# Patient Record
Sex: Male | Born: 1965 | Race: White | Hispanic: No | Marital: Married | Smoking: Former smoker
Health system: Southern US, Community
[De-identification: ages and names within clinical notes are randomized; demographics above are authoritative.]

## PROBLEM LIST (undated history)

## (undated) DIAGNOSIS — G5710 Meralgia paresthetica, unspecified lower limb: Secondary | ICD-10-CM

## (undated) DIAGNOSIS — K279 Peptic ulcer, site unspecified, unspecified as acute or chronic, without hemorrhage or perforation: Secondary | ICD-10-CM

## (undated) DIAGNOSIS — J019 Acute sinusitis, unspecified: Secondary | ICD-10-CM

## (undated) DIAGNOSIS — L989 Disorder of the skin and subcutaneous tissue, unspecified: Secondary | ICD-10-CM

## (undated) DIAGNOSIS — K219 Gastro-esophageal reflux disease without esophagitis: Secondary | ICD-10-CM

## (undated) DIAGNOSIS — F411 Generalized anxiety disorder: Secondary | ICD-10-CM

## (undated) DIAGNOSIS — C801 Malignant (primary) neoplasm, unspecified: Secondary | ICD-10-CM

## (undated) DIAGNOSIS — M545 Low back pain: Secondary | ICD-10-CM

## (undated) DIAGNOSIS — I10 Essential (primary) hypertension: Secondary | ICD-10-CM

## (undated) DIAGNOSIS — E119 Type 2 diabetes mellitus without complications: Secondary | ICD-10-CM

## (undated) DIAGNOSIS — G5600 Carpal tunnel syndrome, unspecified upper limb: Secondary | ICD-10-CM

## (undated) DIAGNOSIS — M1990 Unspecified osteoarthritis, unspecified site: Secondary | ICD-10-CM

## (undated) DIAGNOSIS — F528 Other sexual dysfunction not due to a substance or known physiological condition: Secondary | ICD-10-CM

## (undated) DIAGNOSIS — E785 Hyperlipidemia, unspecified: Secondary | ICD-10-CM

## (undated) DIAGNOSIS — T7840XA Allergy, unspecified, initial encounter: Secondary | ICD-10-CM

## (undated) DIAGNOSIS — F329 Major depressive disorder, single episode, unspecified: Secondary | ICD-10-CM

## (undated) DIAGNOSIS — M542 Cervicalgia: Secondary | ICD-10-CM

## (undated) HISTORY — DX: Type 2 diabetes mellitus without complications: E11.9

## (undated) HISTORY — DX: Major depressive disorder, single episode, unspecified: F32.9

## (undated) HISTORY — DX: Carpal tunnel syndrome, unspecified upper limb: G56.00

## (undated) HISTORY — DX: Gastro-esophageal reflux disease without esophagitis: K21.9

## (undated) HISTORY — PX: FRACTURE SURGERY: SHX138

## (undated) HISTORY — DX: Hyperlipidemia, unspecified: E78.5

## (undated) HISTORY — DX: Peptic ulcer, site unspecified, unspecified as acute or chronic, without hemorrhage or perforation: K27.9

## (undated) HISTORY — DX: Essential (primary) hypertension: I10

## (undated) HISTORY — DX: Low back pain: M54.5

## (undated) HISTORY — DX: Generalized anxiety disorder: F41.1

## (undated) HISTORY — DX: Disorder of the skin and subcutaneous tissue, unspecified: L98.9

## (undated) HISTORY — DX: Acute sinusitis, unspecified: J01.90

## (undated) HISTORY — DX: Allergy, unspecified, initial encounter: T78.40XA

## (undated) HISTORY — DX: Other sexual dysfunction not due to a substance or known physiological condition: F52.8

## (undated) HISTORY — DX: Meralgia paresthetica, unspecified lower limb: G57.10

## (undated) HISTORY — DX: Malignant (primary) neoplasm, unspecified: C80.1

## (undated) HISTORY — DX: Unspecified osteoarthritis, unspecified site: M19.90

## (undated) HISTORY — PX: WISDOM TOOTH EXTRACTION: SHX21

## (undated) HISTORY — DX: Cervicalgia: M54.2

---

## 1983-08-16 HISTORY — PX: OTHER SURGICAL HISTORY: SHX169

## 2002-05-06 ENCOUNTER — Encounter: Admission: RE | Admit: 2002-05-06 | Discharge: 2002-05-29 | Payer: Self-pay | Admitting: Internal Medicine

## 2002-06-18 ENCOUNTER — Encounter: Admission: RE | Admit: 2002-06-18 | Discharge: 2002-09-16 | Payer: Self-pay | Admitting: Internal Medicine

## 2002-12-17 ENCOUNTER — Encounter: Admission: RE | Admit: 2002-12-17 | Discharge: 2003-03-17 | Payer: Self-pay | Admitting: Internal Medicine

## 2003-04-29 ENCOUNTER — Encounter: Admission: RE | Admit: 2003-04-29 | Discharge: 2003-07-28 | Payer: Self-pay | Admitting: Internal Medicine

## 2005-04-13 ENCOUNTER — Ambulatory Visit: Payer: Self-pay | Admitting: Internal Medicine

## 2005-05-11 ENCOUNTER — Ambulatory Visit: Payer: Self-pay | Admitting: Internal Medicine

## 2005-05-17 ENCOUNTER — Ambulatory Visit: Payer: Self-pay | Admitting: Internal Medicine

## 2006-07-17 ENCOUNTER — Ambulatory Visit: Payer: Self-pay | Admitting: Internal Medicine

## 2006-07-17 LAB — CONVERTED CEMR LAB
ALT: 22 units/L (ref 0–40)
AST: 9 units/L (ref 0–37)
Albumin: 4.1 g/dL (ref 3.5–5.2)
Basophils Absolute: 0 10*3/uL (ref 0.0–0.1)
Creatinine, Ser: 0.9 mg/dL (ref 0.4–1.5)
Eosinophil percent: 2.7 % (ref 0.0–5.0)
GFR calc non Af Amer: 98 mL/min
Glomerular Filtration Rate, Af Am: 119 mL/min/{1.73_m2}
HCT: 50.9 % (ref 39.0–52.0)
LDL DIRECT: 41.6 mg/dL
Leukocytes, UA: NEGATIVE
MCHC: 33.8 g/dL (ref 30.0–36.0)
Neutro Abs: 2.8 10*3/uL (ref 1.4–7.7)
Nitrite: NEGATIVE
Platelets: 245 10*3/uL (ref 150–400)
RBC: 5.75 M/uL (ref 4.22–5.81)
RDW: 12 % (ref 11.5–14.6)
Sodium: 141 meq/L (ref 135–145)
Specific Gravity, Urine: <=1.005 (ref 1.000–1.03)
Total Bilirubin: 1.1 mg/dL (ref 0.3–1.2)
Total Protein, Urine: NEGATIVE mg/dL
Urine Glucose: >=1000 mg/dL — CR

## 2006-07-20 ENCOUNTER — Ambulatory Visit: Payer: Self-pay | Admitting: Internal Medicine

## 2006-08-28 ENCOUNTER — Ambulatory Visit: Payer: Self-pay | Admitting: Internal Medicine

## 2006-08-28 LAB — CONVERTED CEMR LAB
ALT: 22 units/L (ref 0–40)
AST: 16 units/L (ref 0–37)
Alkaline Phosphatase: 46 units/L (ref 39–117)
BUN: 10 mg/dL (ref 6–23)
Calcium: 9.4 mg/dL (ref 8.4–10.5)
GFR calc non Af Amer: 87 mL/min
Hgb A1c MFr Bld: 10.9 % — ABNORMAL HIGH (ref 4.6–6.0)
Potassium: 4.3 meq/L (ref 3.5–5.1)
Total Protein: 6.3 g/dL (ref 6.0–8.3)

## 2006-08-30 ENCOUNTER — Ambulatory Visit: Payer: Self-pay | Admitting: Internal Medicine

## 2006-09-27 ENCOUNTER — Ambulatory Visit: Payer: Self-pay | Admitting: Internal Medicine

## 2006-09-27 LAB — CONVERTED CEMR LAB
Calcium: 8.9 mg/dL (ref 8.4–10.5)
Chloride: 101 meq/L (ref 96–112)
GFR calc Af Amer: 119 mL/min
GFR calc non Af Amer: 98 mL/min
Hgb A1c MFr Bld: 8.1 % — ABNORMAL HIGH (ref 4.6–6.0)
LDL Cholesterol: 77 mg/dL (ref 0–99)
Sodium: 140 meq/L (ref 135–145)
Total CHOL/HDL Ratio: 4.3
VLDL: 37 mg/dL (ref 0–40)

## 2006-10-02 ENCOUNTER — Ambulatory Visit: Payer: Self-pay | Admitting: Internal Medicine

## 2006-12-29 ENCOUNTER — Ambulatory Visit: Payer: Self-pay | Admitting: Internal Medicine

## 2006-12-29 LAB — CONVERTED CEMR LAB
BUN: 16 mg/dL (ref 6–23)
CO2: 28 meq/L (ref 19–32)
Chloride: 106 meq/L (ref 96–112)
Creatinine, Ser: 0.9 mg/dL (ref 0.4–1.5)
HDL: 33.5 mg/dL — ABNORMAL LOW (ref >39.0)
Hgb A1c MFr Bld: 5.8 % (ref 4.6–6.0)
Potassium: 3.9 meq/L (ref 3.5–5.1)
Triglycerides: 106 mg/dL (ref 0–149)

## 2007-01-04 ENCOUNTER — Ambulatory Visit: Payer: Self-pay | Admitting: Internal Medicine

## 2007-07-05 DIAGNOSIS — F329 Major depressive disorder, single episode, unspecified: Secondary | ICD-10-CM

## 2007-07-05 DIAGNOSIS — E119 Type 2 diabetes mellitus without complications: Secondary | ICD-10-CM

## 2007-07-05 DIAGNOSIS — G5600 Carpal tunnel syndrome, unspecified upper limb: Secondary | ICD-10-CM

## 2007-07-05 DIAGNOSIS — K279 Peptic ulcer, site unspecified, unspecified as acute or chronic, without hemorrhage or perforation: Secondary | ICD-10-CM

## 2007-07-05 DIAGNOSIS — I152 Hypertension secondary to endocrine disorders: Secondary | ICD-10-CM | POA: Insufficient documentation

## 2007-07-05 DIAGNOSIS — I10 Essential (primary) hypertension: Secondary | ICD-10-CM | POA: Insufficient documentation

## 2007-07-05 DIAGNOSIS — F411 Generalized anxiety disorder: Secondary | ICD-10-CM

## 2007-07-05 DIAGNOSIS — E785 Hyperlipidemia, unspecified: Secondary | ICD-10-CM | POA: Insufficient documentation

## 2007-07-05 DIAGNOSIS — F3289 Other specified depressive episodes: Secondary | ICD-10-CM

## 2007-07-05 HISTORY — DX: Major depressive disorder, single episode, unspecified: F32.9

## 2007-07-05 HISTORY — DX: Generalized anxiety disorder: F41.1

## 2007-07-05 HISTORY — DX: Other specified depressive episodes: F32.89

## 2007-07-05 HISTORY — DX: Type 2 diabetes mellitus without complications: E11.9

## 2007-07-05 HISTORY — DX: Peptic ulcer, site unspecified, unspecified as acute or chronic, without hemorrhage or perforation: K27.9

## 2007-07-05 HISTORY — DX: Hyperlipidemia, unspecified: E78.5

## 2007-07-05 HISTORY — DX: Carpal tunnel syndrome, unspecified upper limb: G56.00

## 2007-07-05 HISTORY — DX: Essential (primary) hypertension: I10

## 2007-07-11 ENCOUNTER — Telehealth (INDEPENDENT_AMBULATORY_CARE_PROVIDER_SITE_OTHER): Payer: Self-pay | Admitting: *Deleted

## 2007-07-11 ENCOUNTER — Ambulatory Visit: Payer: Self-pay | Admitting: Internal Medicine

## 2007-07-11 DIAGNOSIS — F528 Other sexual dysfunction not due to a substance or known physiological condition: Secondary | ICD-10-CM

## 2007-07-11 DIAGNOSIS — K219 Gastro-esophageal reflux disease without esophagitis: Secondary | ICD-10-CM

## 2007-07-11 DIAGNOSIS — M545 Low back pain, unspecified: Secondary | ICD-10-CM

## 2007-07-11 HISTORY — DX: Low back pain, unspecified: M54.50

## 2007-07-11 HISTORY — DX: Gastro-esophageal reflux disease without esophagitis: K21.9

## 2007-07-11 HISTORY — DX: Other sexual dysfunction not due to a substance or known physiological condition: F52.8

## 2007-07-11 LAB — CONVERTED CEMR LAB
ALT: 26 units/L (ref 0–53)
AST: 17 units/L (ref 0–37)
Albumin: 4.1 g/dL (ref 3.5–5.2)
Basophils Absolute: 0 10*3/uL (ref 0.0–0.1)
Bilirubin Urine: NEGATIVE
Bilirubin, Direct: 0.1 mg/dL (ref 0.0–0.3)
Calcium: 9.1 mg/dL (ref 8.4–10.5)
Chloride: 107 meq/L (ref 96–112)
Cholesterol: 164 mg/dL (ref 0–200)
Creatinine,U: 255.3 mg/dL
Eosinophils Absolute: 0.1 10*3/uL (ref 0.0–0.6)
Eosinophils Relative: 2.3 % (ref 0.0–5.0)
GFR calc Af Amer: 118 mL/min
GFR calc non Af Amer: 98 mL/min
Glucose, Bld: 108 mg/dL — ABNORMAL HIGH (ref 70–99)
Hemoglobin, Urine: NEGATIVE
Hgb A1c MFr Bld: 6.4 % — ABNORMAL HIGH (ref 4.6–6.0)
Ketones, ur: NEGATIVE mg/dL
LDL Cholesterol: 113 mg/dL — ABNORMAL HIGH (ref 0–99)
Lymphocytes Relative: 22.6 % (ref 12.0–46.0)
MCHC: 35 g/dL (ref 30.0–36.0)
MCV: 90.8 fL (ref 78.0–100.0)
Microalb Creat Ratio: 3.1 mg/g (ref 0.0–30.0)
Monocytes Relative: 13 % — ABNORMAL HIGH (ref 3.0–11.0)
Neutro Abs: 3.2 10*3/uL (ref 1.4–7.7)
Nitrite: NEGATIVE
PSA: 0.28 ng/mL (ref 0.10–4.00)
Platelets: 261 10*3/uL (ref 150–400)
RBC: 5.49 M/uL (ref 4.22–5.81)
TSH: 1.09 microintl units/mL (ref 0.35–5.50)
Total CHOL/HDL Ratio: 5.3
Triglycerides: 97 mg/dL (ref 0–149)
Urine Glucose: NEGATIVE mg/dL
WBC: 5 10*3/uL (ref 4.5–10.5)

## 2007-08-14 ENCOUNTER — Telehealth: Payer: Self-pay | Admitting: Internal Medicine

## 2008-02-26 ENCOUNTER — Encounter: Payer: Self-pay | Admitting: Internal Medicine

## 2008-02-27 ENCOUNTER — Telehealth: Payer: Self-pay | Admitting: Internal Medicine

## 2008-03-13 ENCOUNTER — Ambulatory Visit: Payer: Self-pay | Admitting: Internal Medicine

## 2008-03-13 LAB — CONVERTED CEMR LAB
Chloride: 105 meq/L (ref 96–112)
GFR calc Af Amer: 104 mL/min
GFR calc non Af Amer: 86 mL/min
LDL Cholesterol: 96 mg/dL (ref 0–99)
Potassium: 4.5 meq/L (ref 3.5–5.1)
Sodium: 139 meq/L (ref 135–145)
VLDL: 21 mg/dL (ref 0–40)

## 2008-03-18 ENCOUNTER — Ambulatory Visit: Payer: Self-pay | Admitting: Internal Medicine

## 2008-03-18 DIAGNOSIS — M542 Cervicalgia: Secondary | ICD-10-CM

## 2008-03-18 DIAGNOSIS — L989 Disorder of the skin and subcutaneous tissue, unspecified: Secondary | ICD-10-CM | POA: Insufficient documentation

## 2008-03-18 HISTORY — DX: Cervicalgia: M54.2

## 2008-03-18 HISTORY — DX: Disorder of the skin and subcutaneous tissue, unspecified: L98.9

## 2008-08-15 DIAGNOSIS — C4491 Basal cell carcinoma of skin, unspecified: Secondary | ICD-10-CM

## 2008-08-15 HISTORY — DX: Basal cell carcinoma of skin, unspecified: C44.91

## 2008-09-22 ENCOUNTER — Ambulatory Visit: Payer: Self-pay | Admitting: Internal Medicine

## 2008-09-22 LAB — CONVERTED CEMR LAB
ALT: 29 units/L (ref 0–53)
Basophils Absolute: 0.1 10*3/uL (ref 0.0–0.1)
Basophils Relative: 1.7 % (ref 0.0–3.0)
CO2: 27 meq/L (ref 19–32)
Calcium: 9 mg/dL (ref 8.4–10.5)
Cholesterol: 135 mg/dL (ref 0–200)
Creatinine, Ser: 1 mg/dL (ref 0.4–1.5)
Creatinine,U: 159.1 mg/dL
GFR calc Af Amer: 104 mL/min
HCT: 47.5 % (ref 39.0–52.0)
HDL: 30.4 mg/dL — ABNORMAL LOW (ref >39.0)
Hemoglobin: 16.9 g/dL (ref 13.0–17.0)
Ketones, ur: NEGATIVE mg/dL
Lymphocytes Relative: 28.2 % (ref 12.0–46.0)
MCHC: 35.6 g/dL (ref 30.0–36.0)
MCV: 90.2 fL (ref 78.0–100.0)
Microalb, Ur: 0.5 mg/dL (ref 0.0–1.9)
Monocytes Absolute: 0.5 10*3/uL (ref 0.1–1.0)
Neutro Abs: 3.6 10*3/uL (ref 1.4–7.7)
PSA: 0.51 ng/mL (ref 0.10–4.00)
RBC: 5.26 M/uL (ref 4.22–5.81)
Specific Gravity, Urine: >=1.03 (ref 1.000–1.03)
Total Bilirubin: 0.5 mg/dL (ref 0.3–1.2)
Total Protein, Urine: NEGATIVE mg/dL
Total Protein: 6.4 g/dL (ref 6.0–8.3)
Triglycerides: 99 mg/dL (ref 0–149)
Urine Glucose: NEGATIVE mg/dL
pH: 5 (ref 5.0–8.0)

## 2008-09-25 ENCOUNTER — Ambulatory Visit: Payer: Self-pay | Admitting: Internal Medicine

## 2008-09-25 DIAGNOSIS — G5710 Meralgia paresthetica, unspecified lower limb: Secondary | ICD-10-CM

## 2008-09-25 HISTORY — DX: Meralgia paresthetica, unspecified lower limb: G57.10

## 2009-03-24 ENCOUNTER — Ambulatory Visit: Payer: Self-pay | Admitting: Internal Medicine

## 2009-03-24 LAB — CONVERTED CEMR LAB
BUN: 19 mg/dL (ref 6–23)
CO2: 27 meq/L (ref 19–32)
Chloride: 106 meq/L (ref 96–112)
Creatinine, Ser: 1 mg/dL (ref 0.4–1.5)
LDL Cholesterol: 75 mg/dL (ref 0–99)
Total CHOL/HDL Ratio: 4

## 2009-03-26 ENCOUNTER — Ambulatory Visit: Payer: Self-pay | Admitting: Internal Medicine

## 2009-03-26 DIAGNOSIS — J019 Acute sinusitis, unspecified: Secondary | ICD-10-CM

## 2009-03-26 HISTORY — DX: Acute sinusitis, unspecified: J01.90

## 2009-05-01 ENCOUNTER — Encounter: Payer: Self-pay | Admitting: Internal Medicine

## 2009-05-11 ENCOUNTER — Telehealth: Payer: Self-pay | Admitting: Internal Medicine

## 2009-07-07 ENCOUNTER — Telehealth: Payer: Self-pay | Admitting: Internal Medicine

## 2009-08-15 HISTORY — PX: BASAL CELL CARCINOMA EXCISION: SHX1214

## 2009-09-18 ENCOUNTER — Ambulatory Visit: Payer: Self-pay | Admitting: Internal Medicine

## 2009-09-18 LAB — CONVERTED CEMR LAB
ALT: 19 units/L (ref 0–53)
AST: 17 units/L (ref 0–37)
Albumin: 4.1 g/dL (ref 3.5–5.2)
BUN: 15 mg/dL (ref 6–23)
Basophils Relative: 0 % (ref 0.0–3.0)
Chloride: 107 meq/L (ref 96–112)
Creatinine,U: 232 mg/dL
Eosinophils Relative: 2.6 % (ref 0.0–5.0)
Glucose, Bld: 119 mg/dL — ABNORMAL HIGH (ref 70–99)
Hemoglobin, Urine: NEGATIVE
Hgb A1c MFr Bld: 6.4 % (ref 4.6–6.5)
Leukocytes, UA: NEGATIVE
Lymphocytes Relative: 19 % (ref 12.0–46.0)
Neutrophils Relative %: 67.9 % (ref 43.0–77.0)
Nitrite: NEGATIVE
Potassium: 4.4 meq/L (ref 3.5–5.1)
RBC: 5.5 M/uL (ref 4.22–5.81)
TSH: 0.81 microintl units/mL (ref 0.35–5.50)
Total Bilirubin: 0.6 mg/dL (ref 0.3–1.2)
Total CHOL/HDL Ratio: 4
Total Protein, Urine: NEGATIVE mg/dL
Urobilinogen, UA: 0.2 (ref 0.0–1.0)
VLDL: 18.8 mg/dL (ref 0.0–40.0)
WBC: 5.6 10*3/uL (ref 4.5–10.5)

## 2009-09-24 ENCOUNTER — Ambulatory Visit: Payer: Self-pay | Admitting: Internal Medicine

## 2010-06-25 ENCOUNTER — Telehealth: Payer: Self-pay | Admitting: Internal Medicine

## 2010-06-28 ENCOUNTER — Telehealth: Payer: Self-pay | Admitting: Internal Medicine

## 2010-09-10 ENCOUNTER — Telehealth: Payer: Self-pay | Admitting: Internal Medicine

## 2010-09-14 ENCOUNTER — Encounter: Payer: Self-pay | Admitting: Internal Medicine

## 2010-09-16 NOTE — Progress Notes (Signed)
Summary: medication refill  Phone Note Refill Request Message from:  Fax from Pharmacy on June 25, 2010 4:27 PM  Refills Requested: Medication #1:  ALPRAZOLAM 1 MG  TABS 1/2 to 1 by mouth once daily as needed   Dosage confirmed as above?Dosage Confirmed   Last Refilled: 09/24/2009   Notes: [facility] [address] [phone] Initial call taken by: Zella Ball Ewing CMA Duncan Dull),  June 25, 2010 4:27 PM  Follow-up for Phone Call        too soon - was given one yr refill in feb 2011 Follow-up by: Corwin Levins MD,  June 25, 2010 4:45 PM  Additional Follow-up for Phone Call Additional follow up Details #1::        left message on home machine for pt to return my call, work number is incorrect. Additional Follow-up by: Margaret Pyle, CMA,  June 25, 2010 4:53 PM    Additional Follow-up for Phone Call Additional follow up Details #2::    Pharmacy informed via Faxed Rx request Follow-up by: Margaret Pyle, CMA,  June 28, 2010 9:04 AM

## 2010-09-16 NOTE — Assessment & Plan Note (Signed)
Summary: cpx / $50 Natale Milch   Vital Signs:  Patient profile:   45 year old male Height:      71 inches Weight:      291.50 pounds BMI:     40.80 O2 Sat:      96 % on Room air Temp:     97.3 degrees F oral Pulse rate:   94 / minute BP sitting:   134 / 88  (left arm) Cuff size:   large  Vitals Entered ByZella Ball Ewing (September 24, 2009 10:05 AM)  O2 Flow:  Room air  CC: adult physical/re   CC:  adult physical/re.  History of Present Illness: lost almost 15 lbs i n the past yr;  only has low sugar every 2 to 3 wks, trying to lose more wt;  did better with wrist splints over the last year but owrn out now;  Pt denies CP, sob, doe, wheezing, orthopnea, pnd, worsening LE edema, palps, dizziness or syncope   Pt denies new neuro symptoms such as headache, facial or extremity weakness  Pt denies polydipsia, polyuria, or low sugar symptoms such as shakiness improved with eating.  Overall good compliance with meds, trying to follow low chol, DM diet, wt stable, little excercise however   Problems Prior to Update: 1)  Sinusitis- Acute-nos  (ICD-461.9) 2)  Meralgia Paresthetica  (ICD-355.1) 3)  Preventive Health Care  (ICD-V70.0) 4)  Skin Lesion  (ICD-709.9) 5)  Cervicalgia  (ICD-723.1) 6)  Erectile Dysfunction  (ICD-302.72) 7)  Preventive Health Care  (ICD-V70.0) 8)  Gerd  (ICD-530.81) 9)  Low Back Pain  (ICD-724.2) 10)  Peptic Ulcer Disease  (ICD-533.90) 11)  Family History Diabetes 1st Degree Relative  (ICD-V18.0) 12)  Depression  (ICD-311) 13)  Anxiety  (ICD-300.00) 14)  Carpal Tunnel Syndrome, Bilateral  (ICD-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 Take 1 Tablet By Mouth Once A Day 2)  Glimepiride 4 Mg Tabs (Glimepiride) .... Take 1 Tablet By Mouth Once A Day 3)  Metformin Hcl 500 Mg Tb24 (Metformin Hcl) .... Take 2 Tablet By  Mouth Once A Day 4)  Onetouch Test  Strp (Glucose Blood) .... Use 1 Strip Twice A Day 5)  Target Lancet Super Thin 30g  Misc (Lancets) .... Use 1 Stick As Directed Twice A Day 6)  Crestor 40 Mg  Tabs (Rosuvastatin Calcium) .Marland Kitchen.. 1 By Mouth Once Daily 7)  Alprazolam 1 Mg  Tabs (Alprazolam) .... 1/2 To 1 By Mouth Three Times A Day As Needed 8)  Ecotrin Low Strength 81 Mg  Tbec (Aspirin) .Marland Kitchen.. 1 By Mouth Qd 9)  Azor 10-40 Mg  Tabs (Amlodipine-Olmesartan) .Marland Kitchen.. 1 By Mouth Once Daily 10)  Cialis 20 Mg  Tabs (Tadalafil) .Marland Kitchen.. 1 By Mouth Once Daily As Needed 11)  Azithromycin 250 Mg Tabs (Azithromycin) .... 2po Qd For 1 Day, Then 1po Qd For 4days, Then Stop 12)  Cetirizine Hcl 10 Mg Tabs (Cetirizine Hcl) .Marland Kitchen.. 1po Once Daily As Needed Allergies  Current Medications (verified): 1)  Actos 45 Mg Tabs (Pioglitazone Hcl) .... Take 1 Tablet By Mouth Once A Day 2)  Glimepiride 4 Mg Tabs (Glimepiride) .... Take 1 Tablet By Mouth Once A Day 3)  Metformin Hcl 500 Mg Tb24 (Metformin Hcl) .... Take 2 Tablet By Mouth Once A Day 4)  Onetouch Test  Strp (Glucose Blood) .Marland KitchenMarland KitchenMarland Kitchen  Use 1 Strip Twice A Day 5)  Target Lancet Super Thin 30g  Misc (Lancets) .... Use 1 Stick As Directed Twice A Day 6)  Crestor 40 Mg  Tabs (Rosuvastatin Calcium) .Marland Kitchen.. 1 By Mouth Once Daily 7)  Alprazolam 1 Mg  Tabs (Alprazolam) .... 1/2 To 1 By Mouth Once Daily As Needed 8)  Ecotrin Low Strength 81 Mg  Tbec (Aspirin) .Marland Kitchen.. 1 By Mouth Qd 9)  Azor 10-40 Mg  Tabs (Amlodipine-Olmesartan) .Marland Kitchen.. 1 By Mouth Once Daily 10)  Cialis 20 Mg  Tabs (Tadalafil) .Marland Kitchen.. 1 By Mouth Once Daily As Needed 11)  Fluticasone Propionate 50 Mcg/act Susp (Fluticasone Propionate) .... 2 Spray/side Once Daily 12)  Carisoprodol 350 Mg Tabs (Carisoprodol) .Marland Kitchen.. 1 Tab As Needed Once Daily  Allergies (verified): No Known Drug Allergies  Past History:  Past Medical History: Last updated: 07/11/2007 Diabetes mellitus, type II Hyperlipidemia Hypertension bilat carpal tunnel  syndrome Anxiety Depression Peptic ulcer disease Low back pain recurrent neck pain GERD  Past Surgical History: Last updated: 07/11/2007 left wrist surgury/fracture n- 1985  Family History: Last updated: 07/11/2007 Family History Diabetes 1st degree relative Family History Hypertension grandfather with colon cancer grandmother with heart disease  Social History: Last updated: 09/25/2008 Alcohol use-yes - soc Current Smoker - occasional cigar 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         all otherwise negative per pt -except still mild bilat hand and wrist symptoms c/w his recurrent cts  Physical Exam  General:  alert and overweight-appearing.   Head:  normocephalic and atraumatic.   Eyes:  vision grossly intact, pupils equal, and pupils round.   Ears:  R ear normal and L ear normal.   Nose:  no external deformity and no nasal discharge.   Mouth:  no gingival abnormalities and pharynx pink and moist.   Neck:  supple and no masses.   Lungs:  normal respiratory effort and normal breath sounds.   Heart:  normal rate and regular rhythm.   Abdomen:  soft, non-tender, and normal bowel sounds.   Msk:  no joint tenderness and no joint swelling.   Extremities:  no edema, no erythema  Neurologic:  cranial nerves II-XII intact and strength normal in all extremities.     Impression & Recommendations:  Problem # 1:   Preventive Health Care (ICD-V70.0) Overall doing well, age appropriate education and counseling updated and referral for appropriate preventive services done unless declined, immunizations up to date or declined, diet counseling done if overweight, urged to quit smoking if smokes , most recent labs reviewed and current ordered if appropriate, ecg reviewed or declined (interpretation per ECG scanned in the EMR if done); information regarding Medicare Prevention requirements given if appropriate   Problem # 2:  CARPAL TUNNEL SYNDROME, BILATERAL (ICD-354.0)  still mild symptoms ; for repeat bialt wrist splints  Orders: Ankle / Wrist Splint (A4570)  Problem # 3:  DIABETES MELLITUS, TYPE II (ICD-250.00)  His updated medication list for this problem includes:    Actos 45 Mg Tabs (Pioglitazone hcl) .Marland Kitchen... Take 1 tablet by mouth once a  day    Glimepiride 4 Mg Tabs (Glimepiride) .Marland Kitchen... Take 1 tablet by mouth once a day    Metformin Hcl 500 Mg Tb24 (Metformin hcl) .Marland Kitchen... Take 2 tablet by mouth once a day    Ecotrin Low Strength 81 Mg Tbec (Aspirin) .Marland Kitchen... 1 by mouth qd    Azor 10-40 Mg Tabs (Amlodipine-olmesartan) .Marland Kitchen... 1 by mouth once daily  Labs Reviewed: Creat: 0.9 (09/18/2009)    Reviewed HgBA1c results: 6.4 (09/18/2009)  6.6 (03/24/2009) treat as above, f/u any worsening signs or symptoms   Complete Medication List: 1)  Actos 45 Mg Tabs (Pioglitazone hcl) .... Take 1 tablet by mouth once a day 2)  Glimepiride 4 Mg Tabs (Glimepiride) .... Take 1 tablet by mouth once a day 3)  Metformin Hcl 500 Mg Tb24 (Metformin hcl) .... Take 2 tablet by mouth once a day 4)  Onetouch Test Strp (Glucose blood) .... Use 1 strip twice a day 5)  Target Lancet Super Thin 30g Misc (Lancets) .... Use 1 stick as directed twice a day 6)  Crestor 40 Mg Tabs (Rosuvastatin calcium) .Marland Kitchen.. 1 by mouth once daily 7)  Alprazolam 1 Mg Tabs (Alprazolam) .... 1/2 to 1 by mouth once daily as needed 8)  Ecotrin Low Strength 81 Mg  Tbec (Aspirin) .Marland Kitchen.. 1 by mouth qd 9)  Azor 10-40 Mg Tabs (Amlodipine-olmesartan) .Marland Kitchen.. 1 by mouth once daily 10)  Cialis 20 Mg Tabs (Tadalafil) .Marland Kitchen.. 1 by mouth once daily as needed 11)  Fluticasone Propionate 50 Mcg/act Susp (Fluticasone propionate) .... 2 spray/side once daily 12)  Carisoprodol 350 Mg Tabs (Carisoprodol) .Marland Kitchen.. 1 tab as needed once daily  Other Orders: Admin 1st Vaccine (08657) Flu Vaccine 56yrs + (84696)  Patient Instructions: 1)  you had the flu shot today 2)  you are givent the wrist splints for both wrists today 3)  Continue all previous medications as before this visit  4)  Please schedule a follow-up appointment in 6 months with: 5)  BMP prior to visit, ICD-9: 250.02 6)  Lipid Panel prior to visit, ICD-9: 7)  HbgA1C prior to visit, ICD-9: Prescriptions: CIALIS 20 MG  TABS (TADALAFIL) 1 by mouth once daily as needed  #30 x 11   Entered and Authorized by:   Corwin Levins MD   Signed by:   Corwin Levins MD on 09/24/2009   Method used:   Print then Give to Patient   RxID:   [id number] AZOR 10-40 MG  TABS (AMLODIPINE-OLMESARTAN) 1 by mouth once daily  #90 x 3   Entered and Authorized by:   Corwin Levins MD   Signed by:   Corwin Levins MD on 09/24/2009   Method used:   Print then Give to Patient   RxID:   [id number] CRESTOR 40 MG  TABS (ROSUVASTATIN CALCIUM) 1 by mouth once daily  #90 x 3   Entered and Authorized by:   Corwin Levins MD   Signed by:   Corwin Levins MD on 09/24/2009   Method used:   Print then Give to Patient   RxID:   [id number] TARGET LANCET SUPER THIN 30G  MISC (LANCETS) Use 1 stick as directed twice a day  #200 x 11   Entered and Authorized by:   Corwin Levins MD   Signed by:   Corwin Levins MD on 09/24/2009   Method used:   Print then Give to Patient   RxID:   [id number] ONETOUCH TEST  STRP (GLUCOSE  BLOOD) Use 1 strip twice a day  #200 x 11   Entered and Authorized by:   Corwin Levins MD   Signed by:   Corwin Levins MD on  09/24/2009   Method used:   Print then Give to Patient   RxID:   [id number] METFORMIN HCL 500 MG TB24 (METFORMIN HCL) Take 2 tablet by mouth once a day  #180 x 3   Entered and Authorized by:   Corwin Levins MD   Signed by:   Corwin Levins MD on 09/24/2009   Method used:   Print then Give to Patient   RxID:   [id number] GLIMEPIRIDE 4 MG TABS (GLIMEPIRIDE) Take 1 tablet by mouth once a day  #90 x 3   Entered and Authorized by:   Corwin Levins MD   Signed by:   Corwin Levins MD on 09/24/2009   Method used:   Print then Give to Patient   RxID:   [id number] ACTOS 45 MG TABS (PIOGLITAZONE HCL) Take 1 tablet by mouth once a day  #90 x 3   Entered and Authorized by:   Corwin Levins MD   Signed by:   Corwin Levins MD on 09/24/2009   Method used:   Print then Give to Patient   RxID:   [id number] CARISOPRODOL 350 MG TABS (CARISOPRODOL) 1 tab as needed once daily  #90 x 3   Entered and Authorized by:   Corwin Levins MD   Signed by:   Corwin Levins MD on 09/24/2009   Method used:   Print then Give to Patient   RxID:   [id number] ALPRAZOLAM 1 MG  TABS (ALPRAZOLAM) 1/2 to 1 by mouth once daily as needed  #90 x 3   Entered and Authorized by:   Corwin Levins MD   Signed by:   Corwin Levins MD on 09/24/2009   Method used:   Print then Give to Patient   RxID:   [id number] FLUTICASONE PROPIONATE 50 MCG/ACT SUSP (FLUTICASONE PROPIONATE) 2 spray/side once daily  #3 x 3   Entered and Authorized by:   Corwin Levins MD   Signed by:   Corwin Levins MD on 09/24/2009   Method used:   Print then Give to Patient   RxID:   [id number]    Flu Vaccine Consent Questions     Do you have a history of severe allergic reactions to this vaccine? no    Any prior history of allergic reactions to egg and/or gelatin? no    Do you have a sensitivity to the preservative Thimersol? no    Do you have a past history of Guillan-Barre Syndrome? no    Do you currently have an acute febrile  illness? no    Have you ever had a severe reaction to latex? no    Vaccine information given and explained to patient? yes    Are you currently pregnant? no    Lot Number:[id number]   Exp Date:02/11/2010   Site Given  Left Deltoid IMbflu

## 2010-09-16 NOTE — Progress Notes (Signed)
Summary: Rx refill req  Phone Note Refill Request Message from:  Patient on June 28, 2010 1:31 PM  Refills Requested: Medication #1:  METFORMIN HCL 500 MG TB24 Take 2 tablet by mouth once a day   Dosage confirmed as above?Dosage Confirmed   Supply Requested: 3 months Pt sent original Rx to [facility] abut has since lost [facility] and [facility].   Method Requested: Electronic Initial call taken by: Margaret Pyle, CMA,  June 28, 2010 1:32 PM    Prescriptions: METFORMIN HCL 500 MG TB24 (METFORMIN HCL) Take 2 tablet by mouth once a day  #180 x 1   Entered by:   Margaret Pyle, CMA   Authorized by:   Corwin Levins MD   Signed by:   Margaret Pyle, CMA on 06/28/2010   Method used:   [facility]ronically [facility]        [facility] [address] [phone]* (retail)       [address]       [place], [place]  [address]       Ph: [phone]       Fax: [phone]   RxID:   [id number]

## 2010-09-22 NOTE — Progress Notes (Signed)
Summary: Req letter  Phone Note Call from Patient Call back at Home Phone [phone]   Caller: Patient Summary of Call: Pt called stating he is trying to apply for Medical Ins through [facility] and a letter from MD stating pt's pre-existing condition is needed. Initial call taken by: Margaret Pyle, CMA,  September 10, 2010 1:30 PM  Follow-up for Phone Call        this would be unusual -  could he send the written information that indicates this need to use for review? Follow-up by: Corwin Levins MD,  September 10, 2010 5:37 PM  Additional Follow-up for Phone Call Additional follow up Details #1::        Pt advised and will request a copy of request be faxed to [facility] attn Additional Follow-up by: Margaret Pyle, CMA,  September 14, 2010 8:30 AM

## 2010-09-22 NOTE — Letter (Signed)
Summary: Generic Letter  [facility]  [address] [place], [place] [address]   Phone: [phone]  Fax: [phone]    09/14/2010  Jeffrey [name] [address] [place], [place]  [address]  Dear [name],      This letter is per your request to certify for  insurance purposes that you have indeed been diagnosed  the Hypertension, Type II (2) Diabetes Mellitus, and  Hyperlipidemia.     Sincerely,   Oliver Barre MD

## 2010-09-30 NOTE — Letter (Signed)
Summary: Request a letter from Physician/Patient  Request a letter from Physician/Patient   Imported By: Sherian Rein 09/21/2010 06:57:35  _____________________________________________________________________  External Attachment:    Type:   Image     Comment:   External Document

## 2010-10-13 ENCOUNTER — Telehealth: Payer: Self-pay | Admitting: Internal Medicine

## 2010-10-21 NOTE — Progress Notes (Signed)
Summary: Rx refill req  Phone Note Call from Patient   Caller: Patient [phone] Summary of Call: Pt called requesting rx refill until appt 05/17 Initial call taken by: Margaret Pyle, CMA,  October 13, 2010 3:44 PM    Prescriptions: CARISOPRODOL 350 MG TABS (CARISOPRODOL) 1 tab as needed once daily  #90 x 1   Entered by:   Margaret Pyle, CMA   Authorized by:   Corwin Levins MD   Signed by:   Margaret Pyle, CMA on 10/13/2010   Method used:   Electronically to        [facility] [address] [phone]* (retail)       [address]       [place], [place]  [address]       Ph: [phone]       Fax: [phone]   RxID:   [id number] FLUTICASONE PROPIONATE 50 MCG/ACT SUSP (FLUTICASONE PROPIONATE) 2 spray/side once daily  #3 x 1   Entered by:   Margaret Pyle, CMA   Authorized by:   Corwin Levins MD   Signed by:   Margaret Pyle, CMA on 10/13/2010   Method used:   [facility]ronically [facility]        [facility] [address] [phone]* (retail)       [address]       [place], [place]  [address]       Ph: [phone]       Fax: [phone]   RxID:   [id number] CIALIS 20 MG  TABS (TADALAFIL) 1 by mouth once daily as needed  #30 x 2   Entered by:   Margaret Pyle, CMA   Authorized by:   Corwin Levins MD   Signed by:   Margaret Pyle, CMA on 10/13/2010   Method used:   Electronically to        [facility] [address] [phone]* (retail)       [address]       [place], [place]  [address]       Ph: [phone]       Fax: [phone]   RxID:   [id number] AZOR 10-40 MG  TABS (AMLODIPINE-OLMESARTAN) 1 by mouth once daily  #90 x 1   Entered by:   Margaret Pyle, CMA   Authorized by:   Corwin Levins MD   Signed by:   Margaret Pyle, CMA on 10/13/2010   Method used:   Electronically to        [facility] [address] [id number] (retail)       [address]  [place], [place]  [address]       Ph: [phone]       Fax: [phone]   RxID:   [id number] CRESTOR 40 MG  TABS (ROSUVASTATIN CALCIUM) 1 by mouth once daily  #90 x 1   Entered by:   Margaret Pyle, CMA   Authorized by:   Corwin Levins MD   Signed by:   Margaret Pyle, CMA on 10/13/2010   Method used:   Electronically to        [facility] [address] [phone]* (retail)       [address] [facility]  [place], [place]  [address]       Ph: [phone]       Fax: [phone]   RxID:   [id number] TARGET LANCET SUPER THIN 30G  MISC (LANCETS) Use 1 stick as directed twice a day  #200 x 2   Entered by:   Margaret Pyle, CMA   Authorized by:   Corwin Levins MD   Signed by:   Margaret Pyle, CMA on 10/13/2010   Method used:   Electronically to        [facility] [address] [phone]* (retail)       [address]       [place], [place]  [address]       Ph: [phone]       Fax: [phone]   RxID:   [id number] [facility] TEST  STRP (GLUCOSE BLOOD) Use 1 strip twice a day  #200 x 2   Entered by:   Margaret Pyle, CMA   Authorized by:   Corwin Levins MD   Signed by:   Margaret Pyle, CMA on 10/13/2010   Method used:   Electronically to        [facility] [address] [phone]* (retail)       [address]       [place], [place]  [address]       Ph: [phone]       Fax: [phone]   RxID:   [id number] ACTOS 45 MG TABS (PIOGLITAZONE HCL) Take 1 tablet by mouth once a day  #90 x 1   Entered by:   Margaret Pyle, CMA   Authorized by:   Corwin Levins MD   Signed by:   Margaret Pyle, CMA on 10/13/2010   Method used:   Electronically to        [facility] [address] [phone]* (retail)       [address]       [place], [place]  [address]       Ph: [phone]       Fax: [phone]   RxID:   [id number] GLIMEPIRIDE 4 MG TABS  (GLIMEPIRIDE) Take 1 tablet by mouth once a day  #90 x 1   Entered by:   Margaret Pyle, CMA   Authorized by:   Corwin Levins MD   Signed by:   Margaret Pyle, CMA on 10/13/2010   Method used:   Electronically to        [facility] [address] [phone]* (retail)       [address]       [place], [place]  [address]       Ph: [phone]       Fax: [phone]   RxID:   [id number]

## 2010-12-20 ENCOUNTER — Other Ambulatory Visit: Payer: Self-pay | Admitting: Internal Medicine

## 2010-12-20 ENCOUNTER — Other Ambulatory Visit (INDEPENDENT_AMBULATORY_CARE_PROVIDER_SITE_OTHER): Payer: PRIVATE HEALTH INSURANCE

## 2010-12-20 DIAGNOSIS — Z0000 Encounter for general adult medical examination without abnormal findings: Secondary | ICD-10-CM

## 2010-12-20 DIAGNOSIS — Z0389 Encounter for observation for other suspected diseases and conditions ruled out: Secondary | ICD-10-CM

## 2010-12-20 LAB — HEPATIC FUNCTION PANEL
AST: 18 U/L (ref 0–37)
Albumin: 3.9 g/dL (ref 3.5–5.2)
Total Bilirubin: 0.7 mg/dL (ref 0.3–1.2)

## 2010-12-20 LAB — CBC WITH DIFFERENTIAL/PLATELET
Basophils Relative: 0.3 % (ref 0.0–3.0)
Eosinophils Relative: 3.2 % (ref 0.0–5.0)
HCT: 46.7 % (ref 39.0–52.0)
Hemoglobin: 16.6 g/dL (ref 13.0–17.0)
Lymphocytes Relative: 15.8 % (ref 12.0–46.0)
Lymphs Abs: 1 10*3/uL (ref 0.7–4.0)
Monocytes Relative: 8.9 % (ref 3.0–12.0)
Neutro Abs: 4.7 10*3/uL (ref 1.4–7.7)
RBC: 5.14 Mil/uL (ref 4.22–5.81)
RDW: 13.1 % (ref 11.5–14.6)
WBC: 6.5 10*3/uL (ref 4.5–10.5)

## 2010-12-20 LAB — BASIC METABOLIC PANEL
BUN: 13 mg/dL (ref 6–23)
CO2: 25 mEq/L (ref 19–32)
Calcium: 8.6 mg/dL (ref 8.4–10.5)
GFR: 100.02 mL/min (ref >60.00)
Glucose, Bld: 114 mg/dL — ABNORMAL HIGH (ref 70–99)

## 2010-12-20 LAB — TSH: TSH: 0.72 u[IU]/mL (ref 0.35–5.50)

## 2010-12-20 LAB — LIPID PANEL
Total CHOL/HDL Ratio: 4
VLDL: 7.6 mg/dL (ref 0.0–40.0)

## 2010-12-20 LAB — URINALYSIS
Hgb urine dipstick: NEGATIVE
Leukocytes, UA: NEGATIVE
Nitrite: NEGATIVE
Total Protein, Urine: NEGATIVE

## 2010-12-26 ENCOUNTER — Encounter: Payer: Self-pay | Admitting: Internal Medicine

## 2010-12-26 DIAGNOSIS — Z0000 Encounter for general adult medical examination without abnormal findings: Secondary | ICD-10-CM | POA: Insufficient documentation

## 2010-12-26 DIAGNOSIS — Z0001 Encounter for general adult medical examination with abnormal findings: Secondary | ICD-10-CM | POA: Insufficient documentation

## 2010-12-27 ENCOUNTER — Encounter: Payer: Self-pay | Admitting: Internal Medicine

## 2010-12-27 ENCOUNTER — Ambulatory Visit (INDEPENDENT_AMBULATORY_CARE_PROVIDER_SITE_OTHER): Payer: PRIVATE HEALTH INSURANCE | Admitting: Internal Medicine

## 2010-12-27 VITALS — BP 112/70 | HR 86 | Temp 98.6°F | Ht 72.0 in | Wt 289.0 lb

## 2010-12-27 DIAGNOSIS — C4491 Basal cell carcinoma of skin, unspecified: Secondary | ICD-10-CM | POA: Insufficient documentation

## 2010-12-27 DIAGNOSIS — M25511 Pain in right shoulder: Secondary | ICD-10-CM

## 2010-12-27 DIAGNOSIS — Z0000 Encounter for general adult medical examination without abnormal findings: Secondary | ICD-10-CM

## 2010-12-27 DIAGNOSIS — M79609 Pain in unspecified limb: Secondary | ICD-10-CM

## 2010-12-27 DIAGNOSIS — M25519 Pain in unspecified shoulder: Secondary | ICD-10-CM

## 2010-12-27 DIAGNOSIS — M79641 Pain in right hand: Secondary | ICD-10-CM

## 2010-12-27 DIAGNOSIS — E119 Type 2 diabetes mellitus without complications: Secondary | ICD-10-CM

## 2010-12-27 MED ORDER — METFORMIN HCL ER 500 MG PO TB24: 500.0000 mg | ORAL_TABLET | Freq: Two times a day (BID) | ORAL | Status: DC

## 2010-12-27 MED ORDER — PIOGLITAZONE HCL 45 MG PO TABS: 45.0000 mg | ORAL_TABLET | Freq: Every day | ORAL | Status: DC

## 2010-12-27 MED ORDER — ALPRAZOLAM 1 MG PO TABS: 1.0000 mg | ORAL_TABLET | Freq: Every day | ORAL | Status: DC | PRN

## 2010-12-27 MED ORDER — AMLODIPINE-OLMESARTAN 10-40 MG PO TABS: 1.0000 | ORAL_TABLET | Freq: Every day | ORAL | Status: DC

## 2010-12-27 MED ORDER — TADALAFIL 20 MG PO TABS: 20.0000 mg | ORAL_TABLET | Freq: Every day | ORAL | Status: DC | PRN

## 2010-12-27 MED ORDER — GLIMEPIRIDE 4 MG PO TABS: 4.0000 mg | ORAL_TABLET | Freq: Every day | ORAL | Status: DC

## 2010-12-27 MED ORDER — GLUCOSE BLOOD VI STRP: ORAL_STRIP | Status: DC

## 2010-12-27 MED ORDER — CARISOPRODOL 350 MG PO TABS: 350.0000 mg | ORAL_TABLET | Freq: Every day | ORAL | Status: DC | PRN

## 2010-12-27 MED ORDER — TGT LANCET SUPER THIN 30G MISC: Status: DC

## 2010-12-27 MED ORDER — FLUTICASONE PROPIONATE 50 MCG/ACT NA SUSP: 2.0000 | Freq: Every day | NASAL | Status: DC

## 2010-12-27 MED ORDER — ROSUVASTATIN CALCIUM 40 MG PO TABS: 40.0000 mg | ORAL_TABLET | Freq: Every day | ORAL | Status: DC

## 2010-12-27 NOTE — Assessment & Plan Note (Signed)
C/w prob bursitis/rotater cuff - to ortho for further evalaution and tx

## 2010-12-27 NOTE — Assessment & Plan Note (Signed)
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

## 2010-12-27 NOTE — Assessment & Plan Note (Signed)
Due for yearly f/u - to refer to derm

## 2010-12-27 NOTE — Patient Instructions (Addendum)
Continue all other medications as before You will be contacted regarding the referral for: Hand surgeon for the right hand, orthopedic for the other joints, and dermatoloty Please return in 6 mo with Lab testing done 3-5 days before All your medications were refilled today

## 2010-12-27 NOTE — Assessment & Plan Note (Signed)

## 2010-12-27 NOTE — Progress Notes (Signed)
Subjective:    Patient ID: Jeffrey [name], male    DOB: 1966-01-30, 45 y.o.   MRN: [id number]  HPI  Here for wellness and f/u;  Overall doing ok;  Pt denies CP, worsening SOB, DOE, wheezing, orthopnea, PND, worsening LE edema, palpitations, dizziness or syncope.  Pt denies neurological change such as new Headache, facial or extremity weakness.  Pt denies polydipsia, polyuria, or low sugar symptoms. Pt states overall good compliance with treatment and medications, good tolerability, and trying to follow lower cholesterol diet.  Pt denies worsening depressive symptoms, suicidal ideation or panic. No fever, wt loss, night sweats, loss of appetite, or other constitutional symptoms.  Pt states good ability with ADL's, low fall risk, home safety reviewed and adequate, no significant changes in hearing or vision, and occasionally active with exercise.  Struggling to get back in good physical shape , but has been out of meds for over 4 mo mid 2011, but now back to work, and back on [employer] for last 4-5 months.  No working day hours  - 9-5, works as Theatre manager for [employer]. Past Medical History  Diagnosis Date  . DIABETES MELLITUS, TYPE II 07/05/2007  . HYPERLIPIDEMIA 07/05/2007  . ANXIETY 07/05/2007  . ERECTILE DYSFUNCTION 07/11/2007  . DEPRESSION 07/05/2007  . CARPAL TUNNEL SYNDROME, BILATERAL 07/05/2007  . Meralgia paresthetica 09/25/2008  . HYPERTENSION 07/05/2007  . SINUSITIS- ACUTE-NOS 03/26/2009  . GERD 07/11/2007  . PEPTIC ULCER DISEASE 07/05/2007  . SKIN LESION 03/18/2008  . Cervicalgia 03/18/2008  . LOW BACK PAIN 07/11/2007   Past Surgical History  Procedure Date  . Left wrist surgury/fracture 1985    reports that he has been smoking Cigars.  He does not have any smokeless tobacco history on file. He reports that he drinks alcohol. His drug history not on file. family history includes Cancer in his maternal grandfather; Diabetes in his other; Heart disease in his  maternal grandmother; and Hypertension in his other. Allergies  Allergen Reactions  . Codeine Itching   Current Outpatient Prescriptions on File Prior to Visit  Medication Sig Dispense Refill  . ALPRAZolam (XANAX) 1 MG tablet 1/2 to 1 by mouth once daily as needed       . amLODipine-olmesartan (AZOR) 10-40 MG per tablet Take 1 tablet by mouth daily.        Marland Kitchen aspirin 81 MG tablet Take 81 mg by mouth daily.        . carisoprodol (SOMA) 350 MG tablet Take 350 mg by mouth daily as needed.        . fluticasone (FLONASE) 50 MCG/ACT nasal spray 2 sprays by Nasal route daily.        Marland Kitchen glimepiride (AMARYL) 4 MG tablet Take 4 mg by mouth daily.        Marland Kitchen glucose blood (ONE TOUCH TEST STRIPS) test strip 2 (two) times daily. Use as instructed       . metFORMIN (GLUCOPHAGE-XR) 500 MG 24 hr tablet Take 500 mg by mouth 2 (two) times daily.        . pioglitazone (ACTOS) 45 MG tablet Take 45 mg by mouth daily.        . rosuvastatin (CRESTOR) 40 MG tablet Take 40 mg by mouth daily.        . tadalafil (CIALIS) 20 MG tablet Take 20 mg by mouth daily as needed.        . Target Lancet Super Thin 30G MISC 2 (two) times daily.  Review of Systems Review of Systems  Constitutional: Negative for diaphoresis, activity change, appetite change and unexpected weight change.  HENT: Negative for hearing loss, ear pain, facial swelling, mouth sores and neck stiffness.   Eyes: Negative for pain, redness and visual disturbance.  Respiratory: Negative for shortness of breath and wheezing.   Cardiovascular: Negative for chest pain and palpitations.  Gastrointestinal: Negative for diarrhea, blood in stool, abdominal distention and rectal pain.  Genitourinary: Negative for hematuria, flank pain and decreased urine volume.  Musculoskeletal: Negative for myalgias and joint swelling.  Skin: Negative for color change and wound.  Neurological: Negative for syncope and numbness.  Hematological: Negative for adenopathy.    Psychiatric/Behavioral: Negative for hallucinations, self-injury, decreased concentration and agitation.      Objective:   Physical Exam BP 112/70  Pulse 86  Temp(Src) 98.6 F (37 C) (Oral)  Ht 6' (1.829 m)  Wt 289 lb (131.09 kg)  BMI 39.20 kg/m2  SpO2 96% Physical Exam  VS noted Constitutional: Pt is oriented to person, place, and time. Appears well-developed and well-nourished.  HENT:  Head: Normocephalic and atraumatic.  Right Ear: External ear normal.  Left Ear: External ear normal.  Nose: Nose normal.  Mouth/Throat: Oropharynx is clear and moist.  Eyes: Conjunctivae and EOM are normal. Pupils are equal, round, and reactive to light.  Neck: Normal range of motion. Neck supple. No JVD present. No tracheal deviation present.  Cardiovascular: Normal rate, regular rhythm, normal heart sounds and intact distal pulses.   Pulmonary/Chest: Effort normal and breath sounds normal.  Abdominal: Soft. Bowel sounds are normal. There is no tenderness.  Musculoskeletal: Normal range of motion. Exhibits no edema.  Lymphadenopathy:  Has no cervical adenopathy.  Neurological: Pt is alert and oriented to person, place, and time. Pt has normal reflexes. No cranial nerve deficit.  Skin: Skin is warm and dry. No rash noted.  Psychiatric:  Has  normal mood and affect. Behavior is normal.         Assessment & Plan:

## 2010-12-30 ENCOUNTER — Other Ambulatory Visit: Payer: Self-pay | Admitting: Internal Medicine

## 2011-01-21 ENCOUNTER — Other Ambulatory Visit: Payer: Self-pay

## 2011-01-21 MED ORDER — METFORMIN HCL ER 500 MG PO TB24: 500.0000 mg | ORAL_TABLET | Freq: Two times a day (BID) | ORAL | Status: DC

## 2011-04-24 ENCOUNTER — Other Ambulatory Visit: Payer: Self-pay | Admitting: Internal Medicine

## 2011-05-02 ENCOUNTER — Other Ambulatory Visit: Payer: Self-pay | Admitting: Internal Medicine

## 2011-06-29 ENCOUNTER — Other Ambulatory Visit (INDEPENDENT_AMBULATORY_CARE_PROVIDER_SITE_OTHER): Payer: PRIVATE HEALTH INSURANCE

## 2011-06-29 DIAGNOSIS — E119 Type 2 diabetes mellitus without complications: Secondary | ICD-10-CM

## 2011-06-29 LAB — BASIC METABOLIC PANEL
Chloride: 107 mEq/L (ref 96–112)
Creatinine, Ser: 0.9 mg/dL (ref 0.4–1.5)
GFR: 101.14 mL/min (ref >60.00)
Potassium: 4.2 mEq/L (ref 3.5–5.1)

## 2011-06-29 LAB — LIPID PANEL
LDL Cholesterol: 58 mg/dL (ref 0–99)
Total CHOL/HDL Ratio: 3
Triglycerides: 81 mg/dL (ref 0.0–149.0)
VLDL: 16.2 mg/dL (ref 0.0–40.0)

## 2011-07-04 ENCOUNTER — Encounter: Payer: Self-pay | Admitting: Internal Medicine

## 2011-07-04 ENCOUNTER — Ambulatory Visit (INDEPENDENT_AMBULATORY_CARE_PROVIDER_SITE_OTHER): Payer: PRIVATE HEALTH INSURANCE | Admitting: Internal Medicine

## 2011-07-04 VITALS — BP 122/62 | HR 103 | Temp 98.4°F | Ht 72.0 in | Wt 298.1 lb

## 2011-07-04 DIAGNOSIS — Z23 Encounter for immunization: Secondary | ICD-10-CM

## 2011-07-04 DIAGNOSIS — I10 Essential (primary) hypertension: Secondary | ICD-10-CM

## 2011-07-04 DIAGNOSIS — E785 Hyperlipidemia, unspecified: Secondary | ICD-10-CM

## 2011-07-04 DIAGNOSIS — F411 Generalized anxiety disorder: Secondary | ICD-10-CM

## 2011-07-04 DIAGNOSIS — Z0000 Encounter for general adult medical examination without abnormal findings: Secondary | ICD-10-CM

## 2011-07-04 DIAGNOSIS — E119 Type 2 diabetes mellitus without complications: Secondary | ICD-10-CM

## 2011-07-04 MED ORDER — AMLODIPINE-OLMESARTAN 10-40 MG PO TABS: 1.0000 | ORAL_TABLET | Freq: Every day | ORAL | Status: DC

## 2011-07-04 MED ORDER — PIOGLITAZONE HCL 45 MG PO TABS: 45.0000 mg | ORAL_TABLET | Freq: Every day | ORAL | Status: DC

## 2011-07-04 MED ORDER — TADALAFIL 20 MG PO TABS: 20.0000 mg | ORAL_TABLET | Freq: Every day | ORAL | Status: DC | PRN

## 2011-07-04 MED ORDER — GLIMEPIRIDE 4 MG PO TABS: 4.0000 mg | ORAL_TABLET | Freq: Every day | ORAL | Status: DC

## 2011-07-04 MED ORDER — ESCITALOPRAM OXALATE 10 MG PO TABS: 10.0000 mg | ORAL_TABLET | Freq: Every day | ORAL | Status: DC

## 2011-07-04 MED ORDER — ATORVASTATIN CALCIUM 80 MG PO TABS: 80.0000 mg | ORAL_TABLET | Freq: Every day | ORAL | Status: DC

## 2011-07-04 MED ORDER — ALPRAZOLAM 1 MG PO TABS: ORAL_TABLET | ORAL | Status: DC

## 2011-07-04 MED ORDER — CARISOPRODOL 350 MG PO TABS: 350.0000 mg | ORAL_TABLET | Freq: Every day | ORAL | Status: DC | PRN

## 2011-07-04 MED ORDER — METFORMIN HCL ER 500 MG PO TB24: ORAL_TABLET | ORAL | Status: DC

## 2011-07-04 MED ORDER — ALPRAZOLAM 1 MG PO TABS: 1.0000 mg | ORAL_TABLET | Freq: Two times a day (BID) | ORAL | Status: DC | PRN

## 2011-07-04 NOTE — Assessment & Plan Note (Addendum)
stable overall by hx and exam, most recent data reviewed with pt, and pt to continue medical treatment as before except crestor too expensive, will try change to lipitor 80  Lab Results  Component Value Date   LDLCALC 58 06/29/2011

## 2011-07-04 NOTE — Patient Instructions (Signed)
OK to take the alprazolam twice per day as needed Start also the lexapro generic 10 mg per day OK to stop the crestor Please start the generic for lipitor 80 mg per day Increase the metformin to 3 pills per day Continue all other medications as before Please return in 6 mo with Lab testing done 3-5 days before

## 2011-07-04 NOTE — Assessment & Plan Note (Signed)
Increassed, to incr the xanax to bid prn, also add lexapro 10 qd ,  to f/u any worsening symptoms or concerns

## 2011-07-04 NOTE — Progress Notes (Signed)
Subjective:    Patient ID: Jeffrey [name], male    DOB: 03-Apr-1966, 45 y.o.   MRN: [id number]  HPI  Here to f/u; overall doing ok,  Pt denies chest pain, increased sob or doe, wheezing, orthopnea, PND, increased LE swelling, palpitations, dizziness or syncope.  Pt denies new neurological symptoms such as new headache, or facial or extremity weakness or numbness   Pt denies polydipsia, polyuria, or low sugar symptoms such as weakness or confusion improved with po intake.  Pt states overall good compliance with meds, trying to follow lower cholesterol, diabetic diet, wt overall up 9 lbs in the past 6 mo, but little exercise however.  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  Denies worsening depressive symptoms, suicidal ideation, or panic, though has ongoing anxiety as above.  More tension, ? Social anxiety. If takes the xanax during day seems to help tremendously, last over 4 hrs but then wears off.  Past Medical History  Diagnosis Date  . DIABETES MELLITUS, TYPE II 07/05/2007  . HYPERLIPIDEMIA 07/05/2007  . ANXIETY 07/05/2007  . ERECTILE DYSFUNCTION 07/11/2007  . DEPRESSION 07/05/2007  . CARPAL TUNNEL SYNDROME, BILATERAL 07/05/2007  . Meralgia paresthetica 09/25/2008  . HYPERTENSION 07/05/2007  . SINUSITIS- ACUTE-NOS 03/26/2009  . GERD 07/11/2007  . PEPTIC ULCER DISEASE 07/05/2007  . SKIN LESION 03/18/2008  . Cervicalgia 03/18/2008  . LOW BACK PAIN 07/11/2007   Past Surgical History  Procedure Date  . Left wrist surgury/fracture 1985    reports that he has been smoking Cigars.  He does not have any smokeless tobacco history on file. He reports that he drinks alcohol. His drug history not on file. family history includes Cancer in his maternal grandfather; Diabetes in his other; Heart disease in his maternal grandmother; and Hypertension in  his other. Allergies  Allergen Reactions  . Codeine Itching   Current Outpatient Prescriptions on File Prior to Visit  Medication Sig Dispense Refill  . ACTOS 45 MG tablet TAKE 1 TABLET EVERY DAY  90 tablet  0  . ALPRAZolam (XANAX) 1 MG tablet Take 1 tablet (1 mg total) by mouth daily as needed for sleep. 1/2 to 1 by mouth once daily as needed  30 tablet  3  . aspirin 81 MG tablet Take 81 mg by mouth daily.        . AZOR 10-40 MG per tablet TAKE 1 TABLET EVERY DAY  90 tablet  0  . carisoprodol (SOMA) 350 MG tablet Take 1 tablet (350 mg total) by mouth daily as needed.  30 tablet  3  . CRESTOR 40 MG tablet TAKE 1 TABLET EVERY DAY  90 tablet  0  . fluticasone (FLONASE) 50 MCG/ACT nasal spray 2 sprays by Nasal route daily.  16 g  3  . glimepiride (AMARYL) 4 MG tablet TAKE 1 TABLET EVERY DAY  90 tablet  0  . glucose blood (ONE TOUCH TEST STRIPS) test strip Use as instructed  100 each  33  . metFORMIN (GLUCOPHAGE-XR) 500 MG 24 hr tablet Take 1 tablet (500 mg total) by mouth 2 (two) times daily.  180 tablet  3  . tadalafil (CIALIS) 20 MG tablet Take 1 tablet (20 mg total) by mouth daily as needed.  10 tablet  11  . Target Lancet Super Thin 30G MISC Use as directed 1 per day  100  each  11    Review of Systems Review of Systems  Constitutional: Negative for diaphoresis and unexpected weight change.  HENT: Negative for drooling and tinnitus.   Eyes: Negative for photophobia and visual disturbance.  Respiratory: Negative for choking and stridor.   Gastrointestinal: Negative for vomiting and blood in stool.  Genitourinary: Negative for hematuria and decreased urine volume.      Objective:   Physical Exam BP 122/62  Pulse 103  Temp(Src) 98.4 F (36.9 C) (Oral)  Ht 6' (1.829 m)  Wt 298 lb 2 oz (135.229 kg)  BMI 40.43 kg/m2  SpO2 94% Physical Exam  VS noted Constitutional: Pt appears well-developed and well-nourished.  HENT: Head: Normocephalic.  Right Ear: External ear normal.  Left  Ear: External ear normal.  Eyes: Conjunctivae and EOM are normal. Pupils are equal, round, and reactive to light.  Neck: Normal range of motion. Neck supple.  Cardiovascular: Normal rate and regular rhythm.   Pulmonary/Chest: Effort normal and breath sounds normal.  Abd:  Soft, NT, non-distended, + BS Neurological: Pt is alert. No cranial nerve deficit.  Skin: Skin is warm. No erythema.  Psychiatric: Pt behavior is normal. Thought content normal. 1-2+ nervous    Assessment & Plan:

## 2011-07-04 NOTE — Assessment & Plan Note (Signed)
stable overall by hx and exam but to incr the metformin to 3 per day, most recent data reviewed with pt, and pt to continue other medical treatment as before  Lab Results  Component Value Date   HGBA1C 7.1* 06/29/2011

## 2011-07-04 NOTE — Assessment & Plan Note (Signed)
.  stable overall by hx and exam, most recent data reviewed with pt, and pt to continue medical treatment as before  BP Readings from Last 3 Encounters:  07/04/11 122/62  12/27/10 112/70  09/24/09 134/88

## 2011-08-30 ENCOUNTER — Telehealth: Payer: Self-pay

## 2011-08-30 NOTE — Telephone Encounter (Signed)
Pharmacy requesting to change Atorvastatin 80 mg to Lipitor 40 mg 2 qd # 60, please advise [facility] [address]

## 2011-08-30 NOTE — Telephone Encounter (Signed)
Ok with me - robin to handle 

## 2011-08-31 MED ORDER — ATORVASTATIN CALCIUM 40 MG PO TABS: ORAL_TABLET | ORAL | Status: DC

## 2011-10-10 ENCOUNTER — Telehealth: Payer: Self-pay | Admitting: *Deleted

## 2011-10-10 MED ORDER — METFORMIN HCL ER 500 MG PO TB24: 500.0000 mg | ORAL_TABLET | Freq: Three times a day (TID) | ORAL | Status: DC

## 2011-10-10 NOTE — Telephone Encounter (Signed)
Called pt back no answer LMOM [facility] [address]... 10/10/11@4 :52pm/LMB

## 2011-10-10 NOTE — Telephone Encounter (Signed)
Pt left msg stating md increase his metformin to three times a day. Needing new script sent to his pharmacy... 10/10/11@4 :35pm/LMB

## 2011-12-26 ENCOUNTER — Other Ambulatory Visit: Payer: Self-pay

## 2011-12-26 MED ORDER — CARISOPRODOL 350 MG PO TABS: 350.0000 mg | ORAL_TABLET | Freq: Every day | ORAL | Status: DC | PRN

## 2011-12-26 NOTE — Telephone Encounter (Signed)
Done hardcopy to robin  

## 2011-12-26 NOTE — Telephone Encounter (Signed)
Faxed hardcopy to pharmacy. 

## 2011-12-28 ENCOUNTER — Other Ambulatory Visit: Payer: Self-pay

## 2011-12-28 DIAGNOSIS — F411 Generalized anxiety disorder: Secondary | ICD-10-CM

## 2011-12-28 MED ORDER — PIOGLITAZONE HCL 45 MG PO TABS: 45.0000 mg | ORAL_TABLET | Freq: Every day | ORAL | Status: DC

## 2011-12-28 MED ORDER — AMLODIPINE-OLMESARTAN 10-40 MG PO TABS: 1.0000 | ORAL_TABLET | Freq: Every day | ORAL | Status: DC

## 2011-12-28 MED ORDER — GLIMEPIRIDE 4 MG PO TABS: 4.0000 mg | ORAL_TABLET | Freq: Every day | ORAL | Status: DC

## 2011-12-28 MED ORDER — CARISOPRODOL 350 MG PO TABS: 350.0000 mg | ORAL_TABLET | Freq: Every day | ORAL | Status: DC | PRN

## 2011-12-28 MED ORDER — ALPRAZOLAM 1 MG PO TABS: ORAL_TABLET | ORAL | Status: DC

## 2011-12-28 MED ORDER — ESCITALOPRAM OXALATE 10 MG PO TABS: 10.0000 mg | ORAL_TABLET | Freq: Every day | ORAL | Status: DC

## 2011-12-28 NOTE — Telephone Encounter (Signed)
Done per emr 

## 2011-12-28 NOTE — Telephone Encounter (Signed)
Patient called requesting refills on some of his medications as is almost out and was scheduled next week for appt. , but had to reschedule until July as MD is out of town. Please advise

## 2011-12-28 NOTE — Telephone Encounter (Signed)
Called the patient informed his wife prescriptions requested have been sent to the pharmacy. Faxed hardcopy to pharmacy

## 2012-01-03 ENCOUNTER — Encounter: Payer: PRIVATE HEALTH INSURANCE | Admitting: Internal Medicine

## 2012-02-15 ENCOUNTER — Other Ambulatory Visit: Payer: Self-pay | Admitting: *Deleted

## 2012-02-15 ENCOUNTER — Other Ambulatory Visit (INDEPENDENT_AMBULATORY_CARE_PROVIDER_SITE_OTHER): Payer: PRIVATE HEALTH INSURANCE

## 2012-02-15 DIAGNOSIS — Z0000 Encounter for general adult medical examination without abnormal findings: Secondary | ICD-10-CM

## 2012-02-15 DIAGNOSIS — E119 Type 2 diabetes mellitus without complications: Secondary | ICD-10-CM

## 2012-02-15 LAB — CBC WITH DIFFERENTIAL/PLATELET
Basophils Absolute: 0 10*3/uL (ref 0.0–0.1)
Eosinophils Relative: 4.3 % (ref 0.0–5.0)
HCT: 52.1 % — ABNORMAL HIGH (ref 39.0–52.0)
Lymphocytes Relative: 20 % (ref 12.0–46.0)
Lymphs Abs: 1.5 10*3/uL (ref 0.7–4.0)
Monocytes Relative: 13.6 % — ABNORMAL HIGH (ref 3.0–12.0)
Neutrophils Relative %: 61.6 % (ref 43.0–77.0)
Platelets: 234 10*3/uL (ref 150.0–400.0)
RDW: 14 % (ref 11.5–14.6)
WBC: 7.5 10*3/uL (ref 4.5–10.5)

## 2012-02-15 LAB — URINALYSIS, ROUTINE W REFLEX MICROSCOPIC
Ketones, ur: NEGATIVE
Specific Gravity, Urine: 1.025 (ref 1.000–1.030)
Urine Glucose: NEGATIVE
Urobilinogen, UA: 1 (ref 0.0–1.0)
pH: 6 (ref 5.0–8.0)

## 2012-02-15 LAB — TSH: TSH: 1.42 u[IU]/mL (ref 0.35–5.50)

## 2012-02-15 LAB — LDL CHOLESTEROL, DIRECT: Direct LDL: 76.1 mg/dL

## 2012-02-15 LAB — HEPATIC FUNCTION PANEL
AST: 13 U/L (ref 0–37)
Alkaline Phosphatase: 63 U/L (ref 39–117)
Bilirubin, Direct: 0.1 mg/dL (ref 0.0–0.3)
Total Protein: 6.5 g/dL (ref 6.0–8.3)

## 2012-02-15 LAB — LIPID PANEL
Cholesterol: 137 mg/dL (ref 0–200)
Total CHOL/HDL Ratio: 5
Triglycerides: 253 mg/dL — ABNORMAL HIGH (ref 0.0–149.0)

## 2012-02-15 LAB — BASIC METABOLIC PANEL
CO2: 25 mEq/L (ref 19–32)
Calcium: 9 mg/dL (ref 8.4–10.5)
Chloride: 104 mEq/L (ref 96–112)
Glucose, Bld: 122 mg/dL — ABNORMAL HIGH (ref 70–99)
Potassium: 4.4 mEq/L (ref 3.5–5.1)
Sodium: 138 mEq/L (ref 135–145)

## 2012-02-15 LAB — PSA: PSA: 0.31 ng/mL (ref 0.10–4.00)

## 2012-02-16 ENCOUNTER — Encounter: Payer: Self-pay | Admitting: Internal Medicine

## 2012-02-23 ENCOUNTER — Ambulatory Visit (INDEPENDENT_AMBULATORY_CARE_PROVIDER_SITE_OTHER): Payer: PRIVATE HEALTH INSURANCE | Admitting: Internal Medicine

## 2012-02-23 ENCOUNTER — Encounter: Payer: Self-pay | Admitting: Internal Medicine

## 2012-02-23 DIAGNOSIS — I10 Essential (primary) hypertension: Secondary | ICD-10-CM

## 2012-02-23 DIAGNOSIS — Z0000 Encounter for general adult medical examination without abnormal findings: Secondary | ICD-10-CM

## 2012-02-23 DIAGNOSIS — E119 Type 2 diabetes mellitus without complications: Secondary | ICD-10-CM

## 2012-02-23 MED ORDER — TADALAFIL 20 MG PO TABS: 20.0000 mg | ORAL_TABLET | Freq: Every day | ORAL | Status: DC | PRN

## 2012-02-23 MED ORDER — AMLODIPINE BESYLATE 10 MG PO TABS: 10.0000 mg | ORAL_TABLET | Freq: Every day | ORAL | Status: DC

## 2012-02-23 MED ORDER — IRBESARTAN 300 MG PO TABS: 300.0000 mg | ORAL_TABLET | Freq: Every day | ORAL | Status: DC

## 2012-02-23 NOTE — Patient Instructions (Addendum)
OK to stop the Azor due to the cost Please start the amlodipine 10 mg per day Please start the generic for Avapro 300 mg per day Continue all other medications as before You are otherwise up to date with prevention today Please continue your efforts at being more active, low cholesterol diabetic diet, and weight control. Please return in 6 mo with Lab testing done 3-5 days before

## 2012-02-23 NOTE — Progress Notes (Deleted)
  Subjective:    Patient ID: Jeffrey [name], male    DOB: Jan 09, 1966, 46 y.o.   MRN: [id number]  HPI    Review of Systems     Objective:   Physical Exam        Assessment & Plan:

## 2012-02-23 NOTE — Assessment & Plan Note (Signed)

## 2012-02-23 NOTE — Assessment & Plan Note (Signed)
Ok to adjust azor to generic

## 2012-02-23 NOTE — Addendum Note (Signed)
Addended by: Corwin Levins on: 02/23/2012 10:42 AM   Modules accepted: Level of Service

## 2012-02-23 NOTE — Assessment & Plan Note (Signed)
stable overall by hx and exam, most recent data reviewed with pt, and pt to continue medical treatment as before, actos is generic now and I encouraged him to go to different pharmacy for better price

## 2012-02-23 NOTE — Progress Notes (Signed)
Subjective:    Patient ID: Jeffrey [name], male    DOB: 08-22-1965, 46 y.o.   MRN: [id number]  HPI  Here for wellness and f/u;  Overall doing ok;  Pt denies CP, worsening SOB, DOE, wheezing, orthopnea, PND, worsening LE edema, palpitations, dizziness or syncope.  Pt denies neurological change such as new Headache, facial or extremity weakness.  Pt denies polydipsia, polyuria, or low sugar symptoms. Pt states overall good compliance with treatment and medications, good tolerability, and trying to follow lower cholesterol diet.  Pt denies worsening depressive symptoms, suicidal ideation or panic. No fever, wt loss, night sweats, loss of appetite, or other constitutional symptoms.  Pt states good ability with ADL's, low fall risk, home safety reviewed and adequate, no significant changes in hearing or vision, and occasionally active with exercise.  Actos with his plan costs $115 /mo and Azor is $175.   Past Medical History  Diagnosis Date  . DIABETES MELLITUS, TYPE II 07/05/2007  . HYPERLIPIDEMIA 07/05/2007  . ANXIETY 07/05/2007  . ERECTILE DYSFUNCTION 07/11/2007  . DEPRESSION 07/05/2007  . CARPAL TUNNEL SYNDROME, BILATERAL 07/05/2007  . Meralgia paresthetica 09/25/2008  . HYPERTENSION 07/05/2007  . SINUSITIS- ACUTE-NOS 03/26/2009  . GERD 07/11/2007  . PEPTIC ULCER DISEASE 07/05/2007  . SKIN LESION 03/18/2008  . Cervicalgia 03/18/2008  . LOW BACK PAIN 07/11/2007   Past Surgical History  Procedure Date  . Left wrist surgury/fracture 1985    reports that he has been smoking Cigars.  He has never used smokeless tobacco. He reports that he drinks alcohol. His drug history not on file. family history includes Cancer in his maternal grandfather; Diabetes in his other; Heart disease in his maternal grandmother; and Hypertension in his other. Allergies  Allergen Reactions  . Codeine Itching   Current Outpatient Prescriptions on File Prior to Visit  Medication Sig Dispense Refill  . ALPRAZolam  (XANAX) 1 MG tablet 1/2 to 1 by mouth twice per day as needed  60 tablet  5  . aspirin 81 MG tablet Take 81 mg by mouth daily.        Marland Kitchen atorvastatin (LIPITOR) 40 MG tablet Take 2 by mouth every day  60 tablet  11  . carisoprodol (SOMA) 350 MG tablet Take 1 tablet (350 mg total) by mouth daily as needed.  30 tablet  5  . escitalopram (LEXAPRO) 10 MG tablet Take 1 tablet (10 mg total) by mouth daily.  30 tablet  11  . fluticasone (FLONASE) 50 MCG/ACT nasal spray 2 sprays by Nasal route daily.  16 g  3  . glimepiride (AMARYL) 4 MG tablet Take 1 tablet (4 mg total) by mouth daily.  30 tablet  11  . glucose blood (ONE TOUCH TEST STRIPS) test strip Use as instructed  100 each  33  . metFORMIN (GLUCOPHAGE-XR) 500 MG 24 hr tablet Take 1 tablet (500 mg total) by mouth 3 (three) times daily. 3 tabs by mouth per day  270 tablet  3  . tadalafil (CIALIS) 20 MG tablet Take 1 tablet (20 mg total) by mouth daily as needed.  10 tablet  11  . Target Lancet Super Thin 30G MISC Use as directed 1 per day  100 each  11  . amLODipine-olmesartan (AZOR) 10-40 MG per tablet Take 1 tablet by mouth daily.  30 tablet  11  . pioglitazone (ACTOS) 45 MG tablet Take 1 tablet (45 mg total) by mouth daily.  30 tablet  11   Review of Systems Review  of Systems  Constitutional: Negative for diaphoresis, activity change, appetite change and unexpected weight change.  HENT: Negative for hearing loss, ear pain, facial swelling, mouth sores and neck stiffness.   Eyes: Negative for pain, redness and visual disturbance.  Respiratory: Negative for shortness of breath and wheezing.   Cardiovascular: Negative for chest pain and palpitations.  Gastrointestinal: Negative for diarrhea, blood in stool, abdominal distention and rectal pain.  Genitourinary: Negative for hematuria, flank pain and decreased urine volume.  Musculoskeletal: Negative for myalgias and joint swelling.  Skin: Negative for color change and wound.  Neurological:  Negative for syncope and numbness.  Hematological: Negative for adenopathy.  Psychiatric/Behavioral: Negative for hallucinations, self-injury, decreased concentration and agitation.     Objective:   Physical Exam There were no vitals taken for this visit. Physical Exam  VS noted Constitutional: Pt is oriented to person, place, and time. Appears well-developed and well-nourished.  HENT:  Head: Normocephalic and atraumatic.  Right Ear: External ear normal.  Left Ear: External ear normal.  Nose: Nose normal.  Mouth/Throat: Oropharynx is clear and moist.  Eyes: Conjunctivae and EOM are normal. Pupils are equal, round, and reactive to light.  Neck: Normal range of motion. Neck supple. No JVD present. No tracheal deviation present.  Cardiovascular: Normal rate, regular rhythm, normal heart sounds and intact distal pulses.   Pulmonary/Chest: Effort normal and breath sounds normal.  Abdominal: Soft. Bowel sounds are normal. There is no tenderness.  Musculoskeletal: Normal range of motion. Exhibits no edema.  Lymphadenopathy:  Has no cervical adenopathy.  Neurological: Pt is alert and oriented to person, place, and time. Pt has normal reflexes. No cranial nerve deficit.  Skin: Skin is warm and dry. No rash noted.  Psychiatric:  Has  normal mood and affect. Behavior is normal.     Assessment & Plan:

## 2012-04-25 ENCOUNTER — Other Ambulatory Visit: Payer: Self-pay

## 2012-04-25 DIAGNOSIS — F411 Generalized anxiety disorder: Secondary | ICD-10-CM

## 2012-04-25 MED ORDER — IRBESARTAN 300 MG PO TABS: 300.0000 mg | ORAL_TABLET | Freq: Every day | ORAL | Status: DC

## 2012-04-25 MED ORDER — METFORMIN HCL ER 500 MG PO TB24: 500.0000 mg | ORAL_TABLET | Freq: Three times a day (TID) | ORAL | Status: DC

## 2012-04-25 MED ORDER — ATORVASTATIN CALCIUM 40 MG PO TABS: ORAL_TABLET | ORAL | Status: DC

## 2012-04-25 MED ORDER — AMLODIPINE BESYLATE 10 MG PO TABS: 10.0000 mg | ORAL_TABLET | Freq: Every day | ORAL | Status: DC

## 2012-04-25 MED ORDER — GLIMEPIRIDE 4 MG PO TABS: 4.0000 mg | ORAL_TABLET | Freq: Every day | ORAL | Status: DC

## 2012-04-25 MED ORDER — PIOGLITAZONE HCL 45 MG PO TABS: 45.0000 mg | ORAL_TABLET | Freq: Every day | ORAL | Status: DC

## 2012-04-25 MED ORDER — ESCITALOPRAM OXALATE 10 MG PO TABS: 10.0000 mg | ORAL_TABLET | Freq: Every day | ORAL | Status: DC

## 2012-08-02 ENCOUNTER — Other Ambulatory Visit: Payer: Self-pay | Admitting: Internal Medicine

## 2012-08-03 NOTE — Telephone Encounter (Signed)
Done hardcopy to robin  

## 2012-08-03 NOTE — Telephone Encounter (Signed)
Faxed hardcopy to pharmacy. 

## 2012-08-28 ENCOUNTER — Ambulatory Visit: Payer: PRIVATE HEALTH INSURANCE | Admitting: Internal Medicine

## 2012-11-19 ENCOUNTER — Other Ambulatory Visit (INDEPENDENT_AMBULATORY_CARE_PROVIDER_SITE_OTHER): Payer: PRIVATE HEALTH INSURANCE

## 2012-11-19 ENCOUNTER — Telehealth: Payer: Self-pay

## 2012-11-19 ENCOUNTER — Ambulatory Visit: Payer: PRIVATE HEALTH INSURANCE

## 2012-11-19 DIAGNOSIS — R7309 Other abnormal glucose: Secondary | ICD-10-CM

## 2012-11-19 DIAGNOSIS — Z0000 Encounter for general adult medical examination without abnormal findings: Secondary | ICD-10-CM

## 2012-11-19 LAB — URINALYSIS, ROUTINE W REFLEX MICROSCOPIC
Bilirubin Urine: NEGATIVE
Ketones, ur: 40
Total Protein, Urine: NEGATIVE
Urine Glucose: >=1000
pH: 6 (ref 5.0–8.0)

## 2012-11-19 LAB — HEPATIC FUNCTION PANEL
AST: 19 U/L (ref 0–37)
Albumin: 4.1 g/dL (ref 3.5–5.2)
Alkaline Phosphatase: 78 U/L (ref 39–117)
Bilirubin, Direct: 0.1 mg/dL (ref 0.0–0.3)

## 2012-11-19 LAB — CBC WITH DIFFERENTIAL/PLATELET
Eosinophils Relative: 2 % (ref 0.0–5.0)
MCV: 89.8 fl (ref 78.0–100.0)
Monocytes Absolute: 0.9 10*3/uL (ref 0.1–1.0)
Monocytes Relative: 13.1 % — ABNORMAL HIGH (ref 3.0–12.0)
Neutrophils Relative %: 69 % (ref 43.0–77.0)
Platelets: 196 10*3/uL (ref 150.0–400.0)
WBC: 6.8 10*3/uL (ref 4.5–10.5)

## 2012-11-19 LAB — LIPID PANEL
HDL: 24.4 mg/dL — ABNORMAL LOW (ref >39.00)
Total CHOL/HDL Ratio: 10
Triglycerides: 500 mg/dL — ABNORMAL HIGH (ref 0.0–149.0)
VLDL: 100 mg/dL — ABNORMAL HIGH (ref 0.0–40.0)

## 2012-11-19 LAB — BASIC METABOLIC PANEL
GFR: 106.23 mL/min (ref >60.00)
Glucose, Bld: 272 mg/dL — ABNORMAL HIGH (ref 70–99)
Potassium: 4.8 mEq/L (ref 3.5–5.1)
Sodium: 134 mEq/L — ABNORMAL LOW (ref 135–145)

## 2012-11-19 LAB — PSA: PSA: 0.33 ng/mL (ref 0.10–4.00)

## 2012-11-19 NOTE — Telephone Encounter (Signed)
Labs entered.

## 2012-11-23 ENCOUNTER — Encounter: Payer: Self-pay | Admitting: Internal Medicine

## 2012-11-23 ENCOUNTER — Ambulatory Visit (INDEPENDENT_AMBULATORY_CARE_PROVIDER_SITE_OTHER): Payer: No Typology Code available for payment source | Admitting: Internal Medicine

## 2012-11-23 VITALS — BP 152/80 | HR 93 | Temp 99.2°F | Ht 72.0 in | Wt 258.1 lb

## 2012-11-23 DIAGNOSIS — E119 Type 2 diabetes mellitus without complications: Secondary | ICD-10-CM

## 2012-11-23 DIAGNOSIS — Z0000 Encounter for general adult medical examination without abnormal findings: Secondary | ICD-10-CM

## 2012-11-23 DIAGNOSIS — F411 Generalized anxiety disorder: Secondary | ICD-10-CM

## 2012-11-23 MED ORDER — TADALAFIL 20 MG PO TABS: 20.0000 mg | ORAL_TABLET | Freq: Every day | ORAL | Status: DC | PRN

## 2012-11-23 MED ORDER — GLIMEPIRIDE 4 MG PO TABS: 4.0000 mg | ORAL_TABLET | Freq: Every day | ORAL | Status: DC

## 2012-11-23 MED ORDER — AMLODIPINE BESYLATE 10 MG PO TABS: 10.0000 mg | ORAL_TABLET | Freq: Every day | ORAL | Status: DC

## 2012-11-23 MED ORDER — METFORMIN HCL ER 500 MG PO TB24: 500.0000 mg | ORAL_TABLET | Freq: Three times a day (TID) | ORAL | Status: DC

## 2012-11-23 MED ORDER — ATORVASTATIN CALCIUM 40 MG PO TABS: ORAL_TABLET | ORAL | Status: DC

## 2012-11-23 MED ORDER — PIOGLITAZONE HCL 30 MG PO TABS: 30.0000 mg | ORAL_TABLET | Freq: Every day | ORAL | Status: DC

## 2012-11-23 MED ORDER — ESCITALOPRAM OXALATE 10 MG PO TABS: 10.0000 mg | ORAL_TABLET | Freq: Every day | ORAL | Status: DC

## 2012-11-23 MED ORDER — FLUTICASONE PROPIONATE 50 MCG/ACT NA SUSP: 2.0000 | Freq: Every day | NASAL | Status: DC

## 2012-11-23 MED ORDER — IRBESARTAN 300 MG PO TABS: 300.0000 mg | ORAL_TABLET | Freq: Every day | ORAL | Status: DC

## 2012-11-23 NOTE — Assessment & Plan Note (Signed)
stable overall by history and exam, and pt to continue medical treatment as before,  to f/u any worsening symptoms or concerns 

## 2012-11-23 NOTE — Assessment & Plan Note (Signed)

## 2012-11-23 NOTE — Progress Notes (Signed)
Subjective:    Patient ID: Jeffrey [name], male    DOB: 1966/03/02, 47 y.o.   MRN: [id number]  HPI Here for wellness and f/u;  Overall doing ok;  Pt denies CP, worsening SOB, DOE, wheezing, orthopnea, PND, worsening LE edema, palpitations, dizziness or syncope.  Pt denies neurological change such as new headache, facial or extremity weakness.  Pt denies polydipsia, polyuria, or low sugar symptoms. Pt states overall good compliance with treatment and medications, good tolerability, and has been trying to follow lower cholesterol diet.  Pt denies worsening depressive symptoms, suicidal ideation or panic. No fever, night sweats, wt loss, loss of appetite, or other constitutional symptoms.  Pt states good ability with ADL's, has low fall risk, home safety reviewed and adequate, no other significant changes in hearing or vision, and only occasionally active with exercise.  Has been out of meds since late November due to lost job and insurance. Needs all med re-started excetp soma and xanax.  Past Medical History  Diagnosis Date  . DIABETES MELLITUS, TYPE II 07/05/2007  . HYPERLIPIDEMIA 07/05/2007  . ANXIETY 07/05/2007  . ERECTILE DYSFUNCTION 07/11/2007  . DEPRESSION 07/05/2007  . CARPAL TUNNEL SYNDROME, BILATERAL 07/05/2007  . Meralgia paresthetica 09/25/2008  . HYPERTENSION 07/05/2007  . SINUSITIS- ACUTE-NOS 03/26/2009  . GERD 07/11/2007  . PEPTIC ULCER DISEASE 07/05/2007  . SKIN LESION 03/18/2008  . Cervicalgia 03/18/2008  . LOW BACK PAIN 07/11/2007   Past Surgical History  Procedure Laterality Date  . Left wrist surgury/fracture  1985    reports that he has been smoking Cigars.  He has never used smokeless tobacco. He reports that  drinks alcohol. His drug history is not on file. family history includes Cancer in his maternal grandfather; Diabetes in his other; Heart disease in his maternal grandmother; and Hypertension in his other. Allergies  Allergen Reactions  . Codeine Itching    Current Outpatient Prescriptions on File Prior to Visit  Medication Sig Dispense Refill  . ALPRAZolam (XANAX) 1 MG tablet TAKE ONE-HALF TO ONE TABLET BY MOUTH TWICE DAILY AS NEEDED  60 tablet  4  . carisoprodol (SOMA) 350 MG tablet TAKE ONE TABLET BY MOUTH EVERY DAY AS NEEDED  30 tablet  4  . aspirin 81 MG tablet Take 81 mg by mouth daily.        Marland Kitchen glucose blood (ONE TOUCH TEST STRIPS) test strip Use as instructed  100 each  33  . Target Lancet Super Thin 30G MISC Use as directed 1 per day  100 each  11   No current facility-administered medications on file prior to visit.    Review of Systems Constitutional: Negative for diaphoresis, activity change, appetite change or unexpected weight change.  HENT: Negative for hearing loss, ear pain, facial swelling, mouth sores and neck stiffness.   Eyes: Negative for pain, redness and visual disturbance.  Respiratory: Negative for shortness of breath and wheezing.   Cardiovascular: Negative for chest pain and palpitations.  Gastrointestinal: Negative for diarrhea, blood in stool, abdominal distention or other pain Genitourinary: Negative for hematuria, flank pain or change in urine volume.  Musculoskeletal: Negative for myalgias and joint swelling.  Skin: Negative for color change and wound.  Neurological: Negative for syncope and numbness. other than noted Hematological: Negative for adenopathy.  Psychiatric/Behavioral: Negative for hallucinations, self-injury, decreased concentration and agitation.      Objective:   Physical Exam BP 152/80  Pulse 93  Temp(Src) 99.2 F (37.3 C) (Oral)  Ht 6' (1.829 m)  Wt 258 lb 2 oz (117.085 kg)  BMI 35 kg/m2  SpO2 93% VS noted,  Constitutional: Pt is oriented to person, place, and time. Appears well-developed and well-nourished.  Head: Normocephalic and atraumatic.  Right Ear: External ear normal.  Left Ear: External ear normal.  Nose: Nose normal.  Mouth/Throat: Oropharynx is clear and moist.   Eyes: Conjunctivae and EOM are normal. Pupils are equal, round, and reactive to light.  Neck: Normal range of motion. Neck supple. No JVD present. No tracheal deviation present.  Cardiovascular: Normal rate, regular rhythm, normal heart sounds and intact distal pulses.   Pulmonary/Chest: Effort normal and breath sounds normal.  Abdominal: Soft. Bowel sounds are normal. There is no tenderness. No HSM  Musculoskeletal: Normal range of motion. Exhibits no edema.  Lymphadenopathy:  Has no cervical adenopathy.  Neurological: Pt is alert and oriented to person, place, and time. Pt has normal reflexes. No cranial nerve deficit.  Skin: Skin is warm and dry. No rash noted.  Psychiatric:  Has  normal mood and affect. Behavior is normal.     Assessment & Plan:

## 2012-11-23 NOTE — Assessment & Plan Note (Signed)
To re-start meds, for a1c, and f/u 3 mo

## 2012-11-23 NOTE — Patient Instructions (Addendum)
Please re-start the Actos at 30 mg per day Otherwise, Please continue all other medications as before, and refills have been done if requested (hardcopy of the 90 days, and erx for the 30 days to [facility]) Please continue your efforts at being more active, low cholesterol diet, and weight control. You are otherwise up to date with prevention measures today. Thank you for enrolling in MyChart. Please follow the instructions below to securely access your online medical record. MyChart allows you to send messages to your doctor, view your test results, renew your prescriptions, schedule appointments, and more. To Log into My Chart online, please go by [employer] or [employer] to [email or web address], or download the MyChart App from the [employer] .  Your Username is: [email or web address] (pass [email or web address]) Please send a practice Message on Mychart later today. Please return in 3 months, or sooner if needed, with Lab testing done 3-5 days before

## 2013-01-01 ENCOUNTER — Telehealth: Payer: Self-pay

## 2013-01-01 MED ORDER — PIOGLITAZONE HCL 30 MG PO TABS: 30.0000 mg | ORAL_TABLET | Freq: Every day | ORAL | Status: DC

## 2013-01-01 MED ORDER — ATORVASTATIN CALCIUM 40 MG PO TABS: ORAL_TABLET | ORAL | Status: DC

## 2013-01-01 NOTE — Telephone Encounter (Signed)
refill 

## 2013-01-19 ENCOUNTER — Encounter: Payer: Self-pay | Admitting: Internal Medicine

## 2013-01-21 NOTE — Telephone Encounter (Signed)
See above:  I dont recall seeing these requests.  I will ask Zella Ball to look into this, as she helps with prior authorizations. thanks

## 2013-01-25 ENCOUNTER — Encounter: Payer: Self-pay | Admitting: Internal Medicine

## 2013-01-29 ENCOUNTER — Telehealth: Payer: Self-pay

## 2013-01-29 MED ORDER — ATORVASTATIN CALCIUM 20 MG PO TABS: 40.0000 mg | ORAL_TABLET | Freq: Every day | ORAL | Status: DC

## 2013-01-29 MED ORDER — METFORMIN HCL ER 500 MG PO TB24: 500.0000 mg | ORAL_TABLET | Freq: Three times a day (TID) | ORAL | Status: DC

## 2013-01-29 NOTE — Telephone Encounter (Signed)
for metormin Er 500   -  3 per day - done erx  Also for change to liptior 20 - 2 per day - done erx

## 2013-01-29 NOTE — Telephone Encounter (Signed)
There are 2 sets of directions on Metformin rx please advise [facility].  Also fax as well informing atorvastatin 40 mg exceeds plans maximum daily dose.  Advise on both please

## 2013-02-13 ENCOUNTER — Other Ambulatory Visit: Payer: Self-pay | Admitting: Internal Medicine

## 2013-02-13 NOTE — Telephone Encounter (Signed)
Done hardcopy to robin  

## 2013-02-13 NOTE — Telephone Encounter (Signed)
Faxed hardcopy to [facility] [place] 

## 2013-02-22 ENCOUNTER — Ambulatory Visit: Payer: No Typology Code available for payment source | Admitting: Internal Medicine

## 2013-05-24 ENCOUNTER — Encounter: Payer: Self-pay | Admitting: Internal Medicine

## 2013-05-24 ENCOUNTER — Ambulatory Visit (INDEPENDENT_AMBULATORY_CARE_PROVIDER_SITE_OTHER): Payer: No Typology Code available for payment source | Admitting: Internal Medicine

## 2013-05-24 ENCOUNTER — Other Ambulatory Visit: Payer: Self-pay | Admitting: Internal Medicine

## 2013-05-24 ENCOUNTER — Other Ambulatory Visit (INDEPENDENT_AMBULATORY_CARE_PROVIDER_SITE_OTHER): Payer: No Typology Code available for payment source

## 2013-05-24 VITALS — BP 114/78 | HR 87 | Temp 99.0°F | Ht 72.0 in | Wt 247.4 lb

## 2013-05-24 DIAGNOSIS — R109 Unspecified abdominal pain: Secondary | ICD-10-CM

## 2013-05-24 DIAGNOSIS — Z23 Encounter for immunization: Secondary | ICD-10-CM

## 2013-05-24 DIAGNOSIS — I10 Essential (primary) hypertension: Secondary | ICD-10-CM

## 2013-05-24 DIAGNOSIS — E119 Type 2 diabetes mellitus without complications: Secondary | ICD-10-CM

## 2013-05-24 DIAGNOSIS — R10A1 Flank pain, right side: Secondary | ICD-10-CM | POA: Insufficient documentation

## 2013-05-24 DIAGNOSIS — E785 Hyperlipidemia, unspecified: Secondary | ICD-10-CM

## 2013-05-24 DIAGNOSIS — R82998 Other abnormal findings in urine: Secondary | ICD-10-CM

## 2013-05-24 DIAGNOSIS — F411 Generalized anxiety disorder: Secondary | ICD-10-CM

## 2013-05-24 DIAGNOSIS — R3989 Other symptoms and signs involving the genitourinary system: Secondary | ICD-10-CM

## 2013-05-24 DIAGNOSIS — D751 Secondary polycythemia: Secondary | ICD-10-CM

## 2013-05-24 LAB — URINALYSIS, ROUTINE W REFLEX MICROSCOPIC
Bilirubin Urine: NEGATIVE
Hgb urine dipstick: NEGATIVE
Ketones, ur: NEGATIVE
Leukocytes, UA: NEGATIVE
Nitrite: NEGATIVE
Specific Gravity, Urine: 1.01 (ref 1.000–1.030)
Total Protein, Urine: NEGATIVE
pH: 5.5 (ref 5.0–8.0)

## 2013-05-24 LAB — HEPATIC FUNCTION PANEL
AST: 14 U/L (ref 0–37)
Albumin: 4.4 g/dL (ref 3.5–5.2)
Alkaline Phosphatase: 82 U/L (ref 39–117)
Total Bilirubin: 0.7 mg/dL (ref 0.3–1.2)

## 2013-05-24 LAB — CBC WITH DIFFERENTIAL/PLATELET
Basophils Absolute: 0 10*3/uL (ref 0.0–0.1)
Eosinophils Absolute: 0.2 10*3/uL (ref 0.0–0.7)
HCT: 54.1 % — ABNORMAL HIGH (ref 39.0–52.0)
Lymphs Abs: 1.1 10*3/uL (ref 0.7–4.0)
MCHC: 34.7 g/dL (ref 30.0–36.0)
MCV: 89.9 fl (ref 78.0–100.0)
Monocytes Absolute: 0.8 10*3/uL (ref 0.1–1.0)
Monocytes Relative: 8.4 % (ref 3.0–12.0)
Platelets: 241 10*3/uL (ref 150.0–400.0)
RDW: 13 % (ref 11.5–14.6)

## 2013-05-24 LAB — LIPID PANEL
HDL: 27.5 mg/dL — ABNORMAL LOW (ref >39.00)
Total CHOL/HDL Ratio: 9
VLDL: 62.4 mg/dL — ABNORMAL HIGH (ref 0.0–40.0)

## 2013-05-24 LAB — BASIC METABOLIC PANEL
BUN: 13 mg/dL (ref 6–23)
CO2: 26 mEq/L (ref 19–32)
Chloride: 104 mEq/L (ref 96–112)
Potassium: 5.3 mEq/L — ABNORMAL HIGH (ref 3.5–5.1)

## 2013-05-24 LAB — HEMOGLOBIN A1C: Hgb A1c MFr Bld: 11.9 % — ABNORMAL HIGH (ref 4.6–6.5)

## 2013-05-24 MED ORDER — PIOGLITAZONE HCL 30 MG PO TABS: 30.0000 mg | ORAL_TABLET | Freq: Every day | ORAL | Status: DC

## 2013-05-24 MED ORDER — GLIMEPIRIDE 4 MG PO TABS: 4.0000 mg | ORAL_TABLET | Freq: Every day | ORAL | Status: DC

## 2013-05-24 MED ORDER — METFORMIN HCL 500 MG PO TABS: ORAL_TABLET | ORAL | Status: DC

## 2013-05-24 MED ORDER — LISINOPRIL 5 MG PO TABS: 5.0000 mg | ORAL_TABLET | Freq: Every day | ORAL | Status: DC

## 2013-05-24 MED ORDER — CITALOPRAM HYDROBROMIDE 10 MG PO TABS: 10.0000 mg | ORAL_TABLET | Freq: Every day | ORAL | Status: DC

## 2013-05-24 MED ORDER — GLUCOSE BLOOD VI STRP: ORAL_STRIP | Status: DC

## 2013-05-24 MED ORDER — PRAVASTATIN SODIUM 40 MG PO TABS: 40.0000 mg | ORAL_TABLET | Freq: Every day | ORAL | Status: DC

## 2013-05-24 MED ORDER — TGT LANCET SUPER THIN 30G MISC: Status: DC

## 2013-05-24 NOTE — Assessment & Plan Note (Addendum)
?   Renal stone vs other with right flank pain/abnormal urine - for urine studies, CT renal stone protocol, tx for infection if needed, refer for any abnormality to urology on CT or hematuria on UA, no pain med given today  Note:  Total time for pt hx, exam, review of record with pt in the room, determination of diagnoses and plan for further eval and tx is > 40 min, with over 50% spent in coordination and counseling of patient

## 2013-05-24 NOTE — Assessment & Plan Note (Addendum)
For med re-start with instructions to check with [facility] as some rx there are free; to change metformin er 500 to the IR version, refills other meds, for a1c today and f/u next visit

## 2013-05-24 NOTE — Assessment & Plan Note (Signed)
Low BP today i suspect due to wt loss and relative volume depletion related to DM out of control; to d/c amlod and irbesartan, for lisinopril 5 qd

## 2013-05-24 NOTE — Progress Notes (Signed)
Subjective:    Patient ID: Jeffrey [name], male    DOB: 22-Jun-1966, 47 y.o.   MRN: [id number]  HPI  Here to f/u; overall doing ok,  Pt denies chest pain, increased sob or doe, wheezing, orthopnea, PND, increased LE swelling, palpitations, dizziness or syncope.  Pt denies polydipsia, polyuria, or low sugar symptoms such as weakness or confusion improved with po intake.  Pt denies new neurological symptoms such as new headache, or facial or extremity weakness or numbness.   Pt states has not taken any of his medications since April 2014 due to problems with getting medicaiton he can afford, with [facility] meds [facility] but turning out to be more expensive than than what he can get locally.  Has lost significant wt, though trying to follow diet.   Also incidentally with 4 wks onset right flank pain;  Has long hx of recurring LBP and this seems different, not necessarily worse to bend or stand, has had occ reddish/yellow urine, and particularly worse pain in the last week.  No fever, n/v, chills, abd pain, rash or swelling Past Medical History  Diagnosis Date  . DIABETES MELLITUS, TYPE II 07/05/2007  . HYPERLIPIDEMIA 07/05/2007  . ANXIETY 07/05/2007  . ERECTILE DYSFUNCTION 07/11/2007  . DEPRESSION 07/05/2007  . CARPAL TUNNEL SYNDROME, BILATERAL 07/05/2007  . Meralgia paresthetica 09/25/2008  . HYPERTENSION 07/05/2007  . SINUSITIS- ACUTE-NOS 03/26/2009  . GERD 07/11/2007  . PEPTIC ULCER DISEASE 07/05/2007  . SKIN LESION 03/18/2008  . Cervicalgia 03/18/2008  . LOW BACK PAIN 07/11/2007   Past Surgical History  Procedure Laterality Date  . Left wrist surgury/fracture  1985    reports that he has been smoking Cigars.  He has never used smokeless tobacco. He reports that he drinks alcohol. His drug history is not on file. family history includes Cancer in his maternal grandfather; Diabetes in his other; Heart disease in his maternal grandmother; Hypertension in his other. Allergies   Allergen Reactions  . Codeine Itching   Current Outpatient Prescriptions on File Prior to Visit  Medication Sig Dispense Refill  . ALPRAZolam (XANAX) 1 MG tablet TAKE ONE-HALF TO ONE TABLET BY MOUTH TWICE DAILY AS NEEDED  60 tablet  5  . aspirin 81 MG tablet Take 81 mg by mouth daily.        . carisoprodol (SOMA) 350 MG tablet TAKE ONE TABLET BY MOUTH EVERY DAY AS NEEDED  30 tablet  5  . fluticasone (FLONASE) 50 MCG/ACT nasal spray Place 2 sprays into the nose daily.  16 g  1  . tadalafil (CIALIS) 20 MG tablet Take 1 tablet (20 mg total) by mouth daily as needed.  10 tablet  11   No current facility-administered medications on file prior to visit.   Review of Systems  Constitutional: Negative for unexpected weight change, or unusual diaphoresis  HENT: Negative for tinnitus.   Eyes: Negative for photophobia and visual disturbance.  Respiratory: Negative for choking and stridor.   Gastrointestinal: Negative for vomiting and blood in stool.  Genitourinary: Negative for hematuria and decreased urine volume.  Musculoskeletal: Negative for acute joint swelling Skin: Negative for color change and wound.  Neurological: Negative for tremors and numbness other than noted  Psychiatric/Behavioral: Negative for decreased concentration or  hyperactivity.       Objective:   Physical Exam BP 114/78  Pulse 87  Temp(Src) 99 F (37.2 C) (Oral)  Ht 6' (1.829 m)  Wt 247 lb 6 oz (112.209 kg)  BMI 33.54 kg/m2  SpO2 97% VS noted,  Constitutional: Pt appears well-developed and well-nourished.  HENT: Head: NCAT.  Right Ear: External ear normal.  Left Ear: External ear normal.  Eyes: Conjunctivae and EOM are normal. Pupils are equal, round, and reactive to light.  Neck: Normal range of motion. Neck supple.  Cardiovascular: Normal rate and regular rhythm.   Pulmonary/Chest: Effort normal and breath sounds normal.  Abd:  Soft, NT, non-distended, + BS Does have a tender area to right flank near end  floating rib prob t11 or 12,  No rash or sweling Neurological: Pt is alert. Not confused  Skin: Skin is warm. No erythema.  Psychiatric: Pt behavior is normal. Thought content normal.     Assessment & Plan:

## 2013-05-24 NOTE — Patient Instructions (Addendum)
You had the flu shot today Your medications were adjusted today;  Please take all hardcopy to [facility] first to get the lowest prices as you can, then consider other local pharmacies or [facility] for the rest Please continue all other medications as before Please go to the LAB in the Basement (turn left off the elevator) for the tests to be done today You will be contacted by phone if any changes need to be made immediately.  Otherwise, you will receive a letter about your results with an explanation, but please check with MyChart first.  You will be contacted regarding the referral for: CT scan - to rule out renal stone - to see [facility] now  If there is infection with the testing, you will need an antibiotic  If the is"only" blood, you will still need urology referral  Please return in 3 months, or sooner if needed, with Lab testing done 3-5 days before

## 2013-05-24 NOTE — Assessment & Plan Note (Signed)
Also for urine studies, to urology if hematuria

## 2013-05-24 NOTE — Assessment & Plan Note (Signed)
Ok to change lexapro to citalopram 10, may need 20 mg

## 2013-05-24 NOTE — Assessment & Plan Note (Signed)
Cannot afford generic lipitro per pt, will change to pravastatin 40

## 2013-05-25 LAB — URINE CULTURE
Colony Count: NO GROWTH
Organism ID, Bacteria: NO GROWTH

## 2013-05-28 ENCOUNTER — Ambulatory Visit (INDEPENDENT_AMBULATORY_CARE_PROVIDER_SITE_OTHER)
Admission: RE | Admit: 2013-05-28 | Discharge: 2013-05-28 | Disposition: A | Discharge status: Home / Self Care | Payer: No Typology Code available for payment source | Source: Ambulatory Visit | Attending: Internal Medicine | Admitting: Internal Medicine

## 2013-05-28 ENCOUNTER — Encounter: Payer: Self-pay | Admitting: Internal Medicine

## 2013-05-28 DIAGNOSIS — R3989 Other symptoms and signs involving the genitourinary system: Secondary | ICD-10-CM

## 2013-05-28 DIAGNOSIS — R109 Unspecified abdominal pain: Secondary | ICD-10-CM

## 2013-05-28 DIAGNOSIS — R82998 Other abnormal findings in urine: Secondary | ICD-10-CM

## 2013-05-28 NOTE — Telephone Encounter (Signed)
Robin to see above 

## 2013-08-28 ENCOUNTER — Other Ambulatory Visit (INDEPENDENT_AMBULATORY_CARE_PROVIDER_SITE_OTHER): Payer: No Typology Code available for payment source

## 2013-08-28 DIAGNOSIS — E119 Type 2 diabetes mellitus without complications: Secondary | ICD-10-CM

## 2013-08-28 LAB — HEMOGLOBIN A1C: Hgb A1c MFr Bld: 7.6 % — ABNORMAL HIGH (ref 4.6–6.5)

## 2013-08-28 LAB — CBC WITH DIFFERENTIAL/PLATELET
BASOS ABS: 0.1 10*3/uL (ref 0.0–0.1)
BASOS PCT: 0.9 % (ref 0.0–3.0)
Eosinophils Absolute: 0.2 10*3/uL (ref 0.0–0.7)
Eosinophils Relative: 3.3 % (ref 0.0–5.0)
HCT: 52.5 % — ABNORMAL HIGH (ref 39.0–52.0)
Lymphocytes Relative: 17.6 % (ref 12.0–46.0)
Lymphs Abs: 1.2 10*3/uL (ref 0.7–4.0)
MCHC: 34.4 g/dL (ref 30.0–36.0)
MCV: 91.4 fl (ref 78.0–100.0)
MONOS PCT: 10.9 % (ref 3.0–12.0)
Monocytes Absolute: 0.8 10*3/uL (ref 0.1–1.0)
NEUTROS ABS: 4.6 10*3/uL (ref 1.4–7.7)
NEUTROS PCT: 67.3 % (ref 43.0–77.0)
Platelets: 211 10*3/uL (ref 150.0–400.0)
RBC: 5.74 Mil/uL (ref 4.22–5.81)
RDW: 13.4 % (ref 11.5–14.6)
WBC: 6.9 10*3/uL (ref 4.5–10.5)

## 2013-08-28 LAB — BASIC METABOLIC PANEL
BUN: 15 mg/dL (ref 6–23)
CO2: 28 mEq/L (ref 19–32)
Calcium: 9 mg/dL (ref 8.4–10.5)
Chloride: 106 mEq/L (ref 96–112)
Creatinine, Ser: 0.9 mg/dL (ref 0.4–1.5)
GFR: 100.23 mL/min (ref >60.00)
Glucose, Bld: 136 mg/dL — ABNORMAL HIGH (ref 70–99)
POTASSIUM: 4.8 meq/L (ref 3.5–5.1)
SODIUM: 139 meq/L (ref 135–145)

## 2013-08-28 LAB — HEPATIC FUNCTION PANEL
ALBUMIN: 4.1 g/dL (ref 3.5–5.2)
ALT: 16 U/L (ref 0–53)
AST: 12 U/L (ref 0–37)
Alkaline Phosphatase: 52 U/L (ref 39–117)
Bilirubin, Direct: 0.1 mg/dL (ref 0.0–0.3)
Total Bilirubin: 0.5 mg/dL (ref 0.3–1.2)
Total Protein: 6.8 g/dL (ref 6.0–8.3)

## 2013-08-28 LAB — LIPID PANEL
Cholesterol: 155 mg/dL (ref 0–200)
HDL: 34.7 mg/dL — ABNORMAL LOW (ref >39.00)
LDL Cholesterol: 100 mg/dL — ABNORMAL HIGH (ref 0–99)
Total CHOL/HDL Ratio: 4
Triglycerides: 102 mg/dL (ref 0.0–149.0)
VLDL: 20.4 mg/dL (ref 0.0–40.0)

## 2013-08-30 ENCOUNTER — Ambulatory Visit (INDEPENDENT_AMBULATORY_CARE_PROVIDER_SITE_OTHER): Payer: No Typology Code available for payment source | Admitting: Internal Medicine

## 2013-08-30 ENCOUNTER — Telehealth: Payer: Self-pay

## 2013-08-30 ENCOUNTER — Encounter: Payer: Self-pay | Admitting: Internal Medicine

## 2013-08-30 VITALS — BP 142/82 | HR 90 | Temp 99.0°F | Ht 72.0 in | Wt 264.0 lb

## 2013-08-30 DIAGNOSIS — J309 Allergic rhinitis, unspecified: Secondary | ICD-10-CM | POA: Insufficient documentation

## 2013-08-30 DIAGNOSIS — E119 Type 2 diabetes mellitus without complications: Secondary | ICD-10-CM

## 2013-08-30 DIAGNOSIS — Z0000 Encounter for general adult medical examination without abnormal findings: Secondary | ICD-10-CM

## 2013-08-30 DIAGNOSIS — I10 Essential (primary) hypertension: Secondary | ICD-10-CM

## 2013-08-30 DIAGNOSIS — F411 Generalized anxiety disorder: Secondary | ICD-10-CM

## 2013-08-30 DIAGNOSIS — D751 Secondary polycythemia: Secondary | ICD-10-CM

## 2013-08-30 MED ORDER — ALPRAZOLAM 1 MG PO TABS: ORAL_TABLET | ORAL | Status: DC

## 2013-08-30 MED ORDER — LISINOPRIL 10 MG PO TABS: 10.0000 mg | ORAL_TABLET | Freq: Every day | ORAL | Status: DC

## 2013-08-30 MED ORDER — TRIAMCINOLONE ACETONIDE 55 MCG/ACT NA AERO: 2.0000 | INHALATION_SPRAY | Freq: Every day | NASAL | Status: DC

## 2013-08-30 MED ORDER — GLIPIZIDE ER 10 MG PO TB24: 10.0000 mg | ORAL_TABLET | Freq: Every day | ORAL | Status: DC

## 2013-08-30 MED ORDER — FEXOFENADINE HCL 180 MG PO TABS
180.0000 mg | ORAL_TABLET | Freq: Every day | ORAL | Status: DC
Start: 2013-08-30 — End: 2015-06-23

## 2013-08-30 NOTE — Telephone Encounter (Signed)
[place] for PA, had burning and nosebleed with flonase

## 2013-08-30 NOTE — Assessment & Plan Note (Signed)
Not tolerating flonase, to change to nasacort aq, add allegra, consider allergy referral

## 2013-08-30 NOTE — Assessment & Plan Note (Signed)
Xanax refilled, declines ssri

## 2013-08-30 NOTE — Progress Notes (Signed)
Pre-visit discussion using our clinic review tool. No additional management support is needed unless otherwise documented below in the visit note.  

## 2013-08-30 NOTE — Assessment & Plan Note (Signed)
Much improved, goal a1c for change amaryl to glipizide xl 10 qd, cont all other meds

## 2013-08-30 NOTE — Telephone Encounter (Signed)
Received PA for Triamcinolone 71mcg/inh,  Advise please

## 2013-08-30 NOTE — Assessment & Plan Note (Signed)

## 2013-08-30 NOTE — Assessment & Plan Note (Addendum)
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

## 2013-08-30 NOTE — Progress Notes (Signed)
Subjective:    Patient ID: Jeffrey [name], male    DOB: 08/06/1966, 48 y.o.   MRN: [id number]  HPI  Here for wellness and f/u;  Overall doing ok;  Pt denies CP, worsening SOB, DOE, wheezing, orthopnea, PND, worsening LE edema, palpitations, dizziness or syncope.  Pt denies neurological change such as new headache, facial or extremity weakness.  Pt denies polydipsia, polyuria, or low sugar symptoms. Pt states overall good compliance with treatment and medications, good tolerability, and has been trying to follow lower cholesterol diet.  Pt denies worsening depressive symptoms, suicidal ideation or panic. No fever, night sweats,, loss of appetite, or other constitutional symptoms.  Pt states good ability with ADL's, has low fall risk, home safety reviewed and adequate, no other significant changes in hearing or vision, and only occasionally active with exercise. Has had wt loss with better diet/less portions x 2 yrs - peak over 210, now 264.  Still snores at night, no daytime somnolence, has marked sinus congestoin every night, does not use flonase daily due to burning.  Has occas LBP and left knee aching as well. Past Medical History  Diagnosis Date  . DIABETES MELLITUS, TYPE II 07/05/2007  . HYPERLIPIDEMIA 07/05/2007  . ANXIETY 07/05/2007  . ERECTILE DYSFUNCTION 07/11/2007  . DEPRESSION 07/05/2007  . CARPAL TUNNEL SYNDROME, BILATERAL 07/05/2007  . Meralgia paresthetica 09/25/2008  . HYPERTENSION 07/05/2007  . SINUSITIS- ACUTE-NOS 03/26/2009  . GERD 07/11/2007  . PEPTIC ULCER DISEASE 07/05/2007  . SKIN LESION 03/18/2008  . Cervicalgia 03/18/2008  . LOW BACK PAIN 07/11/2007   Past Surgical History  Procedure Laterality Date  . Left wrist surgury/fracture  1985    reports that he has been smoking Cigars.  He has never used smokeless tobacco. He reports that he drinks alcohol. His drug history is not on file. family history includes Cancer in his maternal grandfather; Diabetes in his other;  Heart disease in his maternal grandmother; Hypertension in his other. Allergies  Allergen Reactions  . Codeine Itching   Current Outpatient Prescriptions on File Prior to Visit  Medication Sig Dispense Refill  . aspirin 81 MG tablet Take 81 mg by mouth daily.        . carisoprodol (SOMA) 350 MG tablet TAKE ONE TABLET BY MOUTH EVERY DAY AS NEEDED  30 tablet  5  . citalopram (CELEXA) 10 MG tablet Take 1 tablet (10 mg total) by mouth daily.  90 tablet  3  . glucose blood (ONE TOUCH TEST STRIPS) test strip Use as instructed  100 each  33  . metFORMIN (GLUCOPHAGE) 500 MG tablet 2 tabs by mouth in the am., and 1 tab in the PM  270 tablet  3  . pioglitazone (ACTOS) 30 MG tablet Take 1 tablet (30 mg total) by mouth daily.  90 tablet  3  . pravastatin (PRAVACHOL) 40 MG tablet Take 1 tablet (40 mg total) by mouth daily.  90 tablet  3  . tadalafil (CIALIS) 20 MG tablet Take 1 tablet (20 mg total) by mouth daily as needed.  10 tablet  11  . Target Lancet Super Thin 30G MISC Use as directed 1 per day  100 each  11   No current facility-administered medications on file prior to visit.   Review of Systems Constitutional: Negative for diaphoresis, activity change, appetite change or unexpected weight change.  HENT: Negative for hearing loss, ear pain, facial swelling, mouth sores and neck stiffness.   Eyes: Negative for pain, redness and visual  disturbance.  Respiratory: Negative for shortness of breath and wheezing.   Cardiovascular: Negative for chest pain and palpitations.  Gastrointestinal: Negative for diarrhea, blood in stool, abdominal distention or other pain Genitourinary: Negative for hematuria, flank pain or change in urine volume.  Musculoskeletal: Negative for myalgias and joint swelling.  Skin: Negative for color change and wound.  Neurological: Negative for syncope and numbness. other than noted Hematological: Negative for adenopathy.  Psychiatric/Behavioral: Negative for  hallucinations, self-injury, decreased concentration and agitation.      Objective:   Physical Exam BP 142/82  Pulse 90  Temp(Src) 99 F (37.2 C) (Oral)  Ht 6' (1.829 m)  Wt 264 lb (119.75 kg)  BMI 35.80 kg/m2  SpO2 93% VS noted,  Constitutional: Pt is oriented to person, place, and time. Appears well-developed and well-nourished.  Head: Normocephalic and atraumatic.  Right Ear: External ear normal.  Left Ear: External ear normal.  Nose: Nose normal.  Mouth/Throat: Oropharynx is clear and moist.  Eyes: Conjunctivae and EOM are normal. Pupils are equal, round, and reactive to light.  Neck: Normal range of motion. Neck supple. No JVD present. No tracheal deviation present.  Cardiovascular: Normal rate, regular rhythm, normal heart sounds and intact distal pulses.   Pulmonary/Chest: Effort normal and breath sounds normal.  Abdominal: Soft. Bowel sounds are normal. There is no tenderness. No HSM  Musculoskeletal: Normal range of motion. Exhibits no edema.  Lymphadenopathy:  Has no cervical adenopathy.  Neurological: Pt is alert and oriented to person, place, and time. Pt has normal reflexes. No cranial nerve deficit.  Skin: Skin is warm and dry. No rash noted.  Psychiatric:  Has  normal mood and affect. Behavior is normal.  Left knee FROM, no crepitus, no effusion Spine nontender throughout    Assessment & Plan:

## 2013-08-30 NOTE — Patient Instructions (Addendum)
OK to stop the glimeparide and the flonase Please take all new medication as prescribed - the glipizide 10 mg ER - 1 per day, the allegra daily, and the nasacort Please increase the lisinopril to 10 mg per day Please continue all other medications as before, and refills have been done if requested. Please have the pharmacy call with any other refills you may need. Please continue your efforts at being more active, low cholesterol diet, and weight control. You are otherwise up to date with prevention measures today.  Please keep your appointments with your specialists as you may have planned  You will be contacted regarding the referral for: [facility] home [facility] for an Overnight Oximetry test  Please stop smoking  Please return in 6 months, or sooner if needed, with Lab testing done 3-5 days before

## 2013-08-30 NOTE — Assessment & Plan Note (Signed)
Mild uncontrolled, to incr the lisinopril to 10 qd

## 2013-08-31 ENCOUNTER — Telehealth: Payer: Self-pay | Admitting: Internal Medicine

## 2013-08-31 NOTE — Telephone Encounter (Signed)
Relevant patient education assigned to patient using Emmi. ° °

## 2013-09-01 ENCOUNTER — Encounter: Payer: Self-pay | Admitting: Internal Medicine

## 2013-09-02 ENCOUNTER — Telehealth: Payer: Self-pay

## 2013-09-02 NOTE — Telephone Encounter (Signed)
Relevant patient education assigned to patient using Emmi. ° °

## 2013-09-04 NOTE — Telephone Encounter (Signed)
Will do PA as advised by PCP.

## 2013-09-25 ENCOUNTER — Telehealth: Payer: Self-pay | Admitting: Internal Medicine

## 2013-09-25 NOTE — Telephone Encounter (Signed)
OK for robin to let pt know:  We received the information/results from his overnight oximetry done feb 4  There is no significant low oxygen at night to suggest a problem with this, and not likely then to have sleep apnea  No further evaluation needed at this time

## 2013-09-25 NOTE — Telephone Encounter (Signed)
Called the patient informed of results 

## 2013-10-09 ENCOUNTER — Other Ambulatory Visit: Payer: Self-pay | Admitting: Internal Medicine

## 2013-10-09 ENCOUNTER — Encounter: Payer: Self-pay | Admitting: Internal Medicine

## 2013-10-09 NOTE — Telephone Encounter (Signed)
Done hardcopy to robin  

## 2013-10-09 NOTE — Telephone Encounter (Signed)
Faxed hardcopy to [facility] 

## 2013-10-15 ENCOUNTER — Encounter: Payer: Self-pay | Admitting: Internal Medicine

## 2013-10-25 ENCOUNTER — Telehealth: Payer: Self-pay

## 2013-10-25 NOTE — Telephone Encounter (Signed)
PA for Triaminolone 55 mcg nasal spray has been approved from [place] from 10/15/13 though 10/15/16.   Letter of approval faxed and also faxed to [place].  Called the patient left detailed message of approval.  PA  Approval from [employer]. At [facility].

## 2013-10-31 ENCOUNTER — Telehealth: Payer: Self-pay | Admitting: *Deleted

## 2013-10-31 ENCOUNTER — Ambulatory Visit (INDEPENDENT_AMBULATORY_CARE_PROVIDER_SITE_OTHER): Payer: No Typology Code available for payment source | Admitting: Emergency Medicine

## 2013-10-31 VITALS — BP 142/80 | HR 92 | Temp 98.3°F | Resp 18 | Ht 71.0 in | Wt 272.0 lb

## 2013-10-31 DIAGNOSIS — M436 Torticollis: Secondary | ICD-10-CM

## 2013-10-31 MED ORDER — CYCLOBENZAPRINE HCL 10 MG PO TABS: 10.0000 mg | ORAL_TABLET | Freq: Three times a day (TID) | ORAL | Status: DC | PRN

## 2013-10-31 MED ORDER — HYDROCODONE-ACETAMINOPHEN 5-325 MG PO TABS: 1.0000 | ORAL_TABLET | ORAL | Status: DC | PRN

## 2013-10-31 MED ORDER — NAPROXEN SODIUM 550 MG PO TABS: 550.0000 mg | ORAL_TABLET | Freq: Two times a day (BID) | ORAL | Status: AC

## 2013-10-31 NOTE — Telephone Encounter (Signed)
[facility] Triage Call Report Triage Record Num: [id number] Operator: Maryln Manuel Patient Name: Jeffrey [name] Call Date & Time: 10/31/2013 8:11:53AM Patient Phone: [phone] PCP: Cathlean Cower Patient Gender: Male PCP Fax : [phone] Patient DOB: [birth year] Practice Name: [facility] Reason for Call: Caller: [name]/Patient; PCP: Cathlean Cower (Adults only); CB#: [phone]; Call regarding Neck and shoulder pain; Onset 10/30/13. Denies any injury. Reports top of left shoulder has severe pain. Afebrile. Reports severe pain and inability to move head like normal. Patient says he has had issues with this before, but it is unbearable at this time. Triaged per Neck Pain guideline, disposition: see ED immediately for "Unbearable pain." Advised patient to go to [facility] at this time for evaluation. Patient did not want to do that, so triager advised to go to [facility] instead. Patient again did not want to do this. Sent message to office to see if MD had any further instructions. Protocol(s) Used: Neck Pain Recommended Outcome per Protocol: See ED Immediately Reason for Outcome: Unbearable pain Care Advice: ~

## 2013-10-31 NOTE — Patient Instructions (Signed)
Torticollis, Acute °You have suddenly (acutely) developed a twisted neck (torticollis). This is usually a self-limited condition. °CAUSES  °Acute torticollis may be caused by malposition, trauma or infection. Most commonly, acute torticollis is caused by sleeping in an awkward position. Torticollis may also be caused by the flexion, extension or twisting of the neck muscles beyond their normal position. Sometimes, the exact cause may not be known. °SYMPTOMS  °Usually, there is pain and limited movement of the neck. Your neck may twist to one side. °DIAGNOSIS  °The diagnosis is often made by physical examination. X-rays, CT scans or MRIs may be done if there is a history of trauma or concern of infection. °TREATMENT  °For a common, stiff neck that develops during sleep, treatment is focused on relaxing the contracted neck muscle. Medications (including shots) may be used to treat the problem. Most cases resolve in several days. Torticollis usually responds to conservative physical therapy. If left untreated, the shortened and spastic neck muscle can cause deformities in the face and neck. Rarely, surgery is required. °HOME CARE INSTRUCTIONS  °· Use over-the-counter and prescription medications as directed by your caregiver. °· Do stretching exercises and massage the neck as directed by your caregiver. °· Follow up with physical therapy if needed and as directed by your caregiver. °SEEK IMMEDIATE MEDICAL CARE IF:  °· You develop difficulty breathing or noisy breathing (stridor). °· You drool, develop trouble swallowing or have pain with swallowing. °· You develop numbness or weakness in the hands or feet. °· You have changes in speech or vision. °· You have problems with urination or bowel movements. °· You have difficulty walking. °· You have a fever. °· You have increased pain. °MAKE SURE YOU:  °· Understand these instructions. °· Will watch your condition. °· Will get help right away if you are not doing well or  get worse. °Document Released: 07/29/2000 Document Revised: 10/24/2011 Document Reviewed: 09/09/2009 °[employer]® Patient Information ©2014 [employer]. ° °

## 2013-10-31 NOTE — Progress Notes (Signed)
[facility] [address] [place] [address] [phone]  Date:  10/31/2013   Name:  Jeffrey [name]   DOB:  1966/01/25   MRN:  [id number]  PCP:  Cathlean Cower, MD    Chief Complaint: Neck Pain and Shoulder Pain   History of Present Illness:  Jeffrey [name] is a 48 y.o. very pleasant male patient who presents with the following:  No history of injury.  Has infrequent neck pains.  Now has a two day history of left neck pain and immobility.  No radiation of pain, no neuro symptoms.  No weakness.  Chronic recurrent low back pain.  No improvement with over the counter medications or other home remedies. Denies other complaint or health concern today.   Patient Active Problem List   Diagnosis Date Noted  . Polycythemia, secondary 08/30/2013  . Allergic rhinitis, cause unspecified 08/30/2013  . Abnormal urine color 05/24/2013  . Right flank pain 05/24/2013  . Skin cancer, basal cell 12/27/2010  . Right shoulder pain 12/27/2010  . Right hand pain 12/27/2010  . Preventative health care 12/26/2010  . Meralgia paresthetica 09/25/2008  . Cervicalgia 03/18/2008  . ERECTILE DYSFUNCTION 07/11/2007  . GERD 07/11/2007  . LOW BACK PAIN 07/11/2007  . DIABETES MELLITUS, TYPE II 07/05/2007  . HYPERLIPIDEMIA 07/05/2007  . ANXIETY 07/05/2007  . DEPRESSION 07/05/2007  . CARPAL TUNNEL SYNDROME, BILATERAL 07/05/2007  . HYPERTENSION 07/05/2007  . PEPTIC ULCER DISEASE 07/05/2007    Past Medical History  Diagnosis Date  . DIABETES MELLITUS, TYPE II 07/05/2007  . HYPERLIPIDEMIA 07/05/2007  . ANXIETY 07/05/2007  . ERECTILE DYSFUNCTION 07/11/2007  . DEPRESSION 07/05/2007  . CARPAL TUNNEL SYNDROME, BILATERAL 07/05/2007  . Meralgia paresthetica 09/25/2008  . HYPERTENSION 07/05/2007  . SINUSITIS- ACUTE-NOS 03/26/2009  . GERD 07/11/2007  . PEPTIC ULCER DISEASE 07/05/2007  . SKIN LESION 03/18/2008  . Cervicalgia 03/18/2008  . LOW BACK PAIN 07/11/2007  . Allergy   . Cancer      Past Surgical History  Procedure Laterality Date  . Left wrist surgury/fracture  1985  . Bassel cell      History  Substance Use Topics  . Smoking status: Current Every Day Smoker    Types: Cigars  . Smokeless tobacco: Never Used     Comment: occasional cigar  . Alcohol Use: Yes     Comment: social    Family History  Problem Relation Age of Onset  . Heart disease Maternal Grandmother   . Cancer Maternal Grandfather     colon cancer  . Diabetes Other   . Hypertension Other   . Diabetes Mother   . Hypertension Mother   . Diabetes Father   . Hypertension Father     Allergies  Allergen Reactions  . Codeine Itching    Medication list has been reviewed and updated.  Current Outpatient Prescriptions on File Prior to Visit  Medication Sig Dispense Refill  . ALPRAZolam (XANAX) 1 MG tablet TAKE ONE-HALF TO ONE TABLET BY MOUTH three times daily as needed  90 tablet  5  . aspirin 81 MG tablet Take 81 mg by mouth daily.        . carisoprodol (SOMA) 350 MG tablet TAKE 1 TABLET BY MOUTH DAILY AS NEEDED  30 tablet  3  . citalopram (CELEXA) 10 MG tablet Take 1 tablet (10 mg total) by mouth daily.  90 tablet  3  . glipiZIDE (GLIPIZIDE XL) 10 MG 24 hr tablet Take 1 tablet (10  mg total) by mouth daily with breakfast.  90 tablet  3  . glucose blood (ONE TOUCH TEST STRIPS) test strip Use as instructed  100 each  33  . lisinopril (PRINIVIL,ZESTRIL) 10 MG tablet Take 1 tablet (10 mg total) by mouth daily.  90 tablet  3  . metFORMIN (GLUCOPHAGE) 500 MG tablet 2 tabs by mouth in the am., and 1 tab in the PM  270 tablet  3  . pioglitazone (ACTOS) 30 MG tablet Take 1 tablet (30 mg total) by mouth daily.  90 tablet  3  . pravastatin (PRAVACHOL) 40 MG tablet Take 1 tablet (40 mg total) by mouth daily.  90 tablet  3  . tadalafil (CIALIS) 20 MG tablet Take 1 tablet (20 mg total) by mouth daily as needed.  10 tablet  11  . Target Lancet Super Thin 30G MISC Use as directed 1 per day  100 each   11  . fexofenadine (ALLEGRA) 180 MG tablet Take 1 tablet (180 mg total) by mouth daily.  90 tablet  3  . triamcinolone (NASACORT AQ) 55 MCG/ACT AERO nasal inhaler Place 2 sprays into the nose daily.  1 Inhaler  12   No current facility-administered medications on file prior to visit.    Review of Systems:  As per HPI, otherwise negative.    Physical Examination: Filed Vitals:   10/31/13 1332  BP: 142/80  Pulse: 92  Temp: 98.3 F (36.8 C)  Resp: 18   Filed Vitals:   10/31/13 1332  Height: 5\' 11"  (1.803 m)  Weight: 272 lb (123.378 kg)   Body mass index is 37.95 kg/(m^2). Ideal Body Weight: Weight in (lb) to have BMI = 25: 178.9   GEN: WDWN, NAD, Non-toxic, Alert & Oriented x 3 HEENT: Atraumatic, Normocephalic.  Ears and Nose: No external deformity. EXTR: No clubbing/cyanosis/edema NEURO: Normal gait. Neuro intact motor and sensory uppers PSYCH: Normally interactive. Conversant. Not depressed or anxious appearing.  Calm demeanor.  Neck tender left trapezius.  Spasm on the left   Assessment and Plan: Cervical strain torticolis Flexeril Anaprox vicodin   Signed,  Ellison Carwin, MD

## 2013-11-09 ENCOUNTER — Other Ambulatory Visit: Payer: Self-pay | Admitting: Internal Medicine

## 2013-11-11 NOTE — Telephone Encounter (Signed)
Refill done.  

## 2014-02-28 ENCOUNTER — Ambulatory Visit: Payer: No Typology Code available for payment source | Admitting: Internal Medicine

## 2014-03-01 ENCOUNTER — Other Ambulatory Visit: Payer: Self-pay | Admitting: Internal Medicine

## 2014-03-04 MED ORDER — CARISOPRODOL 350 MG PO TABS: ORAL_TABLET | ORAL | Status: DC

## 2014-03-04 MED ORDER — ALPRAZOLAM 1 MG PO TABS: ORAL_TABLET | ORAL | Status: DC

## 2014-03-04 NOTE — Addendum Note (Signed)
Addended by: Sharon Seller B on: 03/04/2014 08:32 AM   Modules accepted: Orders

## 2014-03-04 NOTE — Addendum Note (Signed)
Addended by: Biagio Borg on: 03/04/2014 01:04 PM   Modules accepted: Orders

## 2014-03-04 NOTE — Telephone Encounter (Signed)
Done hardcopy to robin  

## 2014-03-04 NOTE — Telephone Encounter (Signed)
Faxed hardcopy to [place]

## 2014-04-07 ENCOUNTER — Other Ambulatory Visit: Payer: Self-pay | Admitting: Internal Medicine

## 2014-05-24 ENCOUNTER — Other Ambulatory Visit: Payer: Self-pay | Admitting: Internal Medicine

## 2014-06-23 ENCOUNTER — Ambulatory Visit (INDEPENDENT_AMBULATORY_CARE_PROVIDER_SITE_OTHER): Payer: Self-pay | Admitting: Emergency Medicine

## 2014-06-23 VITALS — BP 134/86 | HR 86 | Temp 98.5°F | Resp 16 | Ht 71.0 in | Wt 285.2 lb

## 2014-06-23 DIAGNOSIS — Z021 Encounter for pre-employment examination: Secondary | ICD-10-CM

## 2014-06-23 DIAGNOSIS — E119 Type 2 diabetes mellitus without complications: Secondary | ICD-10-CM

## 2014-06-23 LAB — POCT GLYCOSYLATED HEMOGLOBIN (HGB A1C): Hemoglobin A1C: 7.1

## 2014-06-23 NOTE — Progress Notes (Signed)
[facility] [address] [place] [address] [phone]  Date:  06/23/2014   Name:  Jeffrey [name]   DOB:  1965/11/11   MRN:  [id number]  PCP:  Cathlean Cower, MD    Chief Complaint: Annual Exam   History of Present Illness:  Jeffrey [name] is a 48 y.o. very pleasant male patient who presents with the following:  DOT NIDDM HBP   Patient Active Problem List   Diagnosis Date Noted  . Polycythemia, secondary 08/30/2013  . Allergic rhinitis, cause unspecified 08/30/2013  . Abnormal urine color 05/24/2013  . Right flank pain 05/24/2013  . Skin cancer, basal cell 12/27/2010  . Right shoulder pain 12/27/2010  . Right hand pain 12/27/2010  . Preventative health care 12/26/2010  . Meralgia paresthetica 09/25/2008  . Cervicalgia 03/18/2008  . ERECTILE DYSFUNCTION 07/11/2007  . GERD 07/11/2007  . LOW BACK PAIN 07/11/2007  . DIABETES MELLITUS, TYPE II 07/05/2007  . HYPERLIPIDEMIA 07/05/2007  . ANXIETY 07/05/2007  . DEPRESSION 07/05/2007  . CARPAL TUNNEL SYNDROME, BILATERAL 07/05/2007  . HYPERTENSION 07/05/2007  . PEPTIC ULCER DISEASE 07/05/2007    Past Medical History  Diagnosis Date  . DIABETES MELLITUS, TYPE II 07/05/2007  . HYPERLIPIDEMIA 07/05/2007  . ANXIETY 07/05/2007  . ERECTILE DYSFUNCTION 07/11/2007  . DEPRESSION 07/05/2007  . CARPAL TUNNEL SYNDROME, BILATERAL 07/05/2007  . Meralgia paresthetica 09/25/2008  . HYPERTENSION 07/05/2007  . SINUSITIS- ACUTE-NOS 03/26/2009  . GERD 07/11/2007  . PEPTIC ULCER DISEASE 07/05/2007  . SKIN LESION 03/18/2008  . Cervicalgia 03/18/2008  . LOW BACK PAIN 07/11/2007  . Allergy   . Cancer     Past Surgical History  Procedure Laterality Date  . Left wrist surgury/fracture  1985  . Bassel cell      History  Substance Use Topics  . Smoking status: Former Smoker    Types: Cigars    Quit date: 02/17/2014  . Smokeless tobacco: Never Used     Comment: occasional cigar  . Alcohol Use: 0.0 oz/week     0 Not specified per week     Comment: social    Family History  Problem Relation Age of Onset  . Heart disease Maternal Grandmother   . Cancer Maternal Grandfather     colon cancer  . Diabetes Other   . Hypertension Other   . Diabetes Mother   . Hypertension Mother   . Diabetes Father   . Hypertension Father     Allergies  Allergen Reactions  . Codeine Itching    Medication list has been reviewed and updated.  Current Outpatient Prescriptions on File Prior to Visit  Medication Sig Dispense Refill  . ALPRAZolam (XANAX) 1 MG tablet TAKE ONE-HALF TO ONE TABLET BY MOUTH three times daily as needed 90 tablet 5  . aspirin 81 MG tablet Take 81 mg by mouth daily.      . carisoprodol (SOMA) 350 MG tablet TAKE 1 TABLET BY MOUTH DAILY AS NEEDED 30 tablet 3  . glipiZIDE (GLIPIZIDE XL) 10 MG 24 hr tablet Take 1 tablet (10 mg total) by mouth daily with breakfast. 90 tablet 3  . glucose blood (ONE TOUCH TEST STRIPS) test strip Use as instructed 100 each 33  . lisinopril (PRINIVIL,ZESTRIL) 10 MG tablet Take 1 tablet (10 mg total) by mouth daily. 90 tablet 3  . metFORMIN (GLUCOPHAGE) 500 MG tablet TAKE TWO TABLETS BY MOUTH EVERY MORNING AND TAKE 1 TABLET BY MOUTH IN THE EVENING 270 tablet 0  .  pioglitazone (ACTOS) 30 MG tablet TAKE 1 TABLET BY MOUTH DAILY 31 tablet 11  . pravastatin (PRAVACHOL) 40 MG tablet Take 1 tablet (40 mg total) by mouth daily. 90 tablet 3  . Target Lancet Super Thin 30G MISC Use as directed 1 per day 100 each 11  . triamcinolone (NASACORT AQ) 55 MCG/ACT AERO nasal inhaler Place 2 sprays into the nose daily. 1 Inhaler 12  . citalopram (CELEXA) 10 MG tablet Take 1 tablet (10 mg total) by mouth daily. 90 tablet 3  . cyclobenzaprine (FLEXERIL) 10 MG tablet Take 1 tablet (10 mg total) by mouth 3 (three) times daily as needed for muscle spasms. 30 tablet 0  . fexofenadine (ALLEGRA) 180 MG tablet Take 1 tablet (180 mg total) by mouth daily. 90 tablet 3  .  HYDROcodone-acetaminophen (NORCO) 5-325 MG per tablet Take 1-2 tablets by mouth every 4 (four) hours as needed for moderate pain. 30 tablet 0  . naproxen sodium (ANAPROX DS) 550 MG tablet Take 1 tablet (550 mg total) by mouth 2 (two) times daily with a meal. 40 tablet 0  . tadalafil (CIALIS) 20 MG tablet Take 1 tablet (20 mg total) by mouth daily as needed. 10 tablet 11   No current facility-administered medications on file prior to visit.    Review of Systems:  As per HPI, otherwise negative.    Physical Examination: Filed Vitals:   06/23/14 0844  BP: 134/86  Pulse: 86  Temp: 98.5 F (36.9 C)  Resp: 16   Filed Vitals:   06/23/14 0844  Height: 5\' 11"  (1.803 m)  Weight: 285 lb 3.2 oz (129.366 kg)   Body mass index is 39.8 kg/(m^2). Ideal Body Weight: Weight in (lb) to have BMI = 25: 178.9  GEN: WDWN, NAD, Non-toxic, A & O x 3 HEENT: Atraumatic, Normocephalic. Neck supple. No masses, No LAD. Ears and Nose: No external deformity. CV: RRR, No M/G/R. No JVD. No thrill. No extra heart sounds. PULM: CTA B, no wheezes, crackles, rhonchi. No retractions. No resp. distress. No accessory muscle use. ABD: S, NT, ND, +BS. No rebound. No HSM. EXTR: No c/c/e NEURO Normal gait.  PSYCH: Normally interactive. Conversant. Not depressed or anxious appearing.  Calm demeanor.    Assessment and Plan: DOT   Signed,  Ellison Carwin, MD   Results for orders placed or performed in visit on 06/23/14  POCT glycosylated hemoglobin (Hb A1C)  Result Value Ref Range   Hemoglobin A1C 7.1

## 2014-07-22 ENCOUNTER — Other Ambulatory Visit: Payer: Self-pay | Admitting: Internal Medicine

## 2014-07-23 NOTE — Telephone Encounter (Signed)
Done hardcopy to [facility] 

## 2014-07-23 NOTE — Telephone Encounter (Signed)
Faxed to [employer]...Johny Chess

## 2014-11-06 ENCOUNTER — Other Ambulatory Visit: Payer: Self-pay | Admitting: Internal Medicine

## 2014-11-06 NOTE — Telephone Encounter (Signed)
Done hardcopy to Cherina  

## 2014-11-06 NOTE — Telephone Encounter (Signed)
rx done

## 2014-11-07 ENCOUNTER — Other Ambulatory Visit: Payer: Self-pay | Admitting: Internal Medicine

## 2014-11-11 ENCOUNTER — Other Ambulatory Visit: Payer: Self-pay

## 2014-11-11 MED ORDER — METFORMIN HCL 500 MG PO TABS: 500.0000 mg | ORAL_TABLET | Freq: Two times a day (BID) | ORAL | Status: DC

## 2014-12-06 ENCOUNTER — Other Ambulatory Visit: Payer: Self-pay | Admitting: Internal Medicine

## 2015-01-08 ENCOUNTER — Other Ambulatory Visit: Payer: Self-pay | Admitting: Internal Medicine

## 2015-02-08 ENCOUNTER — Emergency Department (HOSPITAL_COMMUNITY)
Admission: EM | Admit: 2015-02-08 | Discharge: 2015-02-08 | Disposition: A | Discharge status: Home / Self Care | Payer: No Typology Code available for payment source | Attending: Emergency Medicine | Admitting: Emergency Medicine

## 2015-02-08 ENCOUNTER — Encounter (HOSPITAL_COMMUNITY): Payer: Self-pay | Admitting: Emergency Medicine

## 2015-02-08 DIAGNOSIS — Z79899 Other long term (current) drug therapy: Secondary | ICD-10-CM | POA: Insufficient documentation

## 2015-02-08 DIAGNOSIS — I10 Essential (primary) hypertension: Secondary | ICD-10-CM | POA: Insufficient documentation

## 2015-02-08 DIAGNOSIS — Z8719 Personal history of other diseases of the digestive system: Secondary | ICD-10-CM | POA: Insufficient documentation

## 2015-02-08 DIAGNOSIS — Z859 Personal history of malignant neoplasm, unspecified: Secondary | ICD-10-CM | POA: Insufficient documentation

## 2015-02-08 DIAGNOSIS — Z87891 Personal history of nicotine dependence: Secondary | ICD-10-CM | POA: Insufficient documentation

## 2015-02-08 DIAGNOSIS — M545 Low back pain, unspecified: Secondary | ICD-10-CM

## 2015-02-08 DIAGNOSIS — E119 Type 2 diabetes mellitus without complications: Secondary | ICD-10-CM | POA: Insufficient documentation

## 2015-02-08 DIAGNOSIS — Z872 Personal history of diseases of the skin and subcutaneous tissue: Secondary | ICD-10-CM | POA: Insufficient documentation

## 2015-02-08 DIAGNOSIS — Y929 Unspecified place or not applicable: Secondary | ICD-10-CM | POA: Insufficient documentation

## 2015-02-08 DIAGNOSIS — Z7982 Long term (current) use of aspirin: Secondary | ICD-10-CM | POA: Insufficient documentation

## 2015-02-08 DIAGNOSIS — F329 Major depressive disorder, single episode, unspecified: Secondary | ICD-10-CM | POA: Insufficient documentation

## 2015-02-08 DIAGNOSIS — Z8711 Personal history of peptic ulcer disease: Secondary | ICD-10-CM | POA: Insufficient documentation

## 2015-02-08 DIAGNOSIS — Z87438 Personal history of other diseases of male genital organs: Secondary | ICD-10-CM | POA: Insufficient documentation

## 2015-02-08 DIAGNOSIS — Z8709 Personal history of other diseases of the respiratory system: Secondary | ICD-10-CM | POA: Insufficient documentation

## 2015-02-08 DIAGNOSIS — Y999 Unspecified external cause status: Secondary | ICD-10-CM | POA: Insufficient documentation

## 2015-02-08 DIAGNOSIS — S3992XA Unspecified injury of lower back, initial encounter: Secondary | ICD-10-CM | POA: Insufficient documentation

## 2015-02-08 DIAGNOSIS — F419 Anxiety disorder, unspecified: Secondary | ICD-10-CM | POA: Insufficient documentation

## 2015-02-08 DIAGNOSIS — Y9389 Activity, other specified: Secondary | ICD-10-CM | POA: Insufficient documentation

## 2015-02-08 DIAGNOSIS — Z8739 Personal history of other diseases of the musculoskeletal system and connective tissue: Secondary | ICD-10-CM | POA: Insufficient documentation

## 2015-02-08 DIAGNOSIS — X58XXXA Exposure to other specified factors, initial encounter: Secondary | ICD-10-CM | POA: Insufficient documentation

## 2015-02-08 MED ORDER — METHOCARBAMOL 500 MG PO TABS: 500.0000 mg | ORAL_TABLET | Freq: Two times a day (BID) | ORAL | Status: DC

## 2015-02-08 MED ORDER — OXYCODONE-ACETAMINOPHEN 5-325 MG PO TABS: 2.0000 | ORAL_TABLET | ORAL | Status: DC | PRN

## 2015-02-08 MED ORDER — OXYCODONE-ACETAMINOPHEN 5-325 MG PO TABS
2.0000 | ORAL_TABLET | Freq: Once | ORAL | Status: AC
Start: 2015-02-08 — End: 2015-02-08
  Administered 2015-02-08: 2 via ORAL
  Filled 2015-02-08: qty 2

## 2015-02-08 NOTE — Discharge Instructions (Signed)
Back Exercises Return for any urinary or bowel incontinence or retention, numbness, tingling, weakness in your lower extremities. Follow-up with her primary care physician. Back exercises help treat and prevent back injuries. The goal of back exercises is to increase the strength of your abdominal and back muscles and the flexibility of your back. These exercises should be started when you no longer have back pain. Back exercises include:  Pelvic Tilt. Lie on your back with your knees bent. Tilt your pelvis until the lower part of your back is against the floor. Hold this position 5 to 10 sec and repeat 5 to 10 times.  Knee to Chest. Pull first 1 knee up against your chest and hold for 20 to 30 seconds, repeat this with the other knee, and then both knees. This may be done with the other leg straight or bent, whichever feels better.  Sit-Ups or Curl-Ups. Bend your knees 90 degrees. Start with tilting your pelvis, and do a partial, slow sit-up, lifting your trunk only 30 to 45 degrees off the floor. Take at least 2 to 3 seconds for each sit-up. Do not do sit-ups with your knees out straight. If partial sit-ups are difficult, simply do the above but with only tightening your abdominal muscles and holding it as directed.  Hip-Lift. Lie on your back with your knees flexed 90 degrees. Push down with your feet and shoulders as you raise your hips a couple inches off the floor; hold for 10 seconds, repeat 5 to 10 times.  Back arches. Lie on your stomach, propping yourself up on bent elbows. Slowly press on your hands, causing an arch in your low back. Repeat 3 to 5 times. Any initial stiffness and discomfort should lessen with repetition over time.  Shoulder-Lifts. Lie face down with arms beside your body. Keep hips and torso pressed to floor as you slowly lift your head and shoulders off the floor. Do not overdo your exercises, especially in the beginning. Exercises may cause you some mild back discomfort  which lasts for a few minutes; however, if the pain is more severe, or lasts for more than 15 minutes, do not continue exercises until you see your caregiver. Improvement with exercise therapy for back problems is slow.  See your caregivers for assistance with developing a proper back exercise program. Document Released: 09/08/2004 Document Revised: 10/24/2011 Document Reviewed: 06/02/2011 [facility] Patient Information 2015 [place], [place]. This information is not intended to replace advice given to you by your health care provider. Make sure you discuss any questions you have with your health care provider.

## 2015-02-08 NOTE — ED Provider Notes (Signed)
CSN: [id number]     Arrival date & time 02/08/15  0920 History   First MD Initiated Contact with Patient 02/08/15 [phone]     Chief Complaint  Patient presents with  . Back Pain     (Consider location/radiation/quality/duration/timing/severity/associated sxs/prior Treatment) Patient is a 49 y.o. male presenting with back pain. The history is provided by the patient. No language interpreter was used.  Back Pain Associated symptoms: no fever   Jeffrey [name] is a 49 year old male with a history of diabetes, hyperlipidemia, anxiety, depression, carpal tunnel syndrome, hypertension, PUD, low back pain who presents with low back pain after lifting a compressor on Thursday. He states he felt a pop on the right side of his back above his hip. He states he has had back problems all of his life with degenerative disc disease. He states he had an MRI done at the end of last year which did not show anything significant was told to do exercises for back pain. He states he was given soma and Cymbalta by his PCP which has not helped in the past month. He states the pain was originally better when laying down but is now hard to get out of bed in the last 2 days. Patient states he is not very compliant with his glucose control and does not check his glucose often. He denies any fever, chills, weakness, numbness, tingling, urinary or bowel incontinence or retention. He denies any history of IV drug use, recent steroid use, cancer.  Past Medical History  Diagnosis Date  . DIABETES MELLITUS, TYPE II 07/05/2007  . HYPERLIPIDEMIA 07/05/2007  . ANXIETY 07/05/2007  . ERECTILE DYSFUNCTION 07/11/2007  . DEPRESSION 07/05/2007  . CARPAL TUNNEL SYNDROME, BILATERAL 07/05/2007  . Meralgia paresthetica 09/25/2008  . HYPERTENSION 07/05/2007  . SINUSITIS- ACUTE-NOS 03/26/2009  . GERD 07/11/2007  . PEPTIC ULCER DISEASE 07/05/2007  . SKIN LESION 03/18/2008  . Cervicalgia 03/18/2008  . LOW BACK PAIN 07/11/2007  . Allergy   . Cancer     Past Surgical History  Procedure Laterality Date  . Left wrist surgury/fracture  1985  . Bassel cell     Family History  Problem Relation Age of Onset  . Heart disease Maternal Grandmother   . Cancer Maternal Grandfather     colon cancer  . Diabetes Other   . Hypertension Other   . Diabetes Mother   . Hypertension Mother   . Diabetes Father   . Hypertension Father    History  Substance Use Topics  . Smoking status: Former Smoker    Types: Cigars    Quit date: 02/17/2014  . Smokeless tobacco: Never Used     Comment: occasional cigar  . Alcohol Use: 0.0 oz/week    0 Standard drinks or equivalent per week     Comment: social    Review of Systems  Constitutional: Negative for fever.  Musculoskeletal: Positive for back pain.      Allergies  Codeine  Home Medications   Prior to Admission medications   Medication Sig Start Date End Date Taking? Authorizing Provider  ALPRAZolam Duanne Moron) 1 MG tablet TAKE ONE-HALF TO ONE TABLET BY MOUTH THREE TIMES A DAY AS NEEDED 11/06/14   Biagio Borg, MD  aspirin 81 MG tablet Take 81 mg by mouth daily.      Historical Provider, MD  carisoprodol (SOMA) 350 MG tablet TAKE 1 TABLET BY MOUTH DAILY AS NEEDED 11/06/14   Biagio Borg, MD  citalopram (CELEXA) 10 MG tablet Take 1  tablet (10 mg total) by mouth daily. 05/24/13   Biagio Borg, MD  cyclobenzaprine (FLEXERIL) 10 MG tablet Take 1 tablet (10 mg total) by mouth 3 (three) times daily as needed for muscle spasms. 10/31/13   Roselee Culver, MD  fexofenadine (ALLEGRA) 180 MG tablet Take 1 tablet (180 mg total) by mouth daily. 08/30/13   Biagio Borg, MD  GLIPIZIDE XL 10 MG 24 hr tablet TAKE 1 TABLET (10 MG TOTAL) BY MOUTH DAILY WITH BREAKFAST. 12/08/14   Biagio Borg, MD  glucose blood (ONE TOUCH TEST STRIPS) test strip Use as instructed 05/24/13   Biagio Borg, MD  HYDROcodone-acetaminophen (NORCO) 5-325 MG per tablet Take 1-2 tablets by mouth every 4 (four) hours as needed for moderate  pain. 10/31/13   Roselee Culver, MD  lisinopril (PRINIVIL,ZESTRIL) 10 MG tablet Take 1 tablet (10 mg total) by mouth daily. 08/30/13   Biagio Borg, MD  metFORMIN (GLUCOPHAGE) 500 MG tablet Take 1 tablet (500 mg total) by mouth 2 (two) times daily. Take two tablets by mouth in the morning and take 2 tablets by mouth in the evening. 11/11/14   Biagio Borg, MD  methocarbamol (ROBAXIN) 500 MG tablet Take 1 tablet (500 mg total) by mouth 2 (two) times daily. 02/08/15   Velmer Broadfoot Patel-Mills, PA-C  oxyCODONE-acetaminophen (PERCOCET/ROXICET) 5-325 MG per tablet Take 2 tablets by mouth every 4 (four) hours as needed for severe pain. 02/08/15   Hargun Spurling Patel-Mills, PA-C  pioglitazone (ACTOS) 30 MG tablet TAKE 1 TABLET BY MOUTH DAILY 04/08/14   Biagio Borg, MD  pravastatin (PRAVACHOL) 40 MG tablet Take 1 tablet (40 mg total) by mouth daily. 05/24/13   Biagio Borg, MD  tadalafil (CIALIS) 20 MG tablet Take 1 tablet (20 mg total) by mouth daily as needed. 11/23/12   Biagio Borg, MD  Target Lancet Super Thin 30G MISC Use as directed 1 per day 05/24/13   Biagio Borg, MD  triamcinolone (NASACORT AQ) 55 MCG/ACT AERO nasal inhaler Place 2 sprays into the nose daily. 08/30/13   Biagio Borg, MD   BP 125/71 mmHg  Pulse 82  Temp(Src) 98 F (36.7 C) (Oral)  Resp 17  SpO2 97% Physical Exam  Constitutional: He is oriented to person, place, and time. He appears well-developed and well-nourished.  HENT:  Head: Normocephalic and atraumatic.  Eyes: Conjunctivae are normal.  Neck: Normal range of motion.  Cardiovascular: Normal rate.   Pulmonary/Chest: Effort normal.  Abdominal:  Obese.  Musculoskeletal: Normal range of motion.  He has a negative straight leg raise bilaterally. Patient is able to dorsi and plantar flex bilaterally without difficulty. No midline lumbar tenderness. He has tenderness to palpation above the right iliac crest. Good DP pulses bilaterally. NVI. No saddle anesthesia. Patient is ambulatory.   Neurological: He is alert and oriented to person, place, and time.  Skin: Skin is warm and dry.  Psychiatric: He has a normal mood and affect. His behavior is normal.  Nursing note and vitals reviewed.   ED Course  Procedures (including critical care time) Labs Review Labs Reviewed - No data to display  Imaging Review No results found.   EKG Interpretation None      MDM   Final diagnoses:  Right-sided low back pain without sciatica   I did not give the patient prednisone since he states he does not have tight glucose control and does not monitor it closely. Patient is unaware of kidney function  and does not have recent labs in the chart review. I did not recommend taking ibuprofen at this time. I gave the patient Robaxin and Percocet. I also gave the patient return precautions such as urinary or bowel incontinence or retention, numbness or tingling or weakness in the lower extremities. Patient verbally agrees with the plan and will follow up with his PCP.   Ottie Glazier, PA-C 02/08/15 1153  Carmin Muskrat, MD 02/08/15 [phone]

## 2015-02-08 NOTE — ED Notes (Signed)
Patient states on Thursday evening he was carrying a portable air compressor when he felt something pop in his Lower Right back. Patient states the only thing that helps is laying flat, states he is having difficulty getting in/out of bed. Denies sciatic pain. Hx DDD in spine, Hx of back pain.

## 2015-02-09 ENCOUNTER — Other Ambulatory Visit: Payer: Self-pay

## 2015-04-23 ENCOUNTER — Other Ambulatory Visit: Payer: Self-pay | Admitting: Internal Medicine

## 2015-05-25 ENCOUNTER — Other Ambulatory Visit: Payer: Self-pay | Admitting: Internal Medicine

## 2015-06-23 ENCOUNTER — Ambulatory Visit (INDEPENDENT_AMBULATORY_CARE_PROVIDER_SITE_OTHER): Payer: Self-pay | Admitting: Family Medicine

## 2015-06-23 VITALS — BP 138/74 | HR 86 | Temp 97.9°F | Resp 16 | Ht 71.0 in | Wt 285.0 lb

## 2015-06-23 DIAGNOSIS — Z024 Encounter for examination for driving license: Secondary | ICD-10-CM

## 2015-06-23 DIAGNOSIS — Z021 Encounter for pre-employment examination: Secondary | ICD-10-CM

## 2015-06-23 DIAGNOSIS — E119 Type 2 diabetes mellitus without complications: Secondary | ICD-10-CM

## 2015-06-23 LAB — GLUCOSE, POCT (MANUAL RESULT ENTRY): POC GLUCOSE: 141 mg/dL — AB (ref 70–99)

## 2015-06-23 NOTE — Patient Instructions (Signed)
1 year card, follow up with primary provider for blood pressure and diabetes.

## 2015-06-23 NOTE — Progress Notes (Addendum)
Subjective:  This chart was scribed for Jeffrey Ray MD, by Jeffrey [name], at [facility].  This patient was seen in room 14 and the patient's care was started at 1:43 PM.    Patient ID: Jeffrey [name], male    DOB: [birth year], 49 y.o.   MRN: [id number] Chief Complaint  Patient presents with  . Employment Physical    DOT    HPI  HPI Comments: Jeffrey [name] is a 49 y.o. male who presents to the [facility] for a DOT physical.  He has a history of hypertension, diabetes, hyperlipidemia, anxiety, carpal tunnel syndrome and back pain. Last DOT physical was November 2015.  1 year DOT card was given.  Last Hemoglobin A1C was 7.1 by our records (November 2015).   ----- Patient is not on insulin currently but he is taking medication for his diabetes.   He states that he occasionally feels dizzy/queezy in the mornings (when he first wakes up) due to his low blood sugar (2-3 times a month).  He is currently being seen at [facility] and will be going back to Dr. Jenny Reichmann. He last had his A1C checked last summer.  Patient denies any loss of vision.  He states he is due for an eye exam and will be scheduling one soon. He denies any difficulty with glare at night during driving.  He denies any history of glaucoma, heart attack, stroke, blockage of arteries.  Denies chest pains or dyspnea.  Patient is taking aspirin daily.   Possible hypoglycemia: He has not had symptomatic low blood sugars in the past year.    Intermittent back pain: Patient was told that he has DDD.  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  No current pain or need for mm relaxant or narcotic medication.   Denies OSA, daytime somnolence, and unknown if snoring. Had sleep study in past year or two from PCP that did not indicate OSA.    Patient Active Problem List   Diagnosis Date Noted  . Polycythemia, secondary  08/30/2013  . Allergic rhinitis, cause unspecified 08/30/2013  . Abnormal urine color 05/24/2013  . Right flank pain 05/24/2013  . Skin cancer, basal cell 12/27/2010  . Right shoulder pain 12/27/2010  . Right hand pain 12/27/2010  . Preventative health care 12/26/2010  . Meralgia paresthetica 09/25/2008  . Cervicalgia 03/18/2008  . ERECTILE DYSFUNCTION 07/11/2007  . GERD 07/11/2007  . LOW BACK PAIN 07/11/2007  . DIABETES MELLITUS, TYPE II 07/05/2007  . HYPERLIPIDEMIA 07/05/2007  . ANXIETY 07/05/2007  . DEPRESSION 07/05/2007  . CARPAL TUNNEL SYNDROME, BILATERAL 07/05/2007  . HYPERTENSION 07/05/2007  . PEPTIC ULCER DISEASE 07/05/2007   Past Medical History  Diagnosis Date  . DIABETES MELLITUS, TYPE II 07/05/2007  . HYPERLIPIDEMIA 07/05/2007  . ANXIETY 07/05/2007  . ERECTILE DYSFUNCTION 07/11/2007  . DEPRESSION 07/05/2007  . CARPAL TUNNEL SYNDROME, BILATERAL 07/05/2007  . Meralgia paresthetica 09/25/2008  . HYPERTENSION 07/05/2007  . SINUSITIS- ACUTE-NOS 03/26/2009  . GERD 07/11/2007  . PEPTIC ULCER DISEASE 07/05/2007  . SKIN LESION 03/18/2008  . Cervicalgia 03/18/2008  . LOW BACK PAIN 07/11/2007  . Allergy   . Cancer [facility])    Past Surgical History  Procedure Laterality Date  . Left wrist surgury/fracture  1985  . Bassel cell     Allergies  Allergen Reactions  . Codeine Itching   Prior to Admission  medications   Medication Sig Start Date End Date Taking? Authorizing Provider  aspirin 81 MG tablet Take 81 mg by mouth daily.     Yes Historical Provider, MD  GLIPIZIDE XL 10 MG 24 hr tablet TAKE 1 TABLET (10 MG TOTAL) BY MOUTH DAILY WITH BREAKFAST. 12/08/14  Yes Biagio Borg, MD  lisinopril (PRINIVIL,ZESTRIL) 10 MG tablet Take 1 tablet (10 mg total) by mouth daily. 08/30/13  Yes Biagio Borg, MD  metFORMIN (GLUCOPHAGE) 500 MG tablet Take 1 tablet (500 mg total) by mouth 2 (two) times daily. Take two tablets by mouth in the morning and take 2 tablets by mouth in the evening.  11/11/14  Yes Biagio Borg, MD  pioglitazone (ACTOS) 30 MG tablet TAKE 1 TABLET BY MOUTH DAILY 04/08/14  Yes Biagio Borg, MD  pravastatin (PRAVACHOL) 40 MG tablet Take 1 tablet (40 mg total) by mouth daily. 05/24/13  Yes Biagio Borg, MD  ALPRAZolam Duanne Moron) 1 MG tablet TAKE ONE-HALF TO ONE TABLET BY MOUTH THREE TIMES A DAY AS NEEDED Patient not taking: Reported on 06/23/2015 11/06/14   Biagio Borg, MD  carisoprodol (SOMA) 350 MG tablet TAKE 1 TABLET BY MOUTH DAILY AS NEEDED Patient not taking: Reported on 06/23/2015 11/06/14   Biagio Borg, MD  citalopram (CELEXA) 10 MG tablet Take 1 tablet (10 mg total) by mouth daily. Patient not taking: Reported on 06/23/2015 05/24/13   Biagio Borg, MD  cyclobenzaprine (FLEXERIL) 10 MG tablet Take 1 tablet (10 mg total) by mouth 3 (three) times daily as needed for muscle spasms. Patient not taking: Reported on 06/23/2015 10/31/13   Roselee Culver, MD  fexofenadine (ALLEGRA) 180 MG tablet Take 1 tablet (180 mg total) by mouth daily. Patient not taking: Reported on 06/23/2015 08/30/13   Biagio Borg, MD  glucose blood (ONE [facility] TEST STRIPS) test strip Use as instructed Patient not taking: Reported on 06/23/2015 05/24/13   Biagio Borg, MD  HYDROcodone-acetaminophen [facility]) 5-325 MG per tablet Take 1-2 tablets by mouth every 4 (four) hours as needed for moderate pain. Patient not taking: Reported on 06/23/2015 10/31/13   Roselee Culver, MD  methocarbamol (ROBAXIN) 500 MG tablet Take 1 tablet (500 mg total) by mouth 2 (two) times daily. Patient not taking: Reported on 06/23/2015 02/08/15   Ottie Glazier, PA-C  oxyCODONE-acetaminophen (PERCOCET/ROXICET) 5-325 MG per tablet Take 2 tablets by mouth every 4 (four) hours as needed for severe pain. Patient not taking: Reported on 06/23/2015 02/08/15   Ottie Glazier, PA-C  tadalafil (CIALIS) 20 MG tablet Take 1 tablet (20 mg total) by mouth daily as needed. Patient not taking: Reported on 06/23/2015 11/23/12   Biagio Borg, MD  Target Lancet Super Thin 30G MISC Use as directed 1 per day Patient not taking: Reported on 06/23/2015 05/24/13   Biagio Borg, MD  triamcinolone (NASACORT AQ) 55 MCG/ACT AERO nasal inhaler Place 2 sprays into the nose daily. Patient not taking: Reported on 06/23/2015 08/30/13   Biagio Borg, MD   Social History   Social History  . Marital Status: Married    Spouse Name: N/A  . Number of Children: 2  . 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 Smoking status: Former Smoker    Types: Cigars    Quit date: 02/17/2014  . Smokeless tobacco: Never Used     Comment:  occasional cigar  . Alcohol Use: 0.0 oz/week    0 Standard drinks or equivalent per week     Comment: social  . Drug Use: No  . Sexual Activity: Not on file   Other Topics Concern  . Not on file   Social History Narrative       Review of Systems  Constitutional: Negative for fever and chills.  Eyes: Negative for pain, redness and itching.  Respiratory: Negative for cough, choking and shortness of breath.   Musculoskeletal: Negative for neck pain and neck stiffness.  Neurological: Negative for syncope and speech difficulty.       Objective:   Physical Exam  Constitutional: He is oriented to person, place, and time. He appears well-developed and well-nourished. No distress.  HENT:  Head: Normocephalic and atraumatic.  Right Ear: External ear normal.  Left Ear: External ear normal.  Mouth/Throat: Oropharynx is clear and moist.  Eyes: Conjunctivae and EOM are normal. Pupils are equal, round, and reactive to light.  Neck: Normal range of motion. Neck supple. No thyromegaly present.  Cardiovascular: Normal rate, regular rhythm, normal heart sounds and intact distal pulses.   Pulmonary/Chest: Effort normal and breath sounds normal. No respiratory distress. He has no wheezes.  Abdominal: Soft. He exhibits no  distension. There is no tenderness. Hernia confirmed negative in the right inguinal area and confirmed negative in the left inguinal area.  Musculoskeletal: Normal range of motion. He exhibits no edema or tenderness.  Lymphadenopathy:    He has no cervical adenopathy.  Neurological: He is alert and oriented to person, place, and time. He has normal reflexes.  Skin: Skin is warm and dry.  Psychiatric: He has a normal mood and affect. His behavior is normal.  Nursing note and vitals reviewed.   Filed Vitals:   06/23/15 1316 06/23/15 1324  BP: 148/90 148/88  Pulse: 86   Temp: 97.9 F (36.6 C)   TempSrc: Oral   Resp: 16   Height: 5\' 11"  (1.803 m)   Weight: 285 lb (129.275 kg)   SpO2: 94%    Results for orders placed or performed in visit on 06/23/15  POCT glucose (manual entry)  Result Value Ref Range   POC Glucose 141 (A) 70 - 99 mg/dl  repeat BP 138/74    Assessment & Plan:  Jeffrey [name] is a 49 y.o. male Encounter for commercial driving license (CDL) exam  Controlled type 2 diabetes mellitus without complication, without long-term current use of insulin ([place]) - Plan: POCT glucose (manual entry) See dot paperwork.  1 year card provided. Follow up with PCP re: DM and HTN.   No orders of the defined types were placed in this encounter.   Patient Instructions  1 year card, follow up with primary provider for blood pressure and diabetes.      I personally performed the services described in this documentation, which was scribed in my presence. The recorded information has been reviewed and considered, and addended by me as needed.

## 2015-08-14 ENCOUNTER — Other Ambulatory Visit: Payer: No Typology Code available for payment source

## 2015-08-16 DIAGNOSIS — M1990 Unspecified osteoarthritis, unspecified site: Secondary | ICD-10-CM

## 2015-08-16 HISTORY — DX: Unspecified osteoarthritis, unspecified site: M19.90

## 2015-08-18 ENCOUNTER — Other Ambulatory Visit (INDEPENDENT_AMBULATORY_CARE_PROVIDER_SITE_OTHER): Payer: Managed Care, Other (non HMO)

## 2015-08-18 ENCOUNTER — Encounter: Payer: Self-pay | Admitting: Internal Medicine

## 2015-08-18 ENCOUNTER — Ambulatory Visit (INDEPENDENT_AMBULATORY_CARE_PROVIDER_SITE_OTHER): Payer: Managed Care, Other (non HMO) | Admitting: Internal Medicine

## 2015-08-18 VITALS — BP 130/80 | HR 85 | Temp 97.9°F | Ht 71.0 in | Wt 277.0 lb

## 2015-08-18 DIAGNOSIS — I10 Essential (primary) hypertension: Secondary | ICD-10-CM

## 2015-08-18 DIAGNOSIS — R7989 Other specified abnormal findings of blood chemistry: Secondary | ICD-10-CM

## 2015-08-18 DIAGNOSIS — D751 Secondary polycythemia: Secondary | ICD-10-CM | POA: Diagnosis not present

## 2015-08-18 DIAGNOSIS — E119 Type 2 diabetes mellitus without complications: Secondary | ICD-10-CM | POA: Diagnosis not present

## 2015-08-18 DIAGNOSIS — Z0000 Encounter for general adult medical examination without abnormal findings: Secondary | ICD-10-CM

## 2015-08-18 DIAGNOSIS — Z23 Encounter for immunization: Secondary | ICD-10-CM | POA: Diagnosis not present

## 2015-08-18 DIAGNOSIS — M25512 Pain in left shoulder: Secondary | ICD-10-CM | POA: Insufficient documentation

## 2015-08-18 LAB — MICROALBUMIN / CREATININE URINE RATIO
Creatinine,U: 202.2 mg/dL
MICROALB UR: 0.7 mg/dL (ref 0.0–1.9)
MICROALB/CREAT RATIO: 0.3 mg/g (ref 0.0–30.0)

## 2015-08-18 LAB — TSH: TSH: 1.07 u[IU]/mL (ref 0.35–4.50)

## 2015-08-18 LAB — BASIC METABOLIC PANEL
BUN: 13 mg/dL (ref 6–23)
CALCIUM: 9.5 mg/dL (ref 8.4–10.5)
CO2: 28 meq/L (ref 19–32)
Chloride: 102 mEq/L (ref 96–112)
Creatinine, Ser: 0.84 mg/dL (ref 0.40–1.50)
GFR: 102.17 mL/min (ref >60.00)
GLUCOSE: 178 mg/dL — AB (ref 70–99)
POTASSIUM: 4.6 meq/L (ref 3.5–5.1)
SODIUM: 137 meq/L (ref 135–145)

## 2015-08-18 LAB — HEPATIC FUNCTION PANEL
ALK PHOS: 59 U/L (ref 39–117)
ALT: 20 U/L (ref 0–53)
AST: 12 U/L (ref 0–37)
Albumin: 4.4 g/dL (ref 3.5–5.2)
BILIRUBIN DIRECT: 0.1 mg/dL (ref 0.0–0.3)
Total Bilirubin: 0.5 mg/dL (ref 0.2–1.2)
Total Protein: 6.4 g/dL (ref 6.0–8.3)

## 2015-08-18 LAB — URINALYSIS, ROUTINE W REFLEX MICROSCOPIC
Bilirubin Urine: NEGATIVE
HGB URINE DIPSTICK: NEGATIVE
Ketones, ur: NEGATIVE
Leukocytes, UA: NEGATIVE
Nitrite: NEGATIVE
PH: 6 (ref 5.0–8.0)
RBC / HPF: NONE SEEN (ref 0–?)
Specific Gravity, Urine: 1.02 (ref 1.000–1.030)
TOTAL PROTEIN, URINE-UPE24: NEGATIVE
Urine Glucose: 250 — AB
Urobilinogen, UA: 0.2 (ref 0.0–1.0)

## 2015-08-18 LAB — HEMOGLOBIN A1C: HEMOGLOBIN A1C: 7 % — AB (ref 4.6–6.5)

## 2015-08-18 LAB — CBC WITH DIFFERENTIAL/PLATELET
Basophils Absolute: 0 10*3/uL (ref 0.0–0.1)
Basophils Relative: 0.4 % (ref 0.0–3.0)
EOS PCT: 1 % (ref 0.0–5.0)
Eosinophils Absolute: 0.1 10*3/uL (ref 0.0–0.7)
HCT: 50.4 % (ref 39.0–52.0)
Hemoglobin: 17.2 g/dL — ABNORMAL HIGH (ref 13.0–17.0)
LYMPHS ABS: 0.8 10*3/uL (ref 0.7–4.0)
Lymphocytes Relative: 14.5 % (ref 12.0–46.0)
MCHC: 34 g/dL (ref 30.0–36.0)
MCV: 90.6 fl (ref 78.0–100.0)
MONO ABS: 0.5 10*3/uL (ref 0.1–1.0)
MONOS PCT: 9.5 % (ref 3.0–12.0)
NEUTROS PCT: 74.6 % (ref 43.0–77.0)
Neutro Abs: 3.9 10*3/uL (ref 1.4–7.7)
PLATELETS: 236 10*3/uL (ref 150.0–400.0)
RBC: 5.57 Mil/uL (ref 4.22–5.81)
RDW: 13.1 % (ref 11.5–15.5)
WBC: 5.2 10*3/uL (ref 4.0–10.5)

## 2015-08-18 LAB — LIPID PANEL
Cholesterol: 169 mg/dL (ref 0–200)
HDL: 31.4 mg/dL — AB (ref >39.00)
NONHDL: 137.12
Total CHOL/HDL Ratio: 5
Triglycerides: 218 mg/dL — ABNORMAL HIGH (ref 0.0–149.0)
VLDL: 43.6 mg/dL — ABNORMAL HIGH (ref 0.0–40.0)

## 2015-08-18 LAB — PSA: PSA: 0.34 ng/mL (ref 0.10–4.00)

## 2015-08-18 LAB — LDL CHOLESTEROL, DIRECT: Direct LDL: 90 mg/dL

## 2015-08-18 MED ORDER — ALPRAZOLAM 1 MG PO TABS: ORAL_TABLET | ORAL | Status: DC

## 2015-08-18 MED ORDER — METFORMIN HCL 500 MG PO TABS: ORAL_TABLET | ORAL | Status: DC

## 2015-08-18 MED ORDER — PRAVASTATIN SODIUM 40 MG PO TABS
40.0000 mg | ORAL_TABLET | Freq: Every day | ORAL | Status: DC
Start: 2015-08-18 — End: 2015-08-18

## 2015-08-18 MED ORDER — PRAVASTATIN SODIUM 40 MG PO TABS: 40.0000 mg | ORAL_TABLET | Freq: Every day | ORAL | Status: DC

## 2015-08-18 MED ORDER — GLIPIZIDE ER 10 MG PO TB24: ORAL_TABLET | ORAL | Status: DC

## 2015-08-18 MED ORDER — ATORVASTATIN CALCIUM 20 MG PO TABS: 20.0000 mg | ORAL_TABLET | Freq: Every day | ORAL | Status: DC

## 2015-08-18 MED ORDER — PIOGLITAZONE HCL 45 MG PO TABS: 30.0000 mg | ORAL_TABLET | Freq: Every day | ORAL | Status: DC

## 2015-08-18 MED ORDER — CARISOPRODOL 350 MG PO TABS: ORAL_TABLET | ORAL | Status: DC

## 2015-08-18 MED ORDER — LISINOPRIL 20 MG PO TABS: 20.0000 mg | ORAL_TABLET | Freq: Every day | ORAL | Status: DC

## 2015-08-18 MED ORDER — METFORMIN HCL 500 MG PO TABS: 1000.0000 mg | ORAL_TABLET | Freq: Two times a day (BID) | ORAL | Status: DC

## 2015-08-18 NOTE — Patient Instructions (Addendum)
You had the flu shot today  Please return in 2 weeks for a Nurse Visit for the Prevnar pneumonia shot  You will be contacted regarding the referral for: colonoscopy  Please continue all other medications as before, and refills have been done if requested - the xanax and soma  Please have the pharmacy call with any other refills you may need.  Please continue your efforts at being more active, low cholesterol diet, and weight control.  You are otherwise up to date with prevention measures today.  You will be contacted regarding the referral for: Dr Tamala Julian, for the left shoulder  Please keep your appointments with your specialists as you may have planned  Please go to the LAB in the Basement (turn left off the elevator) for the tests to be done today  You will be contacted by phone if any changes need to be made immediately.  Otherwise, you will receive a letter about your results with an explanation, but please check with MyChart first.  Please remember to sign up for MyChart if you have not done so, as this will be important to you in the future with finding out test results, communicating by private email, and scheduling acute appointments online when needed.  Please return in 6 months, or sooner if needed, with Lab testing done 3-5 days before

## 2015-08-18 NOTE — Progress Notes (Signed)
Pre visit review using our clinic review tool, if applicable. No additional management support is needed unless otherwise documented below in the visit note. 

## 2015-08-18 NOTE — Assessment & Plan Note (Addendum)

## 2015-08-18 NOTE — Assessment & Plan Note (Signed)
stable overall by history and exam, recent data reviewed with pt, and pt to continue medical treatment as before,  to f/u any worsening symptoms or concerns Lab Results  Component Value Date   HGBA1C 7.1 06/23/2014

## 2015-08-18 NOTE — Assessment & Plan Note (Signed)
?   DJD vs other, for alleve prn, .also refer Dr Smith/[facility] for eval

## 2015-08-18 NOTE — Assessment & Plan Note (Signed)
stable overall by history and exam, recent data reviewed with pt, and pt to continue medical treatment as before,  to f/u any worsening symptoms or concerns BP Readings from Last 3 Encounters:  08/18/15 130/80  06/23/15 138/74  02/08/15 125/71

## 2015-08-18 NOTE — Progress Notes (Signed)
   Subjective:    Patient ID: Jeffrey [name], male    DOB: 1966/07/22, 50 y.o.   MRN: [id number]  HPI    Review of Systems     Objective:   Physical Exam        Assessment & Plan:

## 2015-08-18 NOTE — Assessment & Plan Note (Signed)
ONO neg about jan 2015, for f/u lab today, consider heme referral

## 2015-08-18 NOTE — Progress Notes (Signed)
Subjective:    Patient ID: Jeffrey [name], male    DOB: 1966/04/05, 50 y.o.   MRN: [id number]  HPI  Here for wellness and f/u;  Overall doing ok;  Pt denies Chest pain, worsening SOB, DOE, wheezing, orthopnea, PND, worsening LE edema, palpitations, dizziness or syncope.  Pt denies neurological change such as new headache, facial or extremity weakness.  Pt denies polydipsia, polyuria, or low sugar symptoms. Pt states overall good compliance with treatment and medications, good tolerability, and has been trying to follow appropriate diet.  Pt denies worsening depressive symptoms, suicidal ideation or panic. No fever, night sweats, wt loss, loss of appetite, or other constitutional symptoms.  Pt states good ability with ADL's, has low fall risk, home safety reviewed and adequate, no other significant changes in hearing or vision, and only occasionally active with exercise.  Last a1c approx 7.1 last yr.  Now back with insurance from nov 1, could not afford prior, has new job, works for [employer] and receiving. Has been [facility] by [facility].  Quit smoking x 1.5 yrs (prior small cigars up to 100 per wk, for > 10 yrs).   Has been having 2 mo worsening left shoudler pain and crunching noticeable with lifting more than a few lbs though still has adequate ROM.  Past Medical History  Diagnosis Date  . DIABETES MELLITUS, TYPE II 07/05/2007  . HYPERLIPIDEMIA 07/05/2007  . ANXIETY 07/05/2007  . ERECTILE DYSFUNCTION 07/11/2007  . DEPRESSION 07/05/2007  . CARPAL TUNNEL SYNDROME, BILATERAL 07/05/2007  . Meralgia paresthetica 09/25/2008  . HYPERTENSION 07/05/2007  . SINUSITIS- ACUTE-NOS 03/26/2009  . GERD 07/11/2007  . PEPTIC ULCER DISEASE 07/05/2007  . SKIN LESION 03/18/2008  . Cervicalgia 03/18/2008  . LOW BACK PAIN 07/11/2007  . Allergy   . Cancer [facility])    Past Surgical History  Procedure Laterality Date  . Left wrist surgury/fracture  1985  . Bassel cell      reports that he  quit smoking about 17 months ago. His smoking use included Cigars. He has never used smokeless tobacco. He reports that he drinks alcohol. He reports that he does not use illicit drugs. family history includes Cancer in his maternal grandfather; Diabetes in his father, mother, and other; Heart disease in his maternal grandmother; Hypertension in his father, mother, and other. Allergies  Allergen Reactions  . Codeine Itching   Current Outpatient Prescriptions on File Prior to Visit  Medication Sig Dispense Refill  . aspirin 81 MG tablet Take 81 mg by mouth daily.       No current facility-administered medications on file prior to visit.   Review of Systems Constitutional: Negative for increased diaphoresis, other activity, appetite or siginficant weight change other than noted HENT: Negative for worsening hearing loss, ear pain, facial swelling, mouth sores and neck stiffness.   Eyes: Negative for other worsening pain, redness or visual disturbance.  Respiratory: Negative for shortness of breath and wheezing  Cardiovascular: Negative for chest pain and palpitations.  Gastrointestinal: Negative for diarrhea, blood in stool, abdominal distention or other pain Genitourinary: Negative for hematuria, flank pain or change in urine volume.  Musculoskeletal: Negative for myalgias or other joint complaints.  Skin: Negative for color change and wound or drainage.  Neurological: Negative for syncope and numbness. other than noted Hematological: Negative for adenopathy. or other swelling Psychiatric/Behavioral: Negative for hallucinations, SI, self-injury, decreased concentration or other worsening agitation.      Objective:   Physical Exam BP 130/80  mmHg  Pulse 85  Temp(Src) 97.9 F (36.6 C) (Oral)  Ht 5\' 11"  (1.803 m)  Wt 277 lb (125.646 kg)  BMI 38.65 kg/m2  SpO2 95% VS noted,  Constitutional: Pt is oriented to person, place, and time. Appears well-developed and well-nourished, in no  significant distress Head: Normocephalic and atraumatic.  Right Ear: External ear normal.  Left Ear: External ear normal.  Nose: Nose normal.  Mouth/Throat: Oropharynx is clear and moist.  Eyes: Conjunctivae and EOM are normal. Pupils are equal, round, and reactive to light.  Neck: Normal range of motion. Neck supple. No JVD present. No tracheal deviation present or significant neck LA or mass Cardiovascular: Normal rate, regular rhythm, normal heart sounds and intact distal pulses.   Pulmonary/Chest: Effort normal and breath sounds without rales or wheezing  Abdominal: Soft. Bowel sounds are normal. NT. No HSM  Musculoskeletal: Normal range of motion. Exhibits no edema.  Lymphadenopathy:  Has no cervical adenopathy.  Neurological: Pt is alert and oriented to person, place, and time. Pt has normal reflexes. No cranial nerve deficit. Motor grossly intact Skin: Skin is warm and dry. No rash noted.  Psychiatric:  Has normal mood and affect. Behavior is normal.     Assessment & Plan:

## 2015-08-24 ENCOUNTER — Encounter: Payer: Self-pay | Admitting: Gastroenterology

## 2015-08-31 ENCOUNTER — Ambulatory Visit (INDEPENDENT_AMBULATORY_CARE_PROVIDER_SITE_OTHER): Payer: Managed Care, Other (non HMO)

## 2015-08-31 DIAGNOSIS — Z23 Encounter for immunization: Secondary | ICD-10-CM

## 2015-09-07 ENCOUNTER — Ambulatory Visit (INDEPENDENT_AMBULATORY_CARE_PROVIDER_SITE_OTHER): Payer: Managed Care, Other (non HMO) | Admitting: Family Medicine

## 2015-09-07 ENCOUNTER — Encounter: Payer: Self-pay | Admitting: Family Medicine

## 2015-09-07 ENCOUNTER — Ambulatory Visit (INDEPENDENT_AMBULATORY_CARE_PROVIDER_SITE_OTHER)
Admission: RE | Admit: 2015-09-07 | Discharge: 2015-09-07 | Disposition: A | Discharge status: Home / Self Care | Payer: Managed Care, Other (non HMO) | Source: Ambulatory Visit | Attending: Family Medicine | Admitting: Family Medicine

## 2015-09-07 ENCOUNTER — Other Ambulatory Visit (INDEPENDENT_AMBULATORY_CARE_PROVIDER_SITE_OTHER): Payer: Managed Care, Other (non HMO)

## 2015-09-07 VITALS — BP 134/84 | HR 72 | Wt 278.0 lb

## 2015-09-07 DIAGNOSIS — M7550 Bursitis of unspecified shoulder: Secondary | ICD-10-CM | POA: Insufficient documentation

## 2015-09-07 DIAGNOSIS — M7552 Bursitis of left shoulder: Secondary | ICD-10-CM

## 2015-09-07 DIAGNOSIS — M5010 Cervical disc disorder with radiculopathy, unspecified cervical region: Secondary | ICD-10-CM | POA: Diagnosis not present

## 2015-09-07 DIAGNOSIS — M25512 Pain in left shoulder: Secondary | ICD-10-CM

## 2015-09-07 MED ORDER — MELOXICAM 15 MG PO TABS: 15.0000 mg | ORAL_TABLET | Freq: Every day | ORAL | Status: DC

## 2015-09-07 MED ORDER — GABAPENTIN 100 MG PO CAPS: 200.0000 mg | ORAL_CAPSULE | Freq: Every day | ORAL | Status: DC

## 2015-09-07 NOTE — Assessment & Plan Note (Signed)
I do believe the patient's most likely contributing factor at this point is cervical radiculopathy. With patient's limited range of motion and positive Spurling sign concern for nerve root impingement. Unable to do prednisone secondary to patient's diabetes. Patient does have a history of peptic ulcer disease but states he has not had any difficulty for quite some time. Patient is going to try a anti-inflammatory that was prescribed today. X-rays are pending. Differential also includes a subacromial bursitis which patient was found to have an ultrasound and injection that did relieve some of the pain. We discussed with patient if any be constant and occurs that he needs to be seen immediately. Otherwise patient will do home exercises, icing and the medication as prescribed above and will come back and see me again in 3-4 weeks. If no improvement we may need to consider advanced imaging at that time.

## 2015-09-07 NOTE — Patient Instructions (Signed)
Good to see you xrays downstairs today and I will release them in mychart.  Meloxicam daily for 10 days but stop if it hurts your stomach.  Gabapentin 100mg  at night for 3 nights then 200mg  thereafter.  Ice 20 minutes 2 times daily. Usually after activity and before bed. Exercises 3 times a week.  On wall with heels, butt shoulder and head touching for a goal of 5 minutes daily  See me again in 10-14 days to make sure you are making progress

## 2015-09-07 NOTE — Assessment & Plan Note (Signed)
Patient given ultrasound guided injection today and tolerated the procedure very well. Did have some resolution of pain. I do believe though that cervical radiculopathy is likely the primary problem. We discussed home exercises, postural changes, prescriptions were given. Patient will come back and see me again in 3 weeks for further evaluation. X-rays pending

## 2015-09-07 NOTE — Progress Notes (Signed)
[name] [address], [place] [address] Phone: [phone] Subjective:    I'm seeing this patient by the request  of:  Cathlean Cower, MD  CC: Left arm pain  [id number] Jeffrey [name] is a 50 y.o. male coming in with complaint of left arm and shoulder pain. Patient states that this pain is been going on for multiple months if not years. Patient does not remember any specific injury. An states that it seems to be worsening and affecting some of his daily activities. States that it seems to be radiating down towards his elbow as well as up towards his neck. States that he can have the pain in the shoulder without the pain in the neck as well as vice versa. Rates the severity pain is 8 out of 10. Can wake him up at night. States that if he moves his arm in certain range of motion and seems to hurt more or if he tries to pick up something heavy. States that it feels better when he puts his arm on top of his head. Would take 9 any significant weakness. Has been able to continue to work at this time.     Past Medical History  Diagnosis Date  . DIABETES MELLITUS, TYPE II 07/05/2007  . HYPERLIPIDEMIA 07/05/2007  . ANXIETY 07/05/2007  . ERECTILE DYSFUNCTION 07/11/2007  . DEPRESSION 07/05/2007  . CARPAL TUNNEL SYNDROME, BILATERAL 07/05/2007  . Meralgia paresthetica 09/25/2008  . HYPERTENSION 07/05/2007  . SINUSITIS- ACUTE-NOS 03/26/2009  . GERD 07/11/2007  . PEPTIC ULCER DISEASE 07/05/2007  . SKIN LESION 03/18/2008  . Cervicalgia 03/18/2008  . LOW BACK PAIN 07/11/2007  . Allergy   . Cancer [facility])    Past Surgical History  Procedure Laterality Date  . Left wrist surgury/fracture  1985  . Bassel cell     Social History   Social History  . Marital Status: Married    Spouse Name: N/A  . Number of Children: 2  . 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  Smoking status: Former Smoker    Types: Cigars    Quit date: 02/17/2014  . Smokeless tobacco: Never Used     Comment: occasional cigar  . Alcohol Use: 0.0 oz/week    0 Standard drinks or equivalent per week     Comment: social  . Drug Use: No  . Sexual Activity: Not on file   Other Topics Concern  . Not on file   Social History Narrative   Allergies  Allergen Reactions  . Codeine Itching   Family History  Problem Relation Age of Onset  . Heart disease Maternal Grandmother   . Cancer Maternal Grandfather     colon cancer  . Diabetes Other   . Hypertension Other   . Diabetes Mother   . Hypertension Mother   . Diabetes Father   . Hypertension Father     Past medical history, social, surgical and family history all reviewed in electronic medical record.  No pertanent information unless stated regarding to the chief complaint.   Review of Systems: No headache, visual changes, nausea, vomiting, diarrhea, constipation, dizziness, abdominal pain, skin rash, fevers, chills, night sweats, weight loss, swollen lymph nodes, body aches, joint swelling, muscle aches, chest pain, shortness of breath, mood changes.   Objective Blood pressure 134/84, pulse 72, weight 278 lb (126.1 kg).  General: No apparent  distress alert and oriented x3 mood and affect normal, dressed appropriately.  HEENT: Pupils equal, extraocular movements intact  Respiratory: Patient's speak in full sentences and does not appear short of breath  Cardiovascular: No lower extremity edema, non tender, no erythema  Skin: Warm dry intact with no signs of infection or rash on extremities or on axial skeleton.  Abdomen: Soft nontender  Neuro: Cranial nerves II through XII are intact, neurovascularly intact in all extremities with 2+ DTRs and 2+ pulses.  Lymph: No lymphadenopathy of posterior or anterior cervical chain or axillae bilaterally.  Gait normal with good balance and coordination.  MSK:  Non tender with full  range of motion and good stability and symmetric strength and tone of  elbows, wrist, hip, knee and ankles bilaterally.  Neck: Inspection unremarkable. No palpable stepoffs. Positive Spurling's maneuver especially left-sided. Significant decreased range of motion with only 5 of extension and 5 of side bending bilaterally but full flexion Grip strength and sensation normal in bilateral hands Strength good C4 to T1 distribution No sensory change to C4 to T1 Negative Hoffman sign bilaterally Reflexes normal Shoulder: left Inspection reveals no abnormalities, atrophy or asymmetry. Palpation is normal with no tenderness over AC joint or bicipital groove. ROM is full in all planes passively. Rotator cuff strength normal throughout. signs of impingement with positive Neer and Hawkin's tests, but negative empty can sign. Speeds and Yergason's tests normal. No labral pathology noted with negative Obrien's, negative clunk and good stability. Normal scapular function observed. No painful arc and no drop arm sign. No apprehension sign  MSK US performed of: left This study was ordered, performed, and interpreted by Charlann Boxer D.O.  Shoulder:   Supraspinatus:  Appears normal on long and transverse views with mild intersubstance tearing noted, Bursal bulge seen with shoulder abduction on impingement view. Infraspinatus:  Appears normal on long and transverse views. Significant increase in Doppler flow Subscapularis:  Appears normal on long and transverse views. Positive bursa Teres Minor:  Appears normal on long and transverse views. AC joint:  Mild arthritic changes Glenohumeral Joint:  Appears normal without effusion. Glenoid Labrum:  Intact without visualized tears. Biceps Tendon:  Appears normal on long and transverse views, no fraying of tendon, tendon located in intertubercular groove, no subluxation with shoulder internal or external rotation.  Impression: Subacromial  bursitis  Procedure: Real-time Ultrasound Guided Injection of left glenohumeral joint Device: GE Logiq E  Ultrasound guided injection is preferred based studies that show increased duration, increased effect, greater accuracy, decreased procedural pain, increased response rate with ultrasound guided versus blind injection.  Verbal informed consent obtained.  Time-out conducted.  Noted no overlying erythema, induration, or other signs of local infection.  Skin prepped in a sterile fashion.  Local anesthesia: Topical Ethyl chloride.  With sterile technique and under real time ultrasound guidance:  Joint visualized.  23g 1  inch needle inserted posterior approach. Pictures taken for needle placement. Patient did have injection of 2 cc of 1% lidocaine, 2 cc of 0.5% Marcaine, and 1.0 cc of Kenalog 40 mg/dL. Completed without difficulty  Pain immediately resolved suggesting accurate placement of the medication.  Advised to call if fevers/chills, erythema, induration, drainage, or persistent bleeding.  Images permanently stored and available for review in the ultrasound unit.  Impression: Technically successful ultrasound guided injection.     Impression and Recommendations:     This case required medical decision making of moderate complexity.      Note: This dictation was prepared with  Dragon dictation along with smaller Company secretary. Any transcriptional errors that result from this process are unintentional.

## 2015-09-21 ENCOUNTER — Encounter: Payer: Self-pay | Admitting: Family Medicine

## 2015-09-21 ENCOUNTER — Ambulatory Visit (INDEPENDENT_AMBULATORY_CARE_PROVIDER_SITE_OTHER): Payer: Managed Care, Other (non HMO) | Admitting: Family Medicine

## 2015-09-21 VITALS — BP 128/80 | HR 76 | Wt 273.0 lb

## 2015-09-21 DIAGNOSIS — M5010 Cervical disc disorder with radiculopathy, unspecified cervical region: Secondary | ICD-10-CM | POA: Diagnosis not present

## 2015-09-21 MED ORDER — GABAPENTIN 300 MG PO CAPS: 300.0000 mg | ORAL_CAPSULE | Freq: Every day | ORAL | Status: DC

## 2015-09-21 MED ORDER — PREDNISONE 50 MG PO TABS: 50.0000 mg | ORAL_TABLET | Freq: Every day | ORAL | Status: DC

## 2015-09-21 NOTE — Progress Notes (Signed)
[name] [address] [place], [place] [address] Phone: [phone] Subjective:    I'm seeing this patient by the request  of:  Cathlean Cower, MD  CC: Left arm pain follow-up  [id number] Jeffrey [name] is a 50 y.o. male coming in with complaint of left arm and shoulder pain. P patient was found to have more of a mild subacromial bursitis but also concern for more of a cervical radiculopathy. Patient did have x-rays after last visit. These were adequately review an visualized by me today. Patient had a moderate arthritic changes of the shoulder as well as neck. Patient was put on gabapentin. Patient also was given an injection of the shoulder. Patient states he did feel much better for approximately 3-7 days and then the pain returned. Seems to be more of a numbing sensation in his hands. States also and itching sensation. Maybe some increase and neck pain as well. Denies any new injuries.    Past Medical History  Diagnosis Date  . DIABETES MELLITUS, TYPE II 07/05/2007  . HYPERLIPIDEMIA 07/05/2007  . ANXIETY 07/05/2007  . ERECTILE DYSFUNCTION 07/11/2007  . DEPRESSION 07/05/2007  . CARPAL TUNNEL SYNDROME, BILATERAL 07/05/2007  . Meralgia paresthetica 09/25/2008  . HYPERTENSION 07/05/2007  . SINUSITIS- ACUTE-NOS 03/26/2009  . GERD 07/11/2007  . PEPTIC ULCER DISEASE 07/05/2007  . SKIN LESION 03/18/2008  . Cervicalgia 03/18/2008  . LOW BACK PAIN 07/11/2007  . Allergy   . Cancer [facility])    Past Surgical History  Procedure Laterality Date  . Left wrist surgury/fracture  1985  . Bassel cell     Social History   Social History  . Marital Status: Married    Spouse Name: N/A  . Number of Children: 2  . 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 Smoking status: Former Smoker    Types: Cigars    Quit date: 02/17/2014  . Smokeless tobacco: Never Used   Comment: occasional cigar  . Alcohol Use: 0.0 oz/week    0 Standard drinks or equivalent per week     Comment: social  . Drug Use: No  . Sexual Activity: Not Asked   Other Topics Concern  . None   Social History Narrative   Allergies  Allergen Reactions  . Codeine Itching   Family History  Problem Relation Age of Onset  . Heart disease Maternal Grandmother   . Cancer Maternal Grandfather     colon cancer  . Diabetes Other   . Hypertension Other   . Diabetes Mother   . Hypertension Mother   . Diabetes Father   . Hypertension Father     Past medical history, social, surgical and family history all reviewed in electronic medical record.  No pertanent information unless stated regarding to the chief complaint.   Review of Systems: No headache, visual changes, nausea, vomiting, diarrhea, constipation, dizziness, abdominal pain, skin rash, fevers, chills, night sweats, weight loss, swollen lymph nodes, body aches, joint swelling, muscle aches, chest pain, shortness of breath, mood changes.   Objective Blood pressure 128/80, pulse 76, weight 273 lb (123.832 kg).  General: No apparent distress alert and oriented x3 mood and affect normal, dressed appropriately.  HEENT: Pupils equal, extraocular movements intact  Respiratory: Patient's speak in full sentences and does not appear short of breath  Cardiovascular: No lower extremity edema, non tender, no erythema  Skin: Warm dry  intact with no signs of infection or rash on extremities or on axial skeleton.  Abdomen: Soft nontender  Neuro: Cranial nerves II through XII are intact, neurovascularly intact in all extremities with 2+ DTRs and 2+ pulses.  Lymph: No lymphadenopathy of posterior or anterior cervical chain or axillae bilaterally.  Gait normal with good balance and coordination.  MSK:  Non tender with full range of motion and good stability and symmetric strength and tone of  elbows, wrist, hip, knee and ankles bilaterally.    Neck: Inspection unremarkable. No palpable stepoffs. Positive Spurling's maneuver especially left-sided.worse than last time. Significant decreased range of motion with only 5 of extension and 5 of side bending bilaterally but full flexion Grip strength and sensation normal in bilateral hands Strength good C4 to T1 distribution No sensory change to C4 to T1 Negative Hoffman sign bilaterally Reflexes normal Shoulder: left Inspection reveals no abnormalities, atrophy or asymmetry. Palpation is normal with no tenderness over AC joint or bicipital groove. ROM is full in all planes passively. Rotator cuff strength normal throughout. Mild impingement still remaining Speeds and Yergason's tests normal. No labral pathology noted with negative Obrien's, negative clunk and good stability. Normal scapular function observed. No painful arc and no drop arm sign. No apprehension sign      Impression and Recommendations:     This case required medical decision making of moderate complexity.      Note: This dictation was prepared with Dragon dictation along with smaller phrase technology. Any transcriptional errors that result from this process are unintentional.

## 2015-09-21 NOTE — Patient Instructions (Addendum)
Good to see you  I do think it is your neck.  Ice 20 minutes 2 times daily. Usually after activity and before bed. We will get you into PT  Prednisone daily for 5 days  We will increase gabapentin to 300mg  at night Turmeric 500mg  twice daily Vitamin D 2000 IU daily See me again in 3 weeks to make sure we are making progress or contact me if you feel we need the MRI sooner.

## 2015-09-21 NOTE — Assessment & Plan Note (Signed)
Concern patient is having more of a cervical radiculopathy. Patient not responding to the injection in the shoulder significantly at think this would be the most likely contribute intact her. Patient on prednisone daily for the next 5 days. Gabapentin we will increase to 300 mg at night. We warned of potential side effects. We discussed stopping the anti-inflammatories during this time. We discussed some over-the-counter medications a could be beneficial. Discussed postural exercises and avoiding certain activities. Patient sent to formal physical therapy. Will follow-up in 3 weeks or will call sooner if any worsening numbness or weakness of the left upper extremity occurs. I would feel that patient makes no significant improvement or any worsening then advance imaging including MRI of the cervical spine would be necessary.  Spent  25 minutes with patient face-to-face and had greater than 50% of counseling including as described above in assessment and plan.

## 2015-09-22 ENCOUNTER — Encounter: Payer: Self-pay | Admitting: Internal Medicine

## 2015-09-22 LAB — HM DIABETES EYE EXAM

## 2015-09-28 ENCOUNTER — Ambulatory Visit (AMBULATORY_SURGERY_CENTER): Payer: Self-pay

## 2015-09-28 ENCOUNTER — Encounter: Payer: Self-pay | Admitting: Internal Medicine

## 2015-09-28 VITALS — Ht 71.0 in | Wt 272.0 lb

## 2015-09-28 DIAGNOSIS — Z1211 Encounter for screening for malignant neoplasm of colon: Secondary | ICD-10-CM

## 2015-09-28 MED ORDER — PIOGLITAZONE HCL 45 MG PO TABS: 45.0000 mg | ORAL_TABLET | Freq: Every day | ORAL | Status: DC

## 2015-09-28 MED ORDER — NA SULFATE-K SULFATE-MG SULF 17.5-3.13-1.6 GM/177ML PO SOLN: 1.0000 | Freq: Once | ORAL | Status: DC

## 2015-09-28 NOTE — Progress Notes (Signed)
No egg or soy allergies Not on home 02 No previous anesthesia complications Emmi video emailed to [email or web address]. No diet or weight loss meds

## 2015-10-07 ENCOUNTER — Encounter: Payer: Self-pay | Admitting: Family Medicine

## 2015-10-12 ENCOUNTER — Encounter: Payer: Self-pay | Admitting: Gastroenterology

## 2015-10-12 ENCOUNTER — Ambulatory Visit (AMBULATORY_SURGERY_CENTER): Payer: Managed Care, Other (non HMO) | Admitting: Gastroenterology

## 2015-10-12 VITALS — BP 133/77 | HR 83 | Temp 97.5°F | Resp 16 | Ht 71.0 in | Wt 272.0 lb

## 2015-10-12 DIAGNOSIS — Z1211 Encounter for screening for malignant neoplasm of colon: Secondary | ICD-10-CM | POA: Diagnosis present

## 2015-10-12 MED ORDER — SODIUM CHLORIDE 0.9 % IV SOLN: 500.0000 mL | INTRAVENOUS | Status: DC

## 2015-10-12 NOTE — Patient Instructions (Signed)
YOU HAD AN ENDOSCOPIC PROCEDURE TODAY AT THE [facility]:   Refer to the procedure report that was given to you for any specific questions about what was found during the examination.  If the procedure report does not answer your questions, please call your gastroenterologist to clarify.  If you requested that your care partner not be given the details of your procedure findings, then the procedure report has been included in a sealed envelope for you to review at your convenience later.  YOU SHOULD EXPECT: Some feelings of bloating in the abdomen. Passage of more gas than usual.  Walking can help get rid of the air that was put into your GI tract during the procedure and reduce the bloating. If you had a lower endoscopy (such as a colonoscopy or flexible sigmoidoscopy) you may notice spotting of blood in your stool or on the toilet paper. If you underwent a bowel prep for your procedure, you may not have a normal bowel movement for a few days.  Please Note:  You might notice some irritation and congestion in your nose or some drainage.  This is from the oxygen used during your procedure.  There is no need for concern and it should clear up in a day or so.  SYMPTOMS TO REPORT IMMEDIATELY:   Following lower endoscopy (colonoscopy or flexible sigmoidoscopy):  Excessive amounts of blood in the stool  Significant tenderness or worsening of abdominal pains  Swelling of the abdomen that is new, acute  Fever of 100F or higher   For urgent or emergent issues, a gastroenterologist can be reached at any hour by calling [phone].   DIET: Your first meal following the procedure should be a small meal and then it is ok to progress to your normal diet. Heavy or fried foods are harder to digest and may make you feel nauseous or bloated.  Likewise, meals heavy in dairy and vegetables can increase bloating.  Drink plenty of fluids but you should avoid alcoholic beverages for 24  hours.  ACTIVITY:  You should plan to take it easy for the rest of today and you should NOT DRIVE or use heavy machinery until tomorrow (because of the sedation medicines used during the test).    FOLLOW UP: Our staff will call the number listed on your records the next business day following your procedure to check on you and address any questions or concerns that you may have regarding the information given to you following your procedure. If we do not reach you, we will leave a message.  However, if you are feeling well and you are not experiencing any problems, there is no need to return our call.  We will assume that you have returned to your regular daily activities without incident.  If any biopsies were taken you will be contacted by phone or by letter within the next 1-3 weeks.  Please call us at [phone] if you have not heard about the biopsies in 3 weeks.    SIGNATURES/CONFIDENTIALITY: You and/or your care partner have signed paperwork which will be entered into your electronic medical record.  These signatures attest to the fact that that the information above on your After Visit Summary has been reviewed and is understood.  Full responsibility of the confidentiality of this discharge information lies with you and/or your care-partner. 

## 2015-10-12 NOTE — Progress Notes (Signed)
A/ox3, pleased with MAC, report to RN 

## 2015-10-12 NOTE — Op Note (Signed)
[facility]  [address] [place], [id number]   COLONOSCOPY PROCEDURE REPORT  PATIENT: Jeffrey [name], Jeffrey [name]  MR#: [id number] BIRTHDATE: 11/10/1965 , 86  yrs. old GENDER: male ENDOSCOPIST: Wilfrid Lund, MD REFERRED [id number] Jeneen Rinks, MD PROCEDURE DATE:  10/12/2015 PROCEDURE:   Colonoscopy, screening  ASA CLASS:   Class II INDICATIONS:Screening for colonic neoplasia and Colorectal Neoplasm Risk Assessment for this procedure is average risk. MEDICATIONS: Monitored anesthesia care and Propofol   700 mg  DESCRIPTION OF PROCEDURE:   After the risks benefits and alternatives of the procedure were thoroughly explained, informed consent was obtained.  The digital rectal exam revealed no abnormalities of the rectum.   The LB [id number]  endoscope was introduced through the anus and advanced to the cecum, which was identified by both the appendix and ileocecal valve. No adverse events experienced.   The quality of the prep was good.  (Suprep was used)  The instrument was then slowly withdrawn as the colon was fully examined. Estimated blood loss is zero unless otherwise noted in this procedure report.      COLON FINDINGS: A normal appearing cecum, ileocecal valve, and appendiceal orifice were identified.  The ascending, transverse, descending, sigmoid colon, and rectum appeared unremarkable. the colon was markedly redundant, causing scope looping. This required repositioning to the back and external pressure in order to reach the cecum Retroflexed views revealed no abnormalities. The time to cecum = 14.9 Withdrawal time = 8.2   The scope was withdrawn and the procedure completed. COMPLICATIONS: There were no immediate complications.  ENDOSCOPIC IMPRESSION: Normal colonoscopy  RECOMMENDATIONS: Repeat colonoscopy in 10 years  eSigned:  Wilfrid Lund, MD 10/12/2015 8:41 AM   cc:

## 2015-10-13 ENCOUNTER — Telehealth: Payer: Self-pay | Admitting: *Deleted

## 2015-10-13 NOTE — Telephone Encounter (Signed)
  Follow up Call-  Call back number 10/12/2015  Post procedure Call Back phone  # [phone]  Permission to leave phone message Yes     Patient questions:  Do you have a fever, pain , or abdominal swelling? No. Pain Score  0 *  Have you tolerated food without any problems? Yes.    Have you been able to return to your normal activities? Yes.    Do you have any questions about your discharge instructions: Diet   No. Medications  No. Follow up visit  No.  Do you have questions or concerns about your Care? No.  Actions: * If pain score is 4 or above: No action needed, pain <4.

## 2015-10-19 ENCOUNTER — Encounter: Payer: Self-pay | Admitting: Family Medicine

## 2015-10-19 ENCOUNTER — Ambulatory Visit (INDEPENDENT_AMBULATORY_CARE_PROVIDER_SITE_OTHER): Payer: Managed Care, Other (non HMO) | Admitting: Family Medicine

## 2015-10-19 VITALS — BP 122/78 | HR 79 | Ht 71.0 in | Wt 273.0 lb

## 2015-10-19 DIAGNOSIS — M5010 Cervical disc disorder with radiculopathy, unspecified cervical region: Secondary | ICD-10-CM | POA: Diagnosis not present

## 2015-10-19 MED ORDER — GABAPENTIN 100 MG PO CAPS: 200.0000 mg | ORAL_CAPSULE | Freq: Two times a day (BID) | ORAL | Status: DC

## 2015-10-19 MED ORDER — GABAPENTIN 300 MG PO CAPS: 300.0000 mg | ORAL_CAPSULE | Freq: Every day | ORAL | Status: DC

## 2015-10-19 NOTE — Patient Instructions (Signed)
Good to see yo u Ice is your friend when needed Gabapentin 100mg  in AM, 100mg  in PM and 300mg  at night Continue the vitamins Keep the exercises going 2-3 times a week Send me a message in 3 weeks and if we need improvement we can increase gabapentin

## 2015-10-19 NOTE — Progress Notes (Signed)
Pre visit review using our clinic review tool, if applicable. No additional management support is needed unless otherwise documented below in the visit note. 

## 2015-10-19 NOTE — Progress Notes (Signed)
[name] [address] [place], [place] [address] Phone: [phone] Subjective:    I'm seeing this patient by the request  of:  Cathlean Cower, MD  CC: Left arm pain follow-up  [id number] Jeffrey [name] is a 50 y.o. male coming in with complaint of left arm and shoulder pain. Patient was found to have cervical radiculopathy. Has responded very well to home exercises, gabapentin, and has had no side effects. Patient states that he is feeling 80% better. No radicular symptoms down the arm at this time. No numbness or weakness. Overall feeling significantly better.    Past Medical History  Diagnosis Date  . DIABETES MELLITUS, TYPE II 07/05/2007  . HYPERLIPIDEMIA 07/05/2007  . ANXIETY 07/05/2007  . ERECTILE DYSFUNCTION 07/11/2007  . DEPRESSION 07/05/2007  . CARPAL TUNNEL SYNDROME, BILATERAL 07/05/2007  . Meralgia paresthetica 09/25/2008  . HYPERTENSION 07/05/2007  . SINUSITIS- ACUTE-NOS 03/26/2009  . GERD 07/11/2007  . PEPTIC ULCER DISEASE 07/05/2007  . SKIN LESION 03/18/2008  . Cervicalgia 03/18/2008  . LOW BACK PAIN 07/11/2007  . Allergy   . Cancer ([place])     basal cell  . Arthritis 08/2015    neck and back   Past Surgical History  Procedure Laterality Date  . Left wrist surgury/fracture  1985  . Basal cell carcinoma excision Right 2011    shoulder area   Social History   Social History  . Marital Status: Married    Spouse Name: N/A  . Number of Children: 2  . 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 Smoking status: Former Smoker    Types: Cigars    Quit date: 02/17/2014  . Smokeless tobacco: Current User     Comment: ecigs  . Alcohol Use: 0.0 oz/week    0 Standard drinks or equivalent per week     Comment: social  . Drug Use: No  . Sexual Activity: Not on file   Other Topics Concern  . Not on file   Social History Narrative    Allergies  Allergen Reactions  . Codeine Itching   Family History  Problem Relation Age of Onset  . Heart disease Maternal Grandmother   . Cancer Maternal Grandfather     colon cancer  . Diabetes Other   . Hypertension Other   . Diabetes Mother   . Hypertension Mother   . Diabetes Father   . Hypertension Father   . Colon cancer Neg Hx     Past medical history, social, surgical and family history all reviewed in electronic medical record.  No pertanent information unless stated regarding to the chief complaint.   Review of Systems: No headache, visual changes, nausea, vomiting, diarrhea, constipation, dizziness, abdominal pain, skin rash, fevers, chills, night sweats, weight loss, swollen lymph nodes, body aches, joint swelling, muscle aches, chest pain, shortness of breath, mood changes.   Objective There were no vitals taken for this visit.  General: No apparent distress alert and oriented x3 mood and affect normal, dressed appropriately.  HEENT: Pupils equal, extraocular movements intact  Respiratory: Patient's speak in full sentences and does not appear short of breath  Cardiovascular: No lower extremity edema, non tender, no erythema  Skin: Warm dry intact with no signs of infection or rash on extremities or on axial skeleton.  Abdomen: Soft nontender  Neuro: Cranial nerves II through XII are intact, neurovascularly intact in  all extremities with 2+ DTRs and 2+ pulses.  Lymph: No lymphadenopathy of posterior or anterior cervical chain or axillae bilaterally.  Gait normal with good balance and coordination.  MSK:  Non tender with full range of motion and good stability and symmetric strength and tone of  elbows, wrist, hip, knee and ankles bilaterally.  Neck: Inspection unremarkable. No palpable stepoffs. Positive Spurling's Still positive Significant decreased range of motion with only 5 of extension and 5 of side bending bilaterally but full flexion Grip strength and  sensation normal in bilateral hands Strength good C4 to T1 distribution No sensory change to C4 to T1 Negative Hoffman sign bilaterally Reflexes normal Shoulder: left Inspection reveals no abnormalities, atrophy or asymmetry. Palpation is normal with no tenderness over AC joint or bicipital groove. ROM is full in all planes passively. Rotator cuff strength normal throughout. No impingement signs noted Speeds and Yergason's tests normal. No labral pathology noted with negative Obrien's, negative clunk and good stability. Normal scapular function observed. No painful arc and no drop arm sign. No apprehension sign    Impression and Recommendations:     This case required medical decision making of moderate complexity.      Note: This dictation was prepared with Dragon dictation along with smaller phrase technology. Any transcriptional errors that result from this process are unintentional.

## 2015-10-19 NOTE — Assessment & Plan Note (Signed)
Patient does have moderate to severe arthritic changes of the neck. I do think the patient does have a nerve root impingement that occurs from time to time on the left side. Rate is responding well to the gabapentin and we will increase. Patient given new cushion today. We warned the potential side effect. We discussed with patient has exacerbations what to do. Patient and will follow-up with me again in 3-6 weeks for further evaluation. Patient has a referral to physical therapy but does not want to do it secondary to cost.

## 2015-11-06 ENCOUNTER — Other Ambulatory Visit: Payer: Self-pay | Admitting: Internal Medicine

## 2015-11-06 NOTE — Telephone Encounter (Signed)
Please advise 

## 2015-11-06 NOTE — Telephone Encounter (Signed)
Done hardcopy to Corinne  

## 2015-11-06 NOTE — Telephone Encounter (Signed)
Medication sent to pharmacy  

## 2015-11-09 ENCOUNTER — Encounter: Payer: Self-pay | Admitting: Family Medicine

## 2015-11-30 ENCOUNTER — Ambulatory Visit (INDEPENDENT_AMBULATORY_CARE_PROVIDER_SITE_OTHER): Payer: Managed Care, Other (non HMO) | Admitting: Family Medicine

## 2015-11-30 ENCOUNTER — Encounter: Payer: Self-pay | Admitting: Family Medicine

## 2015-11-30 VITALS — BP 118/82 | HR 88 | Ht 71.0 in | Wt 277.0 lb

## 2015-11-30 DIAGNOSIS — M7552 Bursitis of left shoulder: Secondary | ICD-10-CM | POA: Diagnosis not present

## 2015-11-30 DIAGNOSIS — M5010 Cervical disc disorder with radiculopathy, unspecified cervical region: Secondary | ICD-10-CM

## 2015-11-30 MED ORDER — GABAPENTIN 300 MG PO CAPS: 300.0000 mg | ORAL_CAPSULE | Freq: Two times a day (BID) | ORAL | Status: DC

## 2015-11-30 MED ORDER — NITROGLYCERIN 0.2 MG/HR TD PT24: MEDICATED_PATCH | TRANSDERMAL | Status: DC

## 2015-11-30 NOTE — Patient Instructions (Addendum)
Good to see you  Gabapentin 300mg  2 times daily  Ice still after activity to the shoulder.  Does look like you hurt the shoulder a little.  Nitroglycerin Protocol   Apply 1/4 nitroglycerin patch to affected area daily.  Change position of patch within the affected area every 24 hours.  You may experience a headache during the first 1-2 weeks of using the patch, these should subside.  If you experience headaches after beginning nitroglycerin patch treatment, you may take your preferred over the counter pain reliever.  Another side effect of the nitroglycerin patch is skin irritation or rash related to patch adhesive.  Please notify our office if you develop more severe headaches or rash, and stop the patch.  Tendon healing with nitroglycerin patch may require 12 to 24 weeks depending on the extent of injury.  Men should not use if taking Viagra, Cialis, or Levitra.   Do not use if you have migraines or rosacea.   See me again in 4 weeks and if not better we will try to inject the shoulder again.  If neck is worse we may need to consider MRI as well.

## 2015-11-30 NOTE — Progress Notes (Signed)
[name] [address] [place], [place] [address] Phone: [phone] Subjective:    I'm seeing this patient by the request  of:  Cathlean Cower, MD  CC: Left arm pain follow-up  [id number] LUMEN GIRARD is a 50 y.o. male coming in with complaint of left arm and shoulder pain. Patient was found to have cervical radiculopathy. Has responded very well to home exercises, gabapentin, and has had no side effects. Patient is doing approximately 80% better. Patient did have an increase of his gabapentin after last visit. Patient was to continue with conservative therapy. Patient states continues to have some mild neck pain. No side effects to the gabapentin. Would state that he has plateaued from previous exam. Would not consider him worse though with the neck.No side effects to the gabapentin  Patient is also complaining of recurrent left shoulder pain. Was found to have a bursitis. Given an injection 3 months ago. Starts to give him discomfort mostly in the mornings. States that it seems to be very stiff. States that it seems to be worsening a little bit over time.    Past Medical History  Diagnosis Date  . DIABETES MELLITUS, TYPE II 07/05/2007  . HYPERLIPIDEMIA 07/05/2007  . ANXIETY 07/05/2007  . ERECTILE DYSFUNCTION 07/11/2007  . DEPRESSION 07/05/2007  . CARPAL TUNNEL SYNDROME, BILATERAL 07/05/2007  . Meralgia paresthetica 09/25/2008  . HYPERTENSION 07/05/2007  . SINUSITIS- ACUTE-NOS 03/26/2009  . GERD 07/11/2007  . PEPTIC ULCER DISEASE 07/05/2007  . SKIN LESION 03/18/2008  . Cervicalgia 03/18/2008  . LOW BACK PAIN 07/11/2007  . Allergy   . Cancer ([place])     basal cell  . Arthritis 08/2015    neck and back   Past Surgical History  Procedure Laterality Date  . Left wrist surgury/fracture  1985  . Basal cell carcinoma excision Right 2011    shoulder area   Social History   Social History  . Marital Status: Married    Spouse Name: N/A  . Number  of Children: 2  . 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 Smoking status: Former Smoker    Types: Cigars    Quit date: 02/17/2014  . Smokeless tobacco: Current User     Comment: ecigs  . Alcohol Use: 0.0 oz/week    0 Standard drinks or equivalent per week     Comment: social  . Drug Use: No  . Sexual Activity: Not Asked   Other Topics Concern  . None   Social History Narrative   Allergies  Allergen Reactions  . Codeine Itching   Family History  Problem Relation Age of Onset  . Heart disease Maternal Grandmother   . Cancer Maternal Grandfather     colon cancer  . Diabetes Other   . Hypertension Other   . Diabetes Mother   . Hypertension Mother   . Diabetes Father   . Hypertension Father   . Colon cancer Neg Hx     Past medical history, social, surgical and family history all reviewed in electronic medical record.  No pertanent information unless stated regarding to the chief complaint.   Review of Systems: No headache, visual changes, nausea, vomiting, diarrhea, constipation, dizziness, abdominal pain, skin rash, fevers, chills, night sweats, weight loss, swollen lymph nodes, body aches, joint swelling, muscle aches, chest pain, shortness of breath, mood changes.   Objective Blood pressure 118/82,  pulse 88, height 5\' 11"  (1.803 m), weight 277 lb (125.646 kg), SpO2 97 %.  General: No apparent distress alert and oriented x3 mood and affect normal, dressed appropriately.  HEENT: Pupils equal, extraocular movements intact  Respiratory: Patient's speak in full sentences and does not appear short of breath  Cardiovascular: No lower extremity edema, non tender, no erythema  Skin: Warm dry intact with no signs of infection or rash on extremities or on axial skeleton.  Abdomen: Soft nontender  Neuro: Cranial nerves II through XII are intact, neurovascularly intact in all  extremities with 2+ DTRs and 2+ pulses.  Lymph: No lymphadenopathy of posterior or anterior cervical chain or axillae bilaterally.  Gait normal with good balance and coordination.  MSK:  Non tender with full range of motion and good stability and symmetric strength and tone of  elbows, wrist, hip, knee and ankles bilaterally.  Neck: Inspection unremarkable. No palpable stepoffs. Positive Spurling's Still positive Significant decreased range of motion with only 5 of extension and 5 of side bending bilaterally but full flexion Grip strength and sensation normal in bilateral hands Strength good C4 to T1 distribution No sensory change to C4 to T1 Negative Hoffman sign bilaterally Reflexes normal No significant change from previous exam Shoulder: left Inspection reveals no abnormalities, atrophy or asymmetry. Palpation is normal with no tenderness over AC joint or bicipital groove. ROM is full in all planes passively. Rotator cuff strength normal throughout. Positive impingement Speeds and Yergason's tests normal. Positive O'Brien's which is new Normal scapular function observed. No painful arc and no drop arm sign. No apprehension sign    Impression and Recommendations:     This case required medical decision making of moderate complexity.      Note: This dictation was prepared with Dragon dictation along with smaller phrase technology. Any transcriptional errors that result from this process are unintentional.

## 2015-11-30 NOTE — Progress Notes (Signed)
Pre visit review using our clinic review tool, if applicable. No additional management support is needed unless otherwise documented below in the visit note. 

## 2015-11-30 NOTE — Assessment & Plan Note (Signed)
Has known degenerative disc disease. We discussed again the radicular symptoms most likely. Patient wants to hold on any advanced imaging. Increase patient's gabapentin 600 mg divided in 300 mg twice daily. We discussed continuing the postural changes. Patient will continue to monitor. Worsening symptoms advance imaging would be warranted. Decline formal physical therapy.

## 2015-11-30 NOTE — Assessment & Plan Note (Signed)
Continues to have mild discomfort. Mild impingement signs noted. Questionable labral pathology noted today on exam. Worsening symptoms we can repeat injection otherwise advance imaging would be warranted here as well. Prescription for nitroglycerin given. Warned of potential side effects.

## 2016-01-04 ENCOUNTER — Ambulatory Visit: Payer: Managed Care, Other (non HMO) | Admitting: Family Medicine

## 2016-01-04 DIAGNOSIS — Z0289 Encounter for other administrative examinations: Secondary | ICD-10-CM

## 2016-01-14 ENCOUNTER — Other Ambulatory Visit: Payer: Self-pay | Admitting: Internal Medicine

## 2016-01-14 NOTE — Telephone Encounter (Signed)
Please advise 

## 2016-01-14 NOTE — Telephone Encounter (Signed)
Faxed script back to [facility].../lmb 

## 2016-01-14 NOTE — Telephone Encounter (Signed)
Done hardcopy to Corinne  

## 2016-01-22 ENCOUNTER — Other Ambulatory Visit: Payer: Self-pay

## 2016-01-22 ENCOUNTER — Encounter: Payer: Self-pay | Admitting: Family Medicine

## 2016-01-22 ENCOUNTER — Ambulatory Visit (INDEPENDENT_AMBULATORY_CARE_PROVIDER_SITE_OTHER): Payer: Managed Care, Other (non HMO) | Admitting: Family Medicine

## 2016-01-22 VITALS — BP 134/80 | HR 88 | Ht 71.0 in | Wt 277.0 lb

## 2016-01-22 DIAGNOSIS — M5010 Cervical disc disorder with radiculopathy, unspecified cervical region: Secondary | ICD-10-CM | POA: Diagnosis not present

## 2016-01-22 DIAGNOSIS — S46112A Strain of muscle, fascia and tendon of long head of biceps, left arm, initial encounter: Secondary | ICD-10-CM

## 2016-01-22 DIAGNOSIS — M25512 Pain in left shoulder: Secondary | ICD-10-CM

## 2016-01-22 DIAGNOSIS — M2550 Pain in unspecified joint: Secondary | ICD-10-CM | POA: Insufficient documentation

## 2016-01-22 NOTE — Assessment & Plan Note (Signed)
Patient does have significant arthritis for someone his age. Discussed with patient about possible laboratory workup with him having worsening pain of his other joints. Patient did agree to this. We will check for any other pathology that could be contributing to his pain and will change management accordingly.

## 2016-01-22 NOTE — Patient Instructions (Signed)
Good to see you  Ice is your friend We injected shoulder again.  Continue the gabapentin  Send me a message in 4 weeks and if doing well then will get you off the nitro slowly.

## 2016-01-22 NOTE — Progress Notes (Signed)
[name] [address] [place], [place] [address] Phone: [phone] Subjective:    I'm seeing this patient by the request  of:  Cathlean Cower, MD  CC: Left arm pain follow-up  [id number] Jeffrey [name] is a 50 y.o. male coming in with complaint of left arm and shoulder pain. Patient was found to have cervical radiculopathy. Has responded very well to home exercises, gabapentin, and has had no side effects. We increased his gabapentin and continues to do better. Patient denies any numbness. Denies any radiation down the arm. Overall feels about 99% better and has had increasing range of motion.  Patient is also complaining of recurrent left shoulder pain. Was found to have a bursitis. Patient was given an injection back in January. Patient was also found to have more of a labral pathology on physical exam. Was started on nitroglycerin. No side effects to the nitroglycerin and only about 10-15% improvement in the shoulder. Still has some pain that seems to be waking him up sometimes at night. States his only when he externally rotates the arm and usually again some type of force.  Patient is also complaining of some polyarthralgia. States that many different joints are hurting more prickling. Has had history of polycythemia. Has had known arthritic changes of multiple other joints. Patient states that it seems to be worsening over the course of time. Wondering what else that could be done.  Past Medical History  Diagnosis Date  . DIABETES MELLITUS, TYPE II 07/05/2007  . HYPERLIPIDEMIA 07/05/2007  . ANXIETY 07/05/2007  . ERECTILE DYSFUNCTION 07/11/2007  . DEPRESSION 07/05/2007  . CARPAL TUNNEL SYNDROME, BILATERAL 07/05/2007  . Meralgia paresthetica 09/25/2008  . HYPERTENSION 07/05/2007  . SINUSITIS- ACUTE-NOS 03/26/2009  . GERD 07/11/2007  . PEPTIC ULCER DISEASE 07/05/2007  . SKIN LESION 03/18/2008  . Cervicalgia 03/18/2008  . LOW BACK PAIN 07/11/2007  .  Allergy   . Cancer ([place])     basal cell  . Arthritis 08/2015    neck and back   Past Surgical History  Procedure Laterality Date  . Left wrist surgury/fracture  1985  . Basal cell carcinoma excision Right 2011    shoulder area   Social History   Social History  . Marital Status: Married    Spouse Name: N/A  . Number of Children: 2  . 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 Smoking status: Former Smoker    Types: Cigars    Quit date: 02/17/2014  . Smokeless tobacco: Current User     Comment: ecigs  . Alcohol Use: 0.0 oz/week    0 Standard drinks or equivalent per week     Comment: social  . Drug Use: No  . Sexual Activity: Not Asked   Other Topics Concern  . None   Social History Narrative   Allergies  Allergen Reactions  . Codeine Itching   Family History  Problem Relation Age of Onset  . Heart disease Maternal Grandmother   . Cancer Maternal Grandfather     colon cancer  . Diabetes Other   . Hypertension Other   . Diabetes Mother   . Hypertension Mother   . Diabetes Father   . Hypertension Father   . Colon cancer Neg Hx     Past medical history, social, surgical and family history all reviewed in electronic medical record.  No pertanent information  unless stated regarding to the chief complaint.   Review of Systems: No headache, visual changes, nausea, vomiting, diarrhea, constipation, dizziness, abdominal pain, skin rash, fevers, chills, night sweats, weight loss, swollen lymph nodes, body aches, joint swelling, muscle aches, chest pain, shortness of breath, mood changes.   Objective Blood pressure 134/80, pulse 88, height 5\' 11"  (1.803 m), weight 277 lb (125.646 kg), SpO2 97 %.  General: No apparent distress alert and oriented x3 mood and affect normal, dressed appropriately.  HEENT: Pupils equal, extraocular movements intact  Respiratory: Patient's speak  in full sentences and does not appear short of breath  Cardiovascular: No lower extremity edema, non tender, no erythema  Skin: Warm dry intact with no signs of infection or rash on extremities or on axial skeleton.  Abdomen: Soft nontender  Neuro: Cranial nerves II through XII are intact, neurovascularly intact in all extremities with 2+ DTRs and 2+ pulses.  Lymph: No lymphadenopathy of posterior or anterior cervical chain or axillae bilaterally.  Gait normal with good balance and coordination.  MSK:  Non tender with full range of motion and good stability and symmetric strength and tone of  elbows, wrist, hip, knee and ankles bilaterally. Patient does have some diffuse tenderness of multiple joints. Neck: Inspection unremarkable. No palpable stepoffs. Still with pain with Spurling's maneuver but no radicular symptoms. Significant decreased range of motion with only 5 of extension and 5 of side bending bilaterally but full flexion seems stable Grip strength and sensation normal in bilateral hands Strength good C4 to T1 distribution No sensory change to C4 to T1 Negative Hoffman sign bilaterally Reflexes normal Improvement from previous exam. Shoulder: left Inspection reveals no abnormalities, atrophy or asymmetry. Palpation is normal with no tenderness over AC joint or bicipital groove. ROM is full in all planes passively. Rotator cuff strength normal throughout. Positive impingement Speeds and Yergason's tests normal. Positive O'Brien's still present Normal scapular function observed. No painful arc and no drop arm sign. No apprehension sign  Procedure: Real-time Ultrasound Guided Injection of left glenohumeral joint Device: GE Logiq E  Ultrasound guided injection is preferred based studies that show increased duration, increased effect, greater accuracy, decreased procedural pain, increased response rate with ultrasound guided versus blind injection.  Verbal informed consent  obtained.  Time-out conducted.  Noted no overlying erythema, induration, or other signs of local infection.  Skin prepped in a sterile fashion.  Local anesthesia: Topical Ethyl chloride.  With sterile technique and under real time ultrasound guidance:  Joint visualized.  23g 1  inch needle inserted posterior approach. Pictures taken for needle placement. Patient did have injection of 2 cc of 1% lidocaine, 2 cc of 0.5% Marcaine, and 1cc of Kenalog 40 mg/dL. Completed without difficulty  Pain immediately resolved suggesting accurate placement of the medication.  Advised to call if fevers/chills, erythema, induration, drainage, or persistent bleeding.  Images permanently stored and available for review in the ultrasound unit.  Impression: Technically successful ultrasound guided injection.     Impression and Recommendations:     This case required medical decision making of moderate complexity.      Note: This dictation was prepared with Dragon dictation along with smaller phrase technology. Any transcriptional errors that result from this process are unintentional.

## 2016-01-22 NOTE — Assessment & Plan Note (Signed)
Seems stable at this time. We will continue to monitor. If any worsening radicular symptoms we'll consider further workup. At the moment though patient has responded very well to the gabapentin dosing.

## 2016-01-22 NOTE — Progress Notes (Signed)
Pre visit review using our clinic review tool, if applicable. No additional management support is needed unless otherwise documented below in the visit note. 

## 2016-01-22 NOTE — Assessment & Plan Note (Signed)
Given injection today. Tolerated the procedure well. Patient will continue with the nitroglycerin. As long as he continues to improve we will start titrating down off a nitroglycerin in the next 4 weeks. Encourage patient continue the home exercises and we discussed lifting mechanics.

## 2016-02-17 ENCOUNTER — Ambulatory Visit: Payer: Managed Care, Other (non HMO) | Admitting: Internal Medicine

## 2016-02-22 ENCOUNTER — Other Ambulatory Visit (INDEPENDENT_AMBULATORY_CARE_PROVIDER_SITE_OTHER): Payer: Managed Care, Other (non HMO)

## 2016-02-22 DIAGNOSIS — M25512 Pain in left shoulder: Secondary | ICD-10-CM | POA: Diagnosis not present

## 2016-02-22 DIAGNOSIS — E119 Type 2 diabetes mellitus without complications: Secondary | ICD-10-CM

## 2016-02-22 LAB — CBC WITH DIFFERENTIAL/PLATELET
BASOS ABS: 0 10*3/uL (ref 0.0–0.1)
Basophils Relative: 0.8 % (ref 0.0–3.0)
EOS ABS: 0.1 10*3/uL (ref 0.0–0.7)
EOS PCT: 1.9 % (ref 0.0–5.0)
HCT: 50.3 % (ref 39.0–52.0)
HEMOGLOBIN: 17.2 g/dL — AB (ref 13.0–17.0)
Lymphocytes Relative: 26 % (ref 12.0–46.0)
Lymphs Abs: 1.3 10*3/uL (ref 0.7–4.0)
MCHC: 34.2 g/dL (ref 30.0–36.0)
MCV: 91.8 fl (ref 78.0–100.0)
MONO ABS: 0.6 10*3/uL (ref 0.1–1.0)
Monocytes Relative: 12.7 % — ABNORMAL HIGH (ref 3.0–12.0)
Neutro Abs: 2.9 10*3/uL (ref 1.4–7.7)
Neutrophils Relative %: 58.6 % (ref 43.0–77.0)
Platelets: 233 10*3/uL (ref 150.0–400.0)
RBC: 5.48 Mil/uL (ref 4.22–5.81)
RDW: 13.6 % (ref 11.5–15.5)
WBC: 4.9 10*3/uL (ref 4.0–10.5)

## 2016-02-22 LAB — HEPATIC FUNCTION PANEL
ALBUMIN: 4.3 g/dL (ref 3.5–5.2)
ALK PHOS: 44 U/L (ref 39–117)
ALT: 17 U/L (ref 0–53)
AST: 12 U/L (ref 0–37)
BILIRUBIN DIRECT: 0.1 mg/dL (ref 0.0–0.3)
TOTAL PROTEIN: 6.4 g/dL (ref 6.0–8.3)
Total Bilirubin: 0.5 mg/dL (ref 0.2–1.2)

## 2016-02-22 LAB — TSH: TSH: 0.88 u[IU]/mL (ref 0.35–4.50)

## 2016-02-22 LAB — BASIC METABOLIC PANEL
BUN: 12 mg/dL (ref 6–23)
CO2: 29 meq/L (ref 19–32)
Calcium: 9.2 mg/dL (ref 8.4–10.5)
Chloride: 104 mEq/L (ref 96–112)
Creatinine, Ser: 0.85 mg/dL (ref 0.40–1.50)
GFR: 100.59 mL/min (ref >60.00)
GLUCOSE: 110 mg/dL — AB (ref 70–99)
POTASSIUM: 4.3 meq/L (ref 3.5–5.1)
SODIUM: 140 meq/L (ref 135–145)

## 2016-02-22 LAB — LIPID PANEL
CHOLESTEROL: 148 mg/dL (ref 0–200)
HDL: 36.3 mg/dL — ABNORMAL LOW (ref >39.00)
LDL Cholesterol: 95 mg/dL (ref 0–99)
NonHDL: 111.86
Total CHOL/HDL Ratio: 4
Triglycerides: 86 mg/dL (ref 0.0–149.0)
VLDL: 17.2 mg/dL (ref 0.0–40.0)

## 2016-02-22 LAB — SEDIMENTATION RATE: SED RATE: 1 mm/h (ref 0–20)

## 2016-02-22 LAB — VITAMIN D 25 HYDROXY (VIT D DEFICIENCY, FRACTURES): VITD: 18.93 ng/mL — AB (ref 30.00–100.00)

## 2016-02-22 LAB — HEMOGLOBIN A1C: HEMOGLOBIN A1C: 6.9 % — AB (ref 4.6–6.5)

## 2016-02-22 LAB — C-REACTIVE PROTEIN: CRP: 0.1 mg/dL — AB (ref 0.5–20.0)

## 2016-02-23 LAB — ANA: ANA: NEGATIVE

## 2016-02-23 LAB — CYCLIC CITRUL PEPTIDE ANTIBODY, IGG: Cyclic Citrullin Peptide Ab: <16 Units

## 2016-02-24 ENCOUNTER — Encounter: Payer: Self-pay | Admitting: Internal Medicine

## 2016-02-24 ENCOUNTER — Telehealth: Payer: Self-pay

## 2016-02-24 ENCOUNTER — Ambulatory Visit (INDEPENDENT_AMBULATORY_CARE_PROVIDER_SITE_OTHER): Payer: Managed Care, Other (non HMO) | Admitting: Internal Medicine

## 2016-02-24 VITALS — BP 140/82 | HR 83 | Temp 98.4°F | Resp 20 | Wt 279.0 lb

## 2016-02-24 DIAGNOSIS — E119 Type 2 diabetes mellitus without complications: Secondary | ICD-10-CM | POA: Diagnosis not present

## 2016-02-24 DIAGNOSIS — R6889 Other general symptoms and signs: Secondary | ICD-10-CM

## 2016-02-24 DIAGNOSIS — E559 Vitamin D deficiency, unspecified: Secondary | ICD-10-CM

## 2016-02-24 DIAGNOSIS — I10 Essential (primary) hypertension: Secondary | ICD-10-CM

## 2016-02-24 DIAGNOSIS — Z1159 Encounter for screening for other viral diseases: Secondary | ICD-10-CM

## 2016-02-24 DIAGNOSIS — E785 Hyperlipidemia, unspecified: Secondary | ICD-10-CM | POA: Diagnosis not present

## 2016-02-24 DIAGNOSIS — Z0001 Encounter for general adult medical examination with abnormal findings: Secondary | ICD-10-CM

## 2016-02-24 MED ORDER — CARISOPRODOL 350 MG PO TABS: ORAL_TABLET | ORAL | Status: DC

## 2016-02-24 NOTE — Telephone Encounter (Signed)
Medication refill sent to pharmacy  

## 2016-02-24 NOTE — Patient Instructions (Signed)
Please continue all other medications as before, and refills have been done if requested.  Please have the pharmacy call with any other refills you may need.  Please continue your efforts at being more active, low cholesterol diet, and weight control.  You are otherwise up to date with prevention measures today.  Please keep your appointments with your specialists as you may have planned  Please return in 6 months, or sooner if needed, with Lab testing done 3-5 days before  

## 2016-02-24 NOTE — Progress Notes (Signed)
Subjective:    Patient ID: Jeffrey [name], male    DOB: 1966-06-03, 50 y.o.   MRN: [id number]  HPI  Here to f/u; overall doing ok,  Pt denies chest pain, increasing sob or doe, wheezing, orthopnea, PND, increased LE swelling, palpitations, dizziness or syncope.  Pt denies new neurological symptoms such as new headache, or facial or extremity weakness or numbness.  Pt denies polydipsia, polyuria, or low sugar episode.   Pt denies new neurological symptoms such as new headache, or facial or extremity weakness or numbness.   Pt states overall good compliance with meds, mostly trying to follow appropriate diet, with wt overall stable,  but little exercise however. 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 Also Has already seen in Mychart, vit d has been increased from 1000 to 4000 units per day Vit D.  Past Medical History  Diagnosis Date  . DIABETES MELLITUS, TYPE II 07/05/2007  . HYPERLIPIDEMIA 07/05/2007  . ANXIETY 07/05/2007  . ERECTILE DYSFUNCTION 07/11/2007  . DEPRESSION 07/05/2007  . CARPAL TUNNEL SYNDROME, BILATERAL 07/05/2007  . Meralgia paresthetica 09/25/2008  . HYPERTENSION 07/05/2007  . SINUSITIS- ACUTE-NOS 03/26/2009  . GERD 07/11/2007  . PEPTIC ULCER DISEASE 07/05/2007  . SKIN LESION 03/18/2008  . Cervicalgia 03/18/2008  . LOW BACK PAIN 07/11/2007  . Allergy   . Cancer ([place])     basal cell  . Arthritis 08/2015    neck and back   Past Surgical History  Procedure Laterality Date  . Left wrist surgury/fracture  1985  . Basal cell carcinoma excision Right 2011    shoulder area    reports that he quit smoking about 2 years ago. His smoking use included Cigars. He uses smokeless tobacco. He reports that he drinks alcohol. He reports  that he does not use illicit drugs. family history includes Cancer in his maternal grandfather; Diabetes in his father, mother, and other; Heart disease in his maternal grandmother; Hypertension in his father, mother, and other. There is no history of Colon cancer. Allergies  Allergen Reactions  . Codeine Itching   Current Outpatient Prescriptions on File Prior to Visit  Medication Sig Dispense Refill  . ALPRAZolam (XANAX) 1 MG tablet TAKE 1 TABLET BY MOUTH TWICE DAILY AS NEEDED 60 tablet 1  . aspirin 81 MG tablet Take 81 mg by mouth daily.      Marland Kitchen atorvastatin (LIPITOR) 20 MG tablet Take 1 tablet (20 mg total) by mouth daily. 90 tablet 3  . gabapentin (NEURONTIN) 300 MG capsule Take 1 capsule (300 mg total) by mouth 2 (two) times daily. 180 capsule 1  . glipiZIDE (GLIPIZIDE XL) 10 MG 24 hr tablet TAKE 1 and 1/2 TABLET (15 MG TOTAL) BY MOUTH DAILY WITH BREAKFAST. 135 tablet 3  . metFORMIN (GLUCOPHAGE) 500 MG tablet Take two tablets by mouth in the morning and take 2 tablets by mouth in the evening. 360 tablet 3  . nitroGLYCERIN (NITRODUR - DOSED IN MG/24 HR) 0.2 mg/hr patch 1/4 patch daily 30 patch 1  . pioglitazone (ACTOS) 45 MG tablet Take 1 tablet (45 mg total) by mouth daily. 90 tablet 3   No current facility-administered medications on file prior to visit.    Review of Systems  Constitutional: Negative for unusual diaphoresis or night sweats HENT: Negative for ear swelling or discharge Eyes: Negative for worsening visual haziness  Respiratory: Negative for choking and stridor.   Gastrointestinal: Negative for distension or worsening eructation Genitourinary: Negative for retention or change in urine volume.  Musculoskeletal: Negative for other MSK pain or swelling Skin: Negative for color change and worsening wound Neurological: Negative for tremors and numbness other than noted  Psychiatric/Behavioral: Negative for decreased concentration or agitation other than above         Objective:   Physical Exam BP 140/82 mmHg  Pulse 83  Temp(Src) 98.4 F (36.9 C) (Oral)  Resp 20  Wt 279 lb (126.554 kg)  SpO2 98%  VS noted,  Constitutional: Pt appears in no apparent distress HENT: Head: NCAT.  Right Ear: External ear normal.  Left Ear: External ear normal.  Eyes: . Pupils are equal, round, and reactive to light. Conjunctivae and EOM are normal Neck: Normal range of motion. Neck supple.  Cardiovascular: Normal rate and regular rhythm.   Pulmonary/Chest: Effort normal and breath sounds without rales or wheezing.  Abd:  Soft, NT, ND, + BS Neurological: Pt is alert. Not confused , motor grossly intact Skin: Skin is warm. No rash, no LE edema Psychiatric: Pt behavior is normal. No agitation.     Assessment & Plan:

## 2016-02-24 NOTE — Assessment & Plan Note (Signed)
Per Dr Tamala Julian, new result July 10, vit d has been increased to 4000 units per day, ok to cont this

## 2016-02-24 NOTE — Progress Notes (Signed)
Pre visit review using our clinic review tool, if applicable. No additional management support is needed unless otherwise documented below in the visit note. 

## 2016-03-01 ENCOUNTER — Encounter: Payer: Self-pay | Admitting: Internal Medicine

## 2016-03-01 MED ORDER — CARISOPRODOL 350 MG PO TABS: ORAL_TABLET | ORAL | Status: DC

## 2016-03-01 MED ORDER — LISINOPRIL 40 MG PO TABS: 40.0000 mg | ORAL_TABLET | Freq: Every day | ORAL | Status: DC

## 2016-03-01 NOTE — Telephone Encounter (Signed)
Lisinopril sent erx  Other Done hardcopy to [facility]

## 2016-03-02 NOTE — Assessment & Plan Note (Signed)
stable overall by history and exam, recent data reviewed with pt, and pt to continue medical treatment as before,  to f/u any worsening symptoms or concerns Lab Results  Component Value Date   HGBA1C 6.9* 02/22/2016

## 2016-03-02 NOTE — Assessment & Plan Note (Signed)
stable overall by history and exam, recent data reviewed with pt, and pt to continue medical treatment as before,  to f/u any worsening symptoms or concerns BP Readings from Last 3 Encounters:  02/24/16 140/82  01/22/16 134/80  11/30/15 118/82

## 2016-03-02 NOTE — Assessment & Plan Note (Signed)
stable overall by history and exam, recent data reviewed with pt, and pt to continue medical treatment as before,  to f/u any worsening symptoms or concerns Lab Results  Component Value Date   [place] 95 02/22/2016

## 2016-04-02 ENCOUNTER — Encounter: Payer: Self-pay | Admitting: Family Medicine

## 2016-04-11 ENCOUNTER — Telehealth: Payer: Self-pay | Admitting: *Deleted

## 2016-04-11 NOTE — Telephone Encounter (Signed)
Rec'd fax pt requesting refills on his Alprazolam 1 mg. Last filled 02/17/16...Johny Chess

## 2016-04-12 ENCOUNTER — Encounter: Payer: Self-pay | Admitting: Internal Medicine

## 2016-04-12 MED ORDER — ALPRAZOLAM 1 MG PO TABS: 1.0000 mg | ORAL_TABLET | Freq: Two times a day (BID) | ORAL | 1 refills | Status: DC | PRN

## 2016-04-12 NOTE — Telephone Encounter (Signed)
Done hardcopy to Corinne  

## 2016-04-12 NOTE — Telephone Encounter (Signed)
Faxed script back to [facility].../lmb  

## 2016-04-12 NOTE — Telephone Encounter (Signed)
Rx faxed to [facility],,,/lmb

## 2016-05-22 ENCOUNTER — Encounter: Payer: Self-pay | Admitting: Family Medicine

## 2016-05-23 MED ORDER — GABAPENTIN 300 MG PO CAPS: 300.0000 mg | ORAL_CAPSULE | Freq: Two times a day (BID) | ORAL | 1 refills | Status: DC

## 2016-05-25 ENCOUNTER — Ambulatory Visit (INDEPENDENT_AMBULATORY_CARE_PROVIDER_SITE_OTHER): Payer: Managed Care, Other (non HMO) | Admitting: Family Medicine

## 2016-05-25 ENCOUNTER — Encounter: Payer: Self-pay | Admitting: Family Medicine

## 2016-05-25 VITALS — BP 130/80 | HR 85 | Temp 98.9°F | Resp 17 | Ht 71.5 in | Wt 276.0 lb

## 2016-05-25 DIAGNOSIS — M5412 Radiculopathy, cervical region: Secondary | ICD-10-CM | POA: Diagnosis not present

## 2016-05-25 DIAGNOSIS — M542 Cervicalgia: Secondary | ICD-10-CM | POA: Diagnosis not present

## 2016-05-25 MED ORDER — PREDNISONE 20 MG PO TABS: 40.0000 mg | ORAL_TABLET | Freq: Every day | ORAL | 0 refills | Status: DC

## 2016-05-25 NOTE — Progress Notes (Signed)
Subjective:  By signing my name below, I, Moises Blood, attest that this documentation has been prepared under the direction and in the presence of Merri Ray, MD. Electronically Signed: Moises Blood, [place]. 05/25/2016 , 11:35 AM .  Patient was seen in Room 12 .   Patient ID: Jeffrey [name], male    DOB: [birth year], 50 y.o.   MRN: [id number] Chief Complaint  Patient presents with  . Neck Pain    of unknown origin    HPI Jeffrey [name] is a 50 y.o. male Here for neck pain and left shoulder pain. He has been under the care of Dr. Hulan Saas for left arm pain and left neck pain. His most recent visit with Dr. Tamala Julian was in June for left shoulder pain and cervical radiculopathy.   He was initially seen in January, and pain had been ongoing for a few months already at that time. He had cervical spine xrays at that visit indicating multiple levels of OA changes. He was started on gabapentin initially at 100mg  and then to 200mg . He was also started on meloxicam 15mg  qd. He was referred to physical therapy, and treated with prednisone for 5 days at subsequent visit. His gabapentin was increased to 300mg  at night in February. And then changed to gabapentin 100mg  in the morning, 100mg  at lunch, and 300mg  at night in March. When he was seen in April, gabapentin was increased to 300mg  bid. He had been doing well at that dose.   Patient states he's been off medications for about a week. He was doing well on gabapentin 300mg  bid. He noticed the pain again this morning - left neck into upper back.  Not traveling down arm.  He denies any new chest pain or trouble breathing. His last steroid injection was about 2~3 months ago to shoulder. Marland Kitchen He does note h/o Type-2 DM; states he's doing well and last A1c was 6.9-7.0. He takes glipizide, actos, and metformin.   Patient Active Problem List   Diagnosis Date Noted  . Vitamin D deficiency 02/24/2016  . Labral tear of long head of left biceps  tendon 01/22/2016  . Polyarthralgia 01/22/2016  . Cervical disc disorder with radiculopathy of cervical region 09/07/2015  . Shoulder bursitis 09/07/2015  . Left shoulder pain 08/18/2015  . Polycythemia, secondary 08/30/2013  . Allergic rhinitis, cause unspecified 08/30/2013  . Abnormal urine color 05/24/2013  . Right flank pain 05/24/2013  . Skin cancer, basal cell 12/27/2010  . Right shoulder pain 12/27/2010  . Right hand pain 12/27/2010  . Preventative health care 12/26/2010  . Meralgia paresthetica 09/25/2008  . Cervicalgia 03/18/2008  . ERECTILE DYSFUNCTION 07/11/2007  . GERD 07/11/2007  . LOW BACK PAIN 07/11/2007  . Diabetes ([place]) 07/05/2007  . Hyperlipidemia 07/05/2007  . ANXIETY 07/05/2007  . DEPRESSION 07/05/2007  . CARPAL TUNNEL SYNDROME, BILATERAL 07/05/2007  . Essential hypertension 07/05/2007  . PEPTIC ULCER DISEASE 07/05/2007   Past Medical History:  Diagnosis Date  . Allergy   . ANXIETY 07/05/2007  . Arthritis 08/2015   neck and back  . Cancer ([place])    basal cell  . CARPAL TUNNEL SYNDROME, BILATERAL 07/05/2007  . Cervicalgia 03/18/2008  . DEPRESSION 07/05/2007  . DIABETES MELLITUS, TYPE II 07/05/2007  . ERECTILE DYSFUNCTION 07/11/2007  . GERD 07/11/2007  . HYPERLIPIDEMIA 07/05/2007  . HYPERTENSION 07/05/2007  . LOW BACK PAIN 07/11/2007  . Meralgia paresthetica 09/25/2008  . PEPTIC ULCER DISEASE 07/05/2007  . SINUSITIS- ACUTE-NOS 03/26/2009  . SKIN LESION  03/18/2008   Past Surgical History:  Procedure Laterality Date  . BASAL CELL CARCINOMA EXCISION Right 2011   shoulder area  . left wrist surgury/fracture  1985   Allergies  Allergen Reactions  . Codeine Itching   Prior to Admission medications   Medication Sig Start Date End Date Taking? Authorizing Provider  ALPRAZolam Duanne Moron) 1 MG tablet Take 1 tablet (1 mg total) by mouth 2 (two) times daily as needed. 04/12/16  Yes Biagio Borg, MD  aspirin 81 MG tablet Take 81 mg by mouth daily.     Yes  Historical Provider, MD  atorvastatin (LIPITOR) 20 MG tablet Take 1 tablet (20 mg total) by mouth daily. 08/18/15  Yes Biagio Borg, MD  carisoprodol (SOMA) 350 MG tablet 1 tab by mouth daily as needed 03/01/16  Yes Biagio Borg, MD  gabapentin (NEURONTIN) 300 MG capsule Take 1 capsule (300 mg total) by mouth 2 (two) times daily. 05/23/16  Yes Lyndal Pulley, DO  glipiZIDE (GLIPIZIDE XL) 10 MG 24 hr tablet TAKE 1 and 1/2 TABLET (15 MG TOTAL) BY MOUTH DAILY WITH BREAKFAST. 08/18/15  Yes Biagio Borg, MD  lisinopril (PRINIVIL,ZESTRIL) 40 MG tablet Take 1 tablet (40 mg total) by mouth daily. 03/01/16  Yes Biagio Borg, MD  metFORMIN (GLUCOPHAGE) 500 MG tablet Take two tablets by mouth in the morning and take 2 tablets by mouth in the evening. 08/18/15  Yes Biagio Borg, MD  nitroGLYCERIN (NITRODUR - DOSED IN MG/24 HR) 0.2 mg/hr patch 1/4 patch daily 11/30/15  Yes Lyndal Pulley, DO  pioglitazone (ACTOS) 45 MG tablet Take 1 tablet (45 mg total) by mouth daily. 09/28/15  Yes Biagio Borg, MD   Social History   Social History  . Marital status: Married    Spouse name: N/A  . Number of children: 2  . Years of education: N/A   Occupational History  . 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 Smoking status: Former Smoker    Types: Cigars    Quit date: 02/17/2014  . Smokeless tobacco: Current User     Comment: ecigs  . Alcohol use 0.0 oz/week     Comment: social  . Drug use: No  . Sexual activity: Not on file   Other Topics Concern  . Not on file   Social History Narrative  . No narrative on file   Review of Systems  Constitutional: Negative for fatigue and unexpected weight change.  Eyes: Negative for visual disturbance.  Respiratory: Negative for cough, chest tightness and shortness of breath.   Cardiovascular: Negative for chest pain, palpitations and leg swelling.  Gastrointestinal: Negative for abdominal pain and blood in stool.  Musculoskeletal:  Positive for arthralgias, back pain, joint swelling, myalgias and neck pain. Negative for gait problem.  Skin: Negative for rash and wound.  Neurological: Negative for dizziness, weakness, light-headedness, numbness and headaches.       Objective:   Physical Exam  Constitutional: He is oriented to person, place, and time. He appears well-developed and well-nourished.  HENT:  Head: Normocephalic and atraumatic.  Eyes: EOM are normal. Pupils are equal, round, and reactive to light.  Neck: No JVD present. Carotid bruit is not present.  Cardiovascular: Normal rate, regular rhythm and normal heart sounds.   No murmur heard. Pulmonary/Chest: Effort normal and breath sounds normal. He has no rales.  Musculoskeletal: He exhibits no edema.  No erythema, no apparent soft tissue  swelling, no rash, skin intact; active ROM of C-spine: full flexion, lacking approximately 40 degrees of extension, lacking about 20 degrees with right rotation, lacking 20-30 degrees with left rotation, lateral flexion overall intact, pain was recreated in his left upper back with left rotation; tight with spasms of left upper shoulder; no midline bony tenderness, there's some tenderness extending into left trapezius and rhomboids, strength in upper extremities intact bilaterally, discomfort with Spurling's test but no radiation  Neurological: He is alert and oriented to person, place, and time.  Reflex Scores:      Tricep reflexes are 0 on the right side and 0 on the left side.      Bicep reflexes are 2+ on the right side and 2+ on the left side.      Brachioradialis reflexes are 2+ on the right side and 2+ on the left side. Skin: Skin is warm and dry.  Psychiatric: He has a normal mood and affect.  Vitals reviewed.   Vitals:   05/25/16 1033  BP: 130/80  Pulse: 85  Resp: 17  Temp: 98.9 F (37.2 C)  TempSrc: Oral  SpO2: 97%  Weight: 276 lb (125.2 kg)  Height: 5' 11.5" (1.816 m)      Assessment & Plan:     Jeffrey [name] is a 50 y.o. male Neck pain on left side  Cervical radiculopathy Appears to be a flare of his left-sided cervical radiculopathy, likely related to coming off of gabapentin for the past week. No weakness or concerning signs or symptoms on today's visit. Imaging was deferred without recent injury or change in usual symptoms.  -Restart gabapentin 300 mg twice a day today. He has a prescription waiting for him at the pharmacy already.  -Okay to take Manuela Neptune today if needed to help with breakthrough pain until gabapentin starts to work.  - Moist heat, or ice, gentle range of motion and stretches.  -if not improving into tomorrow morning, I did prescribe prednisone 40 mg daily 3 days. Side effects of this medication discussed as well as the effect on his diabetes. Close monitoring of home glucose readings recommended if he does take prednisone and RTC precautions given.   Meds ordered this encounter  Medications  . predniSONE (DELTASONE) 20 MG tablet    Sig: Take 2 tablets (40 mg total) by mouth daily with breakfast.    Dispense:  6 tablet    Refill:  0   Patient Instructions    Your pain appears to be due to similar cause as in the past (cervical radiculopathy). I suspect some of the increase in pain is from coming off of your gabapentin. Try restarting your gabapentin this morning, second dose later tonight, and can take your Manuela Neptune today if needed to see if that will calm down pain/spasm to allow gabapentin to work.   If the pain is not improving into tomorrow with restarting the gabapentin today as well as Soma today, I did print out a prescription for prednisone to be taken for 3 days in a row. With your diabetes, keep an eye on your blood sugars if you do start the prednisone. If you have readings over 250, contact your primary care provider, or return here to discuss management of this elevated sugars while you are taking prednisone.   Return to the clinic or go to the  nearest emergency room if any of your symptoms worsen or new symptoms occur.  Cervical Radiculopathy Cervical radiculopathy happens when a nerve in the neck (  cervical nerve) is pinched or bruised. This condition can develop because of an injury or as part of the normal aging process. Pressure on the cervical nerves can cause pain or numbness that runs from the neck all the way down into the arm and fingers. Usually, this condition gets better with rest. Treatment may be needed if the condition does not improve.  CAUSES This condition may be caused by:  Injury.  Slipped (herniated) disk.  Muscle tightness in the neck because of overuse.  Arthritis.  Breakdown or degeneration in the bones and joints of the spine (spondylosis) due to aging.  Bone spurs that may develop near the cervical nerves. SYMPTOMS Symptoms of this condition include:  Pain that runs from the neck to the arm and hand. The pain can be severe or irritating. It may be worse when the neck is moved.  Numbness or weakness in the affected arm and hand. DIAGNOSIS This condition may be diagnosed based on symptoms, medical history, and a physical exam. You may also have tests, including:  X-rays.  CT scan.  MRI.  Electromyogram (EMG).  Nerve conduction tests. TREATMENT In many cases, treatment is not needed for this condition. With rest, the condition usually gets better over time. If treatment is needed, options may include:  Wearing a soft neck collar for short periods of time.  Physical therapy to strengthen your neck muscles.  Medicines, such as NSAIDs, oral corticosteroids, or spinal injections.  Surgery. This may be needed if other treatments do not help. Various types of surgery may be done depending on the cause of your problems. HOME CARE INSTRUCTIONS Managing Pain  Take over-the-counter and prescription medicines only as told by your health care provider.  If directed, apply ice to the affected  area.  Put ice in a plastic bag.  Place a towel between your skin and the bag.  Leave the ice on for 20 minutes, 2-3 times per day.  If ice does not help, you can try using heat. Take a warm shower or warm bath, or use a heat pack as told by your health care provider.  Try a gentle neck and shoulder massage to help relieve symptoms. Activity  Rest as needed. Follow instructions from your health care provider about any restrictions on activities.  Do stretching and strengthening exercises as told by your health care provider or physical therapist. General Instructions  If you were given a soft collar, wear it as told by your health care provider.  Use a flat pillow when you sleep.  Keep all follow-up visits as told by your health care provider. This is important. SEEK MEDICAL CARE IF:  Your condition does not improve with treatment. SEEK IMMEDIATE MEDICAL CARE IF:  Your pain gets much worse and cannot be controlled with medicines.  You have weakness or numbness in your hand, arm, face, or leg.  You have a high fever.  You have a stiff, rigid neck.  You lose control of your bowels or your bladder (have incontinence).  You have trouble with walking, balance, or speaking.   This information is not intended to replace advice given to you by your health care provider. Make sure you discuss any questions you have with your health care provider.   Document Released: 04/26/2001 Document Revised: 04/22/2015 Document Reviewed: 09/25/2014 Elsevier Interactive Patient Education [employer].   IF you received an x-ray today, you will receive an invoice from [facility]. Please contact [facility] at [phone] with questions or  concerns regarding your invoice.   IF you received labwork today, you will receive an invoice from [employer]. Please contact [facility] at [phone] with questions or concerns regarding your invoice.    Our billing staff will not be able to assist you with questions regarding bills from these companies.  You will be contacted with the lab results as soon as they are available. The fastest way to get your results is to activate your My Chart account. Instructions are located on the last page of this paperwork. If you have not heard from [place] regarding the results in 2 weeks, please contact this office.        I personally performed the services described in this documentation, which was scribed in my presence. The recorded information has been reviewed and considered, and addended by me as needed.   Signed,   Merri Ray, MD [facility].  05/25/16 11:36 AM

## 2016-05-25 NOTE — Patient Instructions (Addendum)
Your pain appears to be due to similar cause as in the past (cervical radiculopathy). I suspect some of the increase in pain is from coming off of your gabapentin. Try restarting your gabapentin this morning, second dose later tonight, and can take your Manuela Neptune today if needed to see if that will calm down pain/spasm to allow gabapentin to work.   If the pain is not improving into tomorrow with restarting the gabapentin today as well as Soma today, I did print out a prescription for prednisone to be taken for 3 days in a row. With your diabetes, keep an eye on your blood sugars if you do start the prednisone. If you have readings over 250, contact your primary care provider, or return here to discuss management of this elevated sugars while you are taking prednisone.   Return to the clinic or go to the nearest emergency room if any of your symptoms worsen or new symptoms occur.  Cervical Radiculopathy Cervical radiculopathy happens when a nerve in the neck (cervical nerve) is pinched or bruised. This condition can develop because of an injury or as part of the normal aging process. Pressure on the cervical nerves can cause pain or numbness that runs from the neck all the way down into the arm and fingers. Usually, this condition gets better with rest. Treatment may be needed if the condition does not improve.  CAUSES This condition may be caused by:  Injury.  Slipped (herniated) disk.  Muscle tightness in the neck because of overuse.  Arthritis.  Breakdown or degeneration in the bones and joints of the spine (spondylosis) due to aging.  Bone spurs that may develop near the cervical nerves. SYMPTOMS Symptoms of this condition include:  Pain that runs from the neck to the arm and hand. The pain can be severe or irritating. It may be worse when the neck is moved.  Numbness or weakness in the affected arm and hand. DIAGNOSIS This condition may be diagnosed based on symptoms, medical history,  and a physical exam. You may also have tests, including:  X-rays.  CT scan.  MRI.  Electromyogram (EMG).  Nerve conduction tests. TREATMENT In many cases, treatment is not needed for this condition. With rest, the condition usually gets better over time. If treatment is needed, options may include:  Wearing a soft neck collar for short periods of time.  Physical therapy to strengthen your neck muscles.  Medicines, such as NSAIDs, oral corticosteroids, or spinal injections.  Surgery. This may be needed if other treatments do not help. Various types of surgery may be done depending on the cause of your problems. HOME CARE INSTRUCTIONS Managing Pain  Take over-the-counter and prescription medicines only as told by your health care provider.  If directed, apply ice to the affected area.  Put ice in a plastic bag.  Place a towel between your skin and the bag.  Leave the ice on for 20 minutes, 2-3 times per day.  If ice does not help, you can try using heat. Take a warm shower or warm bath, or use a heat pack as told by your health care provider.  Try a gentle neck and shoulder massage to help relieve symptoms. Activity  Rest as needed. Follow instructions from your health care provider about any restrictions on activities.  Do stretching and strengthening exercises as told by your health care provider or physical therapist. General Instructions  If you were given a soft collar, wear it as told by your health  care provider.  Use a flat pillow when you sleep.  Keep all follow-up visits as told by your health care provider. This is important. SEEK MEDICAL CARE IF:  Your condition does not improve with treatment. SEEK IMMEDIATE MEDICAL CARE IF:  Your pain gets much worse and cannot be controlled with medicines.  You have weakness or numbness in your hand, arm, face, or leg.  You have a high fever.  You have a stiff, rigid neck.  You lose control of your bowels or  your bladder (have incontinence).  You have trouble with walking, balance, or speaking.   This information is not intended to replace advice given to you by your health care provider. Make sure you discuss any questions you have with your health care provider.   Document Released: 04/26/2001 Document Revised: 04/22/2015 Document Reviewed: 09/25/2014 Elsevier Interactive Patient Education [employer].   IF you received an x-ray today, you will receive an invoice from [facility]. Please contact [facility] at [phone] with questions or concerns regarding your invoice.   IF you received labwork today, you will receive an invoice from [employer]. Please contact [facility] at [phone] with questions or concerns regarding your invoice.   Our billing staff will not be able to assist you with questions regarding bills from these companies.  You will be contacted with the lab results as soon as they are available. The fastest way to get your results is to activate your My Chart account. Instructions are located on the last page of this paperwork. If you have not heard from [place] regarding the results in 2 weeks, please contact this office.

## 2016-06-24 ENCOUNTER — Telehealth: Payer: Self-pay | Admitting: *Deleted

## 2016-06-24 MED ORDER — ALPRAZOLAM 1 MG PO TABS: 1.0000 mg | ORAL_TABLET | Freq: Two times a day (BID) | ORAL | 2 refills | Status: DC | PRN

## 2016-06-24 NOTE — Telephone Encounter (Signed)
Rec'd fax pt requesting refill on Alprazolam 1 mg. Last fill 05/06/16...Jeffrey [name]

## 2016-06-24 NOTE — Telephone Encounter (Signed)
Done hardcopy to Corinne  

## 2016-06-24 NOTE — Telephone Encounter (Signed)
faxed

## 2016-08-22 ENCOUNTER — Other Ambulatory Visit (INDEPENDENT_AMBULATORY_CARE_PROVIDER_SITE_OTHER): Payer: Managed Care, Other (non HMO)

## 2016-08-22 DIAGNOSIS — E119 Type 2 diabetes mellitus without complications: Secondary | ICD-10-CM

## 2016-08-22 DIAGNOSIS — Z0001 Encounter for general adult medical examination with abnormal findings: Secondary | ICD-10-CM

## 2016-08-22 DIAGNOSIS — Z1159 Encounter for screening for other viral diseases: Secondary | ICD-10-CM

## 2016-08-22 LAB — HEPATIC FUNCTION PANEL
ALK PHOS: 47 U/L (ref 39–117)
ALT: 17 U/L (ref 0–53)
AST: 12 U/L (ref 0–37)
Albumin: 4.3 g/dL (ref 3.5–5.2)
BILIRUBIN DIRECT: 0.1 mg/dL (ref 0.0–0.3)
TOTAL PROTEIN: 6.4 g/dL (ref 6.0–8.3)
Total Bilirubin: 0.5 mg/dL (ref 0.2–1.2)

## 2016-08-22 LAB — CBC WITH DIFFERENTIAL/PLATELET
Basophils Absolute: 0 10*3/uL (ref 0.0–0.1)
Basophils Relative: 0.6 % (ref 0.0–3.0)
EOS PCT: 2.1 % (ref 0.0–5.0)
Eosinophils Absolute: 0.1 10*3/uL (ref 0.0–0.7)
HCT: 49.3 % (ref 39.0–52.0)
Hemoglobin: 17.3 g/dL — ABNORMAL HIGH (ref 13.0–17.0)
LYMPHS ABS: 1.2 10*3/uL (ref 0.7–4.0)
Lymphocytes Relative: 24.9 % (ref 12.0–46.0)
MCHC: 35.1 g/dL (ref 30.0–36.0)
MCV: 90.8 fl (ref 78.0–100.0)
MONO ABS: 0.6 10*3/uL (ref 0.1–1.0)
MONOS PCT: 11.7 % (ref 3.0–12.0)
NEUTROS ABS: 3 10*3/uL (ref 1.4–7.7)
NEUTROS PCT: 60.7 % (ref 43.0–77.0)
PLATELETS: 247 10*3/uL (ref 150.0–400.0)
RBC: 5.43 Mil/uL (ref 4.22–5.81)
RDW: 13.4 % (ref 11.5–15.5)
WBC: 4.9 10*3/uL (ref 4.0–10.5)

## 2016-08-22 LAB — URINALYSIS, ROUTINE W REFLEX MICROSCOPIC
Bilirubin Urine: NEGATIVE
Hgb urine dipstick: NEGATIVE
Ketones, ur: NEGATIVE
Leukocytes, UA: NEGATIVE
Nitrite: NEGATIVE
PH: 6 (ref 5.0–8.0)
RBC / HPF: NONE SEEN (ref 0–?)
SPECIFIC GRAVITY, URINE: 1.025 (ref 1.000–1.030)
Total Protein, Urine: NEGATIVE
Urine Glucose: 250 — AB
Urobilinogen, UA: 0.2 (ref 0.0–1.0)
WBC, UA: NONE SEEN (ref 0–?)

## 2016-08-22 LAB — BASIC METABOLIC PANEL
BUN: 14 mg/dL (ref 6–23)
CALCIUM: 9.4 mg/dL (ref 8.4–10.5)
CO2: 29 meq/L (ref 19–32)
Chloride: 103 mEq/L (ref 96–112)
Creatinine, Ser: 0.9 mg/dL (ref 0.40–1.50)
GFR: 93.98 mL/min (ref >60.00)
GLUCOSE: 118 mg/dL — AB (ref 70–99)
POTASSIUM: 5 meq/L (ref 3.5–5.1)
SODIUM: 139 meq/L (ref 135–145)

## 2016-08-22 LAB — MICROALBUMIN / CREATININE URINE RATIO
CREATININE, U: 133.9 mg/dL
MICROALB/CREAT RATIO: 0.5 mg/g (ref 0.0–30.0)

## 2016-08-22 LAB — PSA: PSA: 0.52 ng/mL (ref 0.10–4.00)

## 2016-08-22 LAB — TSH: TSH: 0.9 u[IU]/mL (ref 0.35–4.50)

## 2016-08-22 LAB — HEPATITIS C ANTIBODY: HCV AB: NEGATIVE

## 2016-08-22 LAB — LIPID PANEL
CHOLESTEROL: 182 mg/dL (ref 0–200)
HDL: 42.2 mg/dL (ref >39.00)
LDL Cholesterol: 122 mg/dL — ABNORMAL HIGH (ref 0–99)
NONHDL: 139.37
Total CHOL/HDL Ratio: 4
Triglycerides: 86 mg/dL (ref 0.0–149.0)
VLDL: 17.2 mg/dL (ref 0.0–40.0)

## 2016-08-22 LAB — HEMOGLOBIN A1C: HEMOGLOBIN A1C: 6.6 % — AB (ref 4.6–6.5)

## 2016-08-23 ENCOUNTER — Encounter: Payer: Self-pay | Admitting: Internal Medicine

## 2016-08-23 ENCOUNTER — Ambulatory Visit (INDEPENDENT_AMBULATORY_CARE_PROVIDER_SITE_OTHER): Payer: Managed Care, Other (non HMO) | Admitting: Internal Medicine

## 2016-08-23 VITALS — BP 140/80 | HR 96 | Temp 98.6°F | Resp 20 | Wt 281.0 lb

## 2016-08-23 DIAGNOSIS — D751 Secondary polycythemia: Secondary | ICD-10-CM

## 2016-08-23 DIAGNOSIS — Z23 Encounter for immunization: Secondary | ICD-10-CM

## 2016-08-23 DIAGNOSIS — S60459A Superficial foreign body of unspecified finger, initial encounter: Secondary | ICD-10-CM

## 2016-08-23 DIAGNOSIS — E785 Hyperlipidemia, unspecified: Secondary | ICD-10-CM

## 2016-08-23 DIAGNOSIS — Z0001 Encounter for general adult medical examination with abnormal findings: Secondary | ICD-10-CM

## 2016-08-23 DIAGNOSIS — L989 Disorder of the skin and subcutaneous tissue, unspecified: Secondary | ICD-10-CM

## 2016-08-23 MED ORDER — ATORVASTATIN CALCIUM 40 MG PO TABS: 40.0000 mg | ORAL_TABLET | Freq: Every day | ORAL | 3 refills | Status: DC

## 2016-08-23 MED ORDER — CARISOPRODOL 350 MG PO TABS: ORAL_TABLET | ORAL | 5 refills | Status: DC

## 2016-08-23 MED ORDER — GLIPIZIDE ER 10 MG PO TB24: ORAL_TABLET | ORAL | 3 refills | Status: DC

## 2016-08-23 MED ORDER — METFORMIN HCL 500 MG PO TABS: ORAL_TABLET | ORAL | 3 refills | Status: DC

## 2016-08-23 MED ORDER — LISINOPRIL 40 MG PO TABS: 40.0000 mg | ORAL_TABLET | Freq: Every day | ORAL | 3 refills | Status: DC

## 2016-08-23 MED ORDER — GABAPENTIN 300 MG PO CAPS: 300.0000 mg | ORAL_CAPSULE | Freq: Two times a day (BID) | ORAL | 1 refills | Status: DC

## 2016-08-23 MED ORDER — ALPRAZOLAM 1 MG PO TABS: 1.0000 mg | ORAL_TABLET | Freq: Two times a day (BID) | ORAL | 2 refills | Status: DC | PRN

## 2016-08-23 MED ORDER — NITROGLYCERIN 0.2 MG/HR TD PT24: MEDICATED_PATCH | TRANSDERMAL | 1 refills | Status: DC

## 2016-08-23 MED ORDER — PIOGLITAZONE HCL 45 MG PO TABS: 45.0000 mg | ORAL_TABLET | Freq: Every day | ORAL | 3 refills | Status: DC

## 2016-08-23 NOTE — Assessment & Plan Note (Signed)
For derm referral for yearly exam with hx of basal cell ca

## 2016-08-23 NOTE — Assessment & Plan Note (Signed)
Mild uncontrolled, to increase the lipitor to 40 mg qd,cont lower chol diet

## 2016-08-23 NOTE — Patient Instructions (Addendum)
You had the pneumovasx pneumonia shot today  Please continue all other medications as before, and refills have been done if requested.  Please have the pharmacy call with any other refills you may need.  Please continue your efforts at being more active, low cholesterol diet, and weight control.  You are otherwise up to date with prevention measures today.  Please keep your appointments with your specialists as you may have planned  You will be contacted regarding the referral for: Hand Surgury, and Dermatology, and Hematology for the blood count  Please return in 6 months, or sooner if needed, with Lab testing done 3-5 days before

## 2016-08-23 NOTE — Progress Notes (Signed)
Pre visit review using our clinic review tool, if applicable. No additional management support is needed unless otherwise documented below in the visit note. 

## 2016-08-23 NOTE — Assessment & Plan Note (Signed)

## 2016-08-23 NOTE — Progress Notes (Signed)
Subjective:    Patient ID: Jeffrey [name], male    DOB: 01/21/1966, 51 y.o.   MRN: [id number]  HPI  Here for wellness and f/u;  Overall doing ok;  Pt denies Chest pain, worsening SOB, DOE, wheezing, orthopnea, PND, worsening LE edema, palpitations, dizziness or syncope.  Pt denies neurological change such as new headache, facial or extremity weakness.  Pt denies polydipsia, polyuria, or low sugar symptoms. Pt states overall good compliance with treatment and medications, good tolerability, and has been trying to follow appropriate diet.  Pt denies worsening depressive symptoms, suicidal ideation or panic. No fever, night sweats, wt loss, loss of appetite, or other constitutional symptoms.  Pt states good ability with ADL's, has low fall risk, home safety reviewed and adequate, no other significant changes in hearing or vision, and only occasionally active with exercise.  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  Has a ? Splinter or other foreign to right thumb pad x 2 mo with persistent tender, never pus but bleeds in the shower, and splinter just not working itself out.  Wt Readings from Last 3 Encounters:  08/23/16 281 lb (127.5 kg)  05/25/16 276 lb (125.2 kg)  02/24/16 279 lb (126.6 kg)  Neg eval for sleep apnea in the past, but has persistent eleveated Hgb, has not seen heme for this. Has hx of basal cell ca, asks for derm referral yearly.   Past Medical History:  Diagnosis Date  . Allergy   . ANXIETY 07/05/2007  . Arthritis 08/2015   neck and back  . Cancer ([place])    basal cell  . CARPAL TUNNEL SYNDROME, BILATERAL 07/05/2007  . Cervicalgia 03/18/2008  . DEPRESSION 07/05/2007  . DIABETES MELLITUS, TYPE II 07/05/2007  . ERECTILE DYSFUNCTION 07/11/2007  . GERD 07/11/2007  . HYPERLIPIDEMIA 07/05/2007  . HYPERTENSION 07/05/2007  . LOW BACK PAIN 07/11/2007  . Meralgia paresthetica 09/25/2008  . PEPTIC ULCER DISEASE  07/05/2007  . SINUSITIS- ACUTE-NOS 03/26/2009  . SKIN LESION 03/18/2008   Past Surgical History:  Procedure Laterality Date  . BASAL CELL CARCINOMA EXCISION Right 2011   shoulder area  . left wrist surgury/fracture  1985    reports that he quit smoking about 2 years ago. His smoking use included Cigars. He uses smokeless tobacco. He reports that he drinks alcohol. He reports that he does not use drugs. family history includes Cancer in his maternal grandfather; Diabetes in his father, mother, and other; Heart disease in his maternal grandmother; Hypertension in his father, mother, and other. Allergies  Allergen Reactions  . Codeine Itching   Current Outpatient Prescriptions on File Prior to Visit  Medication Sig Dispense Refill  . aspirin 81 MG tablet Take 81 mg by mouth daily.       No current facility-administered medications on file prior to visit.     Review of Systems Constitutional: Negative for increased diaphoresis, or other activity, appetite or siginficant weight change other than noted HENT: Negative for worsening hearing loss, ear pain, facial swelling, mouth sores and neck stiffness.   Eyes: Negative for other worsening pain, redness or visual disturbance.  Respiratory: Negative for choking or stridor Cardiovascular: Negative for other chest pain and palpitations.  Gastrointestinal: Negative for worsening diarrhea, blood in stool, or abdominal distention Genitourinary: Negative for hematuria, flank pain or change in urine volume.  Musculoskeletal: Negative for myalgias or other joint complaints.  Skin: Negative for other color change and  wound or drainage.  Neurological: Negative for syncope and numbness. other than noted Hematological: Negative for adenopathy. or other swelling Psychiatric/Behavioral: Negative for hallucinations, SI, self-injury, decreased concentration or other worsening agitation.  All other system neg per pt    Objective:   Physical Exam BP  140/80   Pulse 96   Temp 98.6 F (37 C) (Oral)   Resp 20   Wt 281 lb (127.5 kg)   SpO2 95%   BMI 38.65 kg/m  VS noted, obese Constitutional: Pt is oriented to person, place, and time. Appears well-developed and well-nourished, in no significant distress Head: Normocephalic and atraumatic  Eyes: Conjunctivae and EOM are normal. Pupils are equal, round, and reactive to light Right Ear: External ear normal.  Left Ear: External ear normal Nose: Nose normal.  Mouth/Throat: Oropharynx is clear and moist  Neck: Normal range of motion. Neck supple. No JVD present. No tracheal deviation present or significant neck LA or mass Cardiovascular: Normal rate, regular rhythm, normal heart sounds and intact distal pulses.   Pulmonary/Chest: Effort normal and breath sounds without rales or wheezing  Abdominal: Soft. Bowel sounds are normal. NT. No HSM  Musculoskeletal: Normal range of motion. Exhibits no edema Lymphadenopathy: Has no cervical adenopathy.  Neurological: Pt is alert and oriented to person, place, and time. Pt has normal reflexes. No cranial nerve deficit. Motor grossly intact Skin: Skin is warm and dry. No rash noted or new ulcers, right mid thumb pad with 8 mm area dark with soft spongy tissue under, tender but no pus.   Psychiatric:  Has normal mood and affect. Behavior is normal.  No other new exam findings       Assessment & Plan:

## 2016-08-23 NOTE — Assessment & Plan Note (Signed)
Etiology unclear, neg for sleep apnea  Previuosly, for heme referral

## 2016-08-23 NOTE — Assessment & Plan Note (Signed)
Mild, no active infection but persistent, for hand surgury referral

## 2016-08-24 ENCOUNTER — Other Ambulatory Visit: Payer: Self-pay | Admitting: *Deleted

## 2016-08-24 MED ORDER — NITROGLYCERIN 0.2 MG/HR TD PT24: MEDICATED_PATCH | TRANSDERMAL | 1 refills | Status: DC

## 2016-09-05 ENCOUNTER — Encounter: Payer: Self-pay | Admitting: Hematology

## 2016-09-05 ENCOUNTER — Telehealth: Payer: Self-pay | Admitting: Hematology

## 2016-09-05 NOTE — Telephone Encounter (Signed)
Pt confirmed appt, verified demo and insurance, mailed pt letter, and in basket appt date/time to referring provider.

## 2016-09-19 ENCOUNTER — Telehealth: Payer: Self-pay | Admitting: Hematology

## 2016-09-19 ENCOUNTER — Ambulatory Visit (HOSPITAL_BASED_OUTPATIENT_CLINIC_OR_DEPARTMENT_OTHER): Payer: Managed Care, Other (non HMO) | Admitting: Hematology

## 2016-09-19 ENCOUNTER — Encounter: Payer: Self-pay | Admitting: Hematology

## 2016-09-19 ENCOUNTER — Ambulatory Visit (HOSPITAL_BASED_OUTPATIENT_CLINIC_OR_DEPARTMENT_OTHER): Payer: Managed Care, Other (non HMO)

## 2016-09-19 VITALS — BP 130/71 | HR 83 | Temp 98.5°F | Resp 16 | Ht 71.5 in | Wt 284.8 lb

## 2016-09-19 DIAGNOSIS — D751 Secondary polycythemia: Secondary | ICD-10-CM | POA: Diagnosis not present

## 2016-09-19 LAB — CBC & DIFF AND RETIC
BASO%: 0.8 % (ref 0.0–2.0)
Basophils Absolute: 0 10*3/uL (ref 0.0–0.1)
EOS%: 2.3 % (ref 0.0–7.0)
Eosinophils Absolute: 0.1 10*3/uL (ref 0.0–0.5)
HCT: 47.8 % (ref 38.4–49.9)
HGB: 16.5 g/dL (ref 13.0–17.1)
Immature Retic Fract: 0.9 % — ABNORMAL LOW (ref 3.00–10.60)
LYMPH%: 25.4 % (ref 14.0–49.0)
MCH: 31.1 pg (ref 27.2–33.4)
MCHC: 34.5 g/dL (ref 32.0–36.0)
MCV: 90.2 fL (ref 79.3–98.0)
MONO#: 0.5 10*3/uL (ref 0.1–0.9)
MONO%: 12.6 % (ref 0.0–14.0)
NEUT%: 58.9 % (ref 39.0–75.0)
NEUTROS ABS: 2.3 10*3/uL (ref 1.5–6.5)
Platelets: 219 10*3/uL (ref 140–400)
RBC: 5.3 10*6/uL (ref 4.20–5.82)
RDW: 12.6 % (ref 11.0–14.6)
Retic %: 0.73 % — ABNORMAL LOW (ref 0.80–1.80)
Retic Ct Abs: 38.69 10*3/uL (ref 34.80–93.90)
WBC: 4 10*3/uL (ref 4.0–10.3)
lymph#: 1 10*3/uL (ref 0.9–3.3)

## 2016-09-19 LAB — COMPREHENSIVE METABOLIC PANEL
ALBUMIN: 4.1 g/dL (ref 3.5–5.0)
ALK PHOS: 52 U/L (ref 40–150)
ALT: 14 U/L (ref 0–55)
ANION GAP: 7 meq/L (ref 3–11)
AST: 13 U/L (ref 5–34)
BUN: 16.6 mg/dL (ref 7.0–26.0)
CO2: 23 meq/L (ref 22–29)
Calcium: 9.3 mg/dL (ref 8.4–10.4)
Chloride: 106 mEq/L (ref 98–109)
Creatinine: 0.9 mg/dL (ref 0.7–1.3)
GLUCOSE: 121 mg/dL (ref 70–140)
POTASSIUM: 4.3 meq/L (ref 3.5–5.1)
SODIUM: 135 meq/L — AB (ref 136–145)
Total Bilirubin: 0.56 mg/dL (ref 0.20–1.20)
Total Protein: 6.6 g/dL (ref 6.4–8.3)

## 2016-09-19 NOTE — Patient Instructions (Signed)
-  Continue ASA 81mg  po as per PCP -would recommend drinking atleast 48 oz of water daily -appropriate diet and exercise to achieve ideal body weight

## 2016-09-19 NOTE — Telephone Encounter (Signed)
Appointments scheduled per 2/5 LOS. Patient given AVS report and calendars with future scheduled appointments. °

## 2016-09-19 NOTE — Progress Notes (Signed)
[facility]    HEMATOLOGY/ONCOLOGY CONSULTATION NOTE  Date of Service: 09/19/2016  Patient Care Team: Biagio Borg, MD as PCP - General (Internal Medicine)  CHIEF COMPLAINTS/PURPOSE OF CONSULTATION:  Polycythemia  HISTORY OF PRESENTING ILLNESS:   Jeffrey [name] is a wonderful 51 y.o. male who has been referred to [place] by Dr .Cathlean Cower, MD  for evaluation and management of polycythemia.  Patient has a history of hypertension, diabetes, dyslipidemia, and tongue dysfunction, GERD, peptic ulcer disease and on recent labs with his primary care physician on 08/22/2016 was on to have a borderline elevated hemoglobin of 17.3 with a normal hematocrit of 49.3. Normal WBC count and platelets. His hemoglobin was 17.2 about 6 months ago. Patient has noted new previous history of blood clots. No chest pain or shortness of breath knowledge or bloating no focal neurological deficits.  Patient notes that he does not drinking as much water as he would like. Notes that he was a former smoker but quit in July 2015. No symptoms suggestive of overt sleep apnea.  No history of COPD. No fevers no chills, night sweats, weight loss no bone pains.  MEDICAL HISTORY:  Past Medical History:  Diagnosis Date  . Allergy   . ANXIETY 07/05/2007  . Arthritis 08/2015   neck and back  . Cancer ([place])    basal cell  . CARPAL TUNNEL SYNDROME, BILATERAL 07/05/2007  . Cervicalgia 03/18/2008  . DEPRESSION 07/05/2007  . DIABETES MELLITUS, TYPE II 07/05/2007  . ERECTILE DYSFUNCTION 07/11/2007  . GERD 07/11/2007  . HYPERLIPIDEMIA 07/05/2007  . HYPERTENSION 07/05/2007  . LOW BACK PAIN 07/11/2007  . Meralgia paresthetica 09/25/2008  . PEPTIC ULCER DISEASE 07/05/2007  . SINUSITIS- ACUTE-NOS 03/26/2009  . SKIN LESION 03/18/2008    SURGICAL HISTORY: Past Surgical History:  Procedure Laterality Date  . BASAL CELL CARCINOMA EXCISION Right 2011   shoulder area  . left wrist surgury/fracture  1985    SOCIAL HISTORY: Social  History   Social History  . Marital status: Married    Spouse name: N/A  . Number of children: 2  . Years of education: N/A   Occupational History  . 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 Smoking status: Former Smoker    Types: Cigars    Quit date: 02/17/2014  . Smokeless tobacco: Current User     Comment: ecigs  . Alcohol use 0.0 oz/week     Comment: social  . Drug use: No  . Sexual activity: Not on file   Other Topics Concern  . Not on file   Social History Narrative  . No narrative on file    FAMILY HISTORY: Family History  Problem Relation Age of Onset  . Diabetes Mother   . Hypertension Mother   . Diabetes Father   . Hypertension Father   . Heart disease Maternal Grandmother   . Cancer Maternal Grandfather     colon cancer  . Diabetes Other   . Hypertension Other   . Colon cancer Neg Hx     ALLERGIES:  is allergic to codeine.  MEDICATIONS:  Current Outpatient Prescriptions  Medication Sig Dispense Refill  . ALPRAZolam (XANAX) 1 MG tablet Take 1 tablet (1 mg total) by mouth 2 (two) times daily as needed. 60 tablet 2  . aspirin 81 MG tablet Take 81 mg by mouth daily.      Marland Kitchen atorvastatin (LIPITOR) 40 MG tablet Take 1 tablet (40 mg total) by mouth  daily. 90 tablet 3  . carisoprodol (SOMA) 350 MG tablet 1 tab by mouth daily as needed 30 tablet 5  . gabapentin (NEURONTIN) 300 MG capsule Take 1 capsule (300 mg total) by mouth 2 (two) times daily. 180 capsule 1  . glipiZIDE (GLIPIZIDE XL) 10 MG 24 hr tablet TAKE 1 and 1/2 TABLET (15 MG TOTAL) BY MOUTH DAILY WITH BREAKFAST. 135 tablet 3  . lisinopril (PRINIVIL,ZESTRIL) 40 MG tablet Take 1 tablet (40 mg total) by mouth daily. 90 tablet 3  . metFORMIN (GLUCOPHAGE) 500 MG tablet Take two tablets by mouth in the morning and take 2 tablets by mouth in the evening. 360 tablet 3  . nitroGLYCERIN (NITRODUR - DOSED IN MG/24 HR) 0.2 mg/hr patch 1/4 patch daily 30 patch 1  .  pioglitazone (ACTOS) 45 MG tablet Take 1 tablet (45 mg total) by mouth daily. 90 tablet 3   No current facility-administered medications for this visit.     REVIEW OF SYSTEMS:    10 Point review of Systems was done is negative except as noted above.  PHYSICAL EXAMINATION: ECOG PERFORMANCE STATUS: 1 - Symptomatic but completely ambulatory  . Vitals:   09/19/16 1055  BP: 130/71  Pulse: 83  Resp: 16  Temp: 98.5 F (36.9 C)   Filed Weights   09/19/16 1055  Weight: 284 lb 12.8 oz (129.2 kg)   .Body mass index is 39.17 kg/m.  GENERAL:alert, in no acute distress and comfortable SKIN: skin color, texture, turgor are normal, no rashes or significant lesions EYES: normal, conjunctiva are pink and non-injected, sclera clear OROPHARYNX:no exudate, no erythema and lips, buccal mucosa, and tongue normal  NECK: supple, no JVD, thyroid normal size, non-tender, without nodularity LYMPH:  no palpable lymphadenopathy in the cervical, axillary or inguinal LUNGS: clear to auscultation with normal respiratory effort HEART: regular rate & rhythm,  no murmurs and no lower extremity edema ABDOMEN: abdomen soft, non-tender, normoactive bowel sounds no palpable hepatosplenomegaly Musculoskeletal: no cyanosis of digits and no clubbing  PSYCH: alert & oriented x 3 with fluent speech NEURO: no focal motor/sensory deficits  LABORATORY DATA:  I have reviewed the data as listed  . CBC Latest Ref Rng & Units 09/19/2016 08/22/2016 02/22/2016  WBC 4.0 - 10.3 10e3/uL 4.0 4.9 4.9  Hemoglobin 13.0 - 17.1 g/dL 16.5 17.3(H) 17.2(H)  Hematocrit 38.4 - 49.9 % 47.8 49.3 50.3  Platelets 140 - 400 10e3/uL 219 247.0 233.0    . CMP Latest Ref Rng & Units 09/19/2016 08/22/2016 02/22/2016  Glucose 70 - 140 mg/dl 121 118(H) 110(H)  BUN 7.0 - 26.0 mg/dL 16._0 Creatinine 0.7 - 1.3 mg/dL 0.9 0.90 0.85  Sodium 136 - 145 mEq/L 135(L) 139 140  Potassium 3.5 - 5.1 mEq/L 4.3 5.0 4.3  Chloride 96 - 112 mEq/L - 103 104    CO2 22 - 29 mEq/L _1 Calcium 8.4 - 10.4 mg/dL 9.3 9.4 9.2  Total Protein 6.4 - 8.3 g/dL 6.6 6.4 6.4  Total Bilirubin 0.20 - 1.20 mg/dL 0.56 0.5 0.5  Alkaline Phos 40 - 150 U/L 52 47 44  AST 5 - 34 U/L _2 ALT 0 - 55 U/L _3 RADIOGRAPHIC STUDIES: I have personally reviewed the radiological images as listed and agreed with the findings in the report. No results found.  ASSESSMENT & PLAN:   51 year old Caucasian male with  #1 elevated hemoglobin. Patient was referred for polycythemia. His hematocrit  has been normal over the last 7 months with a borderline elevated hemoglobin level. On CBC today the patient has a normal hemoglobin of 16.5 with a normal hematocrit of 47.8. No associated leukocytosis, thrombocytosis or hepatosplenomegaly . Jak2 [id number] mutation neg. PLan -No clinical, lab evidence of polycythemia vera . -Currently the patient's polycythemia has resolved as well suggesting that this could be due to some element of hemoconcentration  -Patient was recommended including ongoing hydration and good diabetes control . -No indication for bone marrow biopsy or any additional workup of the patient's polycythemia at this time . -If increasing hemoglobin and hematocrit might need to consider pulmonary function testing and sleep study .  Continue follow-up with primary care physician for management of other comorbidities . Kindly call us if any other new questions or concerns arise .  All of the patients questions were answered with apparent satisfaction. The patient knows to call the clinic with any problems, questions or concerns.  I spent 30 minutes counseling the patient face to face. The total time spent in the appointment was 40 minutes and more than 50% was on counseling and direct patient cares.    Sullivan Lone MD [place] AAHIVMS [facility] Hematology/Oncology Physician [facility]  (Office):       [phone] (Work cell):   [phone] (Fax):           [phone]  09/19/2016 11:04 AM

## 2016-09-26 ENCOUNTER — Encounter: Payer: Self-pay | Admitting: Hematology

## 2016-11-18 LAB — HM DIABETES EYE EXAM

## 2016-12-19 ENCOUNTER — Other Ambulatory Visit: Payer: Self-pay | Admitting: Internal Medicine

## 2016-12-20 NOTE — Telephone Encounter (Signed)
Done hardcopy to Shirron  

## 2016-12-20 NOTE — Telephone Encounter (Signed)
Faxed

## 2017-01-27 ENCOUNTER — Ambulatory Visit: Payer: Self-pay

## 2017-01-27 ENCOUNTER — Encounter: Payer: Self-pay | Admitting: Family Medicine

## 2017-01-27 ENCOUNTER — Ambulatory Visit (INDEPENDENT_AMBULATORY_CARE_PROVIDER_SITE_OTHER): Payer: 59 | Admitting: Family Medicine

## 2017-01-27 VITALS — BP 118/78 | HR 88 | Ht 71.0 in | Wt 278.0 lb

## 2017-01-27 DIAGNOSIS — G8929 Other chronic pain: Secondary | ICD-10-CM | POA: Diagnosis not present

## 2017-01-27 DIAGNOSIS — S46112D Strain of muscle, fascia and tendon of long head of biceps, left arm, subsequent encounter: Secondary | ICD-10-CM | POA: Diagnosis not present

## 2017-01-27 DIAGNOSIS — M25512 Pain in left shoulder: Principal | ICD-10-CM

## 2017-01-27 MED ORDER — GABAPENTIN 300 MG PO CAPS: 300.0000 mg | ORAL_CAPSULE | Freq: Four times a day (QID) | ORAL | 1 refills | Status: DC | PRN

## 2017-01-27 NOTE — Assessment & Plan Note (Addendum)
Repeat injection given today due to worsening symptoms. Tolerated the procedure well. We discussed icing regimen and home exercises. We discussed objective is doing which ones to avoid. Discuss if pain comes back and worsening to consider an MR arthrogram. Patient would've failed all conservative therapy at that point. Started on 6 weeks of formal physical therapy.

## 2017-01-27 NOTE — Patient Instructions (Addendum)
Good to see you  Refilled the gabapentin  Ice is your friend.  Exercises 3 times a week.  If not better in 1-2 weeks send me a message and we will get MRI

## 2017-01-27 NOTE — Progress Notes (Signed)
[name] [place], Jeffrey [name] [id number] Phone: [phone] Subjective:    I'm seeing this patient by the request  of:  Biagio Borg, MD  CC: Left arm pain follow-up  Jeffrey [name]:[id number]  Jeffrey [name] is a 51 y.o. male coming in with complaint of left arm and shoulder pain. Patient was found to have cervical radiculopathy. Has responded very well to home exercises, gabapentin, and has had no side effects. We increased his gabapentin and continues to do better. Patient denies any numbness. Denies any radiation down the arm. Overall feels about 99% better and has had increasing range of motion.  Unfortunately patient is having worsening left shoulder pain. Feels like he has lost some range of motion. Increasing weakness. Waking him up at night and affecting daily activities. Does not remember any true injury recently. Just seems to be significant worsening of previous symptoms. Rates the severity of pain at the moment as 9 out of 10  Past Medical History:  Diagnosis Date  . Allergy   . ANXIETY 07/05/2007  . Arthritis 08/2015   neck and back  . Cancer ([place])    basal cell  . CARPAL TUNNEL SYNDROME, BILATERAL 07/05/2007  . Cervicalgia 03/18/2008  . DEPRESSION 07/05/2007  . DIABETES MELLITUS, TYPE II 07/05/2007  . ERECTILE DYSFUNCTION 07/11/2007  . GERD 07/11/2007  . HYPERLIPIDEMIA 07/05/2007  . HYPERTENSION 07/05/2007  . LOW BACK PAIN 07/11/2007  . Meralgia paresthetica 09/25/2008  . PEPTIC ULCER DISEASE 07/05/2007  . SINUSITIS- ACUTE-NOS 03/26/2009  . SKIN LESION 03/18/2008   Past Surgical History:  Procedure Laterality Date  . BASAL CELL CARCINOMA EXCISION Right 2011   shoulder area  . left wrist surgury/fracture  1985   Social History   Social History  . Marital status: Married    Spouse name: N/A  . Number of children: 2  . Years of education: N/A   Occupational History  . supervisor for [employer]     Social History Main Topics  . Smoking status: Former Smoker    Types: Cigars    Quit date: 02/17/2014  . Smokeless tobacco: Current User     Comment: ecigs  . Alcohol use 0.0 oz/week     Comment: social  . Drug use: No  . Sexual activity: Not Asked   Other Topics Concern  . None   Social History Narrative  . None   Allergies  Allergen Reactions  . Codeine Itching   Family History  Problem Relation Age of Onset  . Diabetes Mother   . Hypertension Mother   . Diabetes Father   . Hypertension Father   . Heart disease Maternal Grandmother   . Cancer Maternal Grandfather        colon cancer  . Diabetes Other   . Hypertension Other   . Colon cancer Neg Hx     Past medical history, social, surgical and family history all reviewed in electronic medical record.  No pertanent information unless stated regarding to the chief complaint.   Review of Systems: No headache, visual changes, nausea, vomiting, diarrhea, constipation, dizziness, abdominal pain, skin rash, fevers, chills, night sweats, weight loss, swollen lymph nodes, body aches, joint swelling, muscle aches, chest pain, shortness of breath, mood changes.    Objective  Blood pressure 118/78, pulse 88, height 5\' 11"  (1.803 m), weight 278 lb (126.1 kg).  Systems examined below as of 01/27/17 General: NAD A&O x3 mood, affect normal  HEENT: Pupils  equal, extraocular movements intact no nystagmus Respiratory: not short of breath at rest or with speaking Cardiovascular: No lower extremity edema, non tender Skin: Warm dry intact with no signs of infection or rash on extremities or on axial skeleton. Abdomen: Soft nontender, no masses Neuro: Cranial nerves  intact, neurovascularly intact in all extremities with 2+ DTRs and 2+ pulses. Lymph: No lymphadenopathy appreciated today  Gait normal with good balance and coordination.  MSK: Non tender with full range of motion and good stability and symmetric strength and tone of ,  elbows, wrist,  knee hips and ankles bilaterally.  . Shoulder: Left Inspection reveals no abnormalities, atrophy or asymmetry. Palpation is normal with no tenderness over AC joint or bicipital groove. ROM is full in all planes. 4 out of 5 strength compared to the contralateral side. Positive impingement Speeds and Yergason's tests normal. Positive O'Brien's Normal scapular function observed. No painful arc and no drop arm sign. No apprehension sign Contralateral shoulder unremarkable    Procedure: Real-time Ultrasound Guided Injection of left glenohumeral joint Device: GE Logiq E  Ultrasound guided injection is preferred based studies that show increased duration, increased effect, greater accuracy, decreased procedural pain, increased response rate with ultrasound guided versus blind injection.  Verbal informed consent obtained.  Time-out conducted.  Noted no overlying erythema, induration, or other signs of local infection.  Skin prepped in a sterile fashion.  Local anesthesia: Topical Ethyl chloride.  With sterile technique and under real time ultrasound guidance:  Joint visualized.  21g 2 inch needle inserted posterior approach. Pictures taken for needle placement. Patient did have injection of 2 cc of 0.5% Marcaine, and 1cc of Kenalog 40 mg/dL. Completed without difficulty  Pain immediately resolved suggesting accurate placement of the medication.  Advised to call if fevers/chills, erythema, induration, drainage, or persistent bleeding.  Images permanently stored and available for review in the ultrasound unit.  Impression: Technically successful ultrasound guided injection.     Impression and Recommendations:     This case required medical decision making of moderate complexity.      Note: This dictation was prepared with Dragon dictation along with smaller phrase technology. Any transcriptional errors that result from this process are unintentional.

## 2017-01-29 ENCOUNTER — Other Ambulatory Visit: Payer: Self-pay | Admitting: Internal Medicine

## 2017-02-08 ENCOUNTER — Encounter: Payer: Self-pay | Admitting: Family Medicine

## 2017-02-09 ENCOUNTER — Encounter: Payer: Self-pay | Admitting: Family Medicine

## 2017-02-10 ENCOUNTER — Encounter: Payer: Self-pay | Admitting: Family Medicine

## 2017-02-10 DIAGNOSIS — M25512 Pain in left shoulder: Principal | ICD-10-CM

## 2017-02-10 DIAGNOSIS — G8929 Other chronic pain: Secondary | ICD-10-CM

## 2017-02-13 ENCOUNTER — Other Ambulatory Visit: Payer: Self-pay | Admitting: Internal Medicine

## 2017-02-16 ENCOUNTER — Other Ambulatory Visit (INDEPENDENT_AMBULATORY_CARE_PROVIDER_SITE_OTHER): Payer: 59

## 2017-02-16 ENCOUNTER — Telehealth: Payer: Self-pay

## 2017-02-16 DIAGNOSIS — Z0000 Encounter for general adult medical examination without abnormal findings: Secondary | ICD-10-CM | POA: Diagnosis not present

## 2017-02-16 LAB — URINALYSIS, ROUTINE W REFLEX MICROSCOPIC
Bilirubin Urine: NEGATIVE
Hgb urine dipstick: NEGATIVE
KETONES UR: NEGATIVE
Leukocytes, UA: NEGATIVE
Nitrite: NEGATIVE
RBC / HPF: NONE SEEN (ref 0–?)
SPECIFIC GRAVITY, URINE: 1.025 (ref 1.000–1.030)
Total Protein, Urine: NEGATIVE
URINE GLUCOSE: NEGATIVE
UROBILINOGEN UA: 0.2 (ref 0.0–1.0)
pH: 6 (ref 5.0–8.0)

## 2017-02-16 LAB — LIPID PANEL
CHOLESTEROL: 132 mg/dL (ref 0–200)
HDL: 37.6 mg/dL — ABNORMAL LOW (ref >39.00)
LDL CALC: 79 mg/dL (ref 0–99)
NonHDL: 94.13
Total CHOL/HDL Ratio: 4
Triglycerides: 77 mg/dL (ref 0.0–149.0)
VLDL: 15.4 mg/dL (ref 0.0–40.0)

## 2017-02-16 LAB — CBC WITH DIFFERENTIAL/PLATELET
BASOS PCT: 0.6 % (ref 0.0–3.0)
Basophils Absolute: 0 10*3/uL (ref 0.0–0.1)
EOS PCT: 2.1 % (ref 0.0–5.0)
Eosinophils Absolute: 0.1 10*3/uL (ref 0.0–0.7)
HCT: 47.7 % (ref 39.0–52.0)
Hemoglobin: 16.4 g/dL (ref 13.0–17.0)
Lymphocytes Relative: 21.3 % (ref 12.0–46.0)
Lymphs Abs: 1 10*3/uL (ref 0.7–4.0)
MCHC: 34.3 g/dL (ref 30.0–36.0)
MCV: 92.1 fl (ref 78.0–100.0)
MONO ABS: 0.6 10*3/uL (ref 0.1–1.0)
Monocytes Relative: 12 % (ref 3.0–12.0)
NEUTROS ABS: 3.1 10*3/uL (ref 1.4–7.7)
NEUTROS PCT: 64 % (ref 43.0–77.0)
PLATELETS: 232 10*3/uL (ref 150.0–400.0)
RBC: 5.18 Mil/uL (ref 4.22–5.81)
RDW: 13.6 % (ref 11.5–15.5)
WBC: 4.9 10*3/uL (ref 4.0–10.5)

## 2017-02-16 LAB — TSH: TSH: 1.14 u[IU]/mL (ref 0.35–4.50)

## 2017-02-16 LAB — PSA: PSA: 0.46 ng/mL (ref 0.10–4.00)

## 2017-02-16 NOTE — Telephone Encounter (Signed)
Pt stopped by office and needed labs put in for upcoming appt

## 2017-02-20 ENCOUNTER — Encounter: Payer: Self-pay | Admitting: Family Medicine

## 2017-02-21 ENCOUNTER — Ambulatory Visit: Payer: Managed Care, Other (non HMO) | Admitting: Internal Medicine

## 2017-02-21 ENCOUNTER — Other Ambulatory Visit: Payer: Self-pay

## 2017-02-21 DIAGNOSIS — M25512 Pain in left shoulder: Secondary | ICD-10-CM

## 2017-02-23 ENCOUNTER — Encounter: Payer: Self-pay | Admitting: Internal Medicine

## 2017-02-23 ENCOUNTER — Ambulatory Visit (INDEPENDENT_AMBULATORY_CARE_PROVIDER_SITE_OTHER): Payer: 59 | Admitting: Internal Medicine

## 2017-02-23 VITALS — BP 142/82 | HR 89 | Ht 71.0 in | Wt 270.0 lb

## 2017-02-23 DIAGNOSIS — E119 Type 2 diabetes mellitus without complications: Secondary | ICD-10-CM | POA: Diagnosis not present

## 2017-02-23 DIAGNOSIS — I10 Essential (primary) hypertension: Secondary | ICD-10-CM

## 2017-02-23 DIAGNOSIS — Z114 Encounter for screening for human immunodeficiency virus [HIV]: Secondary | ICD-10-CM

## 2017-02-23 DIAGNOSIS — Z0000 Encounter for general adult medical examination without abnormal findings: Secondary | ICD-10-CM | POA: Diagnosis not present

## 2017-02-23 LAB — POCT GLYCOSYLATED HEMOGLOBIN (HGB A1C): Hemoglobin A1C: 5.9

## 2017-02-23 MED ORDER — ALPRAZOLAM 1 MG PO TABS: 1.0000 mg | ORAL_TABLET | Freq: Two times a day (BID) | ORAL | 5 refills | Status: DC | PRN

## 2017-02-23 MED ORDER — CARISOPRODOL 350 MG PO TABS: ORAL_TABLET | ORAL | 5 refills | Status: DC

## 2017-02-23 NOTE — Assessment & Plan Note (Signed)
Mild elev but missed am med, usually < 140/90 at home, o/w stable overall by history and exam, recent data reviewed with pt, and pt to continue medical treatment as before,  to f/u any worsening symptoms or concerns BP Readings from Last 3 Encounters:  02/23/17 (!) 142/82  01/27/17 118/78  09/19/16 130/71

## 2017-02-23 NOTE — Assessment & Plan Note (Signed)
stable overall by history and exam, recent data reviewed with pt, and pt to continue medical treatment as before,  to f/u any worsening symptoms or concerns Lab Results  Component Value Date   HGBA1C 6.6 (H) 08/22/2016

## 2017-02-23 NOTE — Assessment & Plan Note (Signed)

## 2017-02-23 NOTE — Patient Instructions (Addendum)
Your A1c was Excellent today at : 5.9  Please continue all other medications as before, and refills have been done if requested.  Please have the pharmacy call with any other refills you may need.  Please continue your efforts at being more active, low cholesterol diet, and weight control - Keep up with the Weight loss!  You are otherwise up to date with prevention measures today.  Please keep your appointments with your specialists as you may have planned  Please return in 6 months, or sooner if needed, with Lab testing done 3-5 days before

## 2017-02-23 NOTE — Progress Notes (Addendum)
Subjective:    Patient ID: Jeffrey [name], male    DOB: [birth year], 51 y.o.   MRN: [id number]  HPI  Here for wellness and f/u;  Overall doing ok;  Pt denies Chest pain, worsening SOB, DOE, wheezing, orthopnea, PND, worsening LE edema, palpitations, dizziness or syncope.  Pt denies neurological change such as new headache, facial or extremity weakness.  Pt denies polydipsia, polyuria, or low sugar symptoms. Pt states overall good compliance with treatment and medications, good tolerability, and has been trying to follow appropriate diet.  Pt denies worsening depressive symptoms, suicidal ideation or panic. No fever, night sweats, wt loss, loss of appetite, or other constitutional symptoms.  Pt states good ability with ADL's, has low fall risk, home safety reviewed and adequate, no other significant changes in hearing or vision, and only occasionally active with exercise. Has been to lose signifiant wt with better diet, no fast food. Wt Readings from Last 3 Encounters:  02/23/17 270 lb (122.5 kg)  01/27/17 278 lb (126.1 kg)  09/19/16 284 lb 12.8 oz (129.2 kg)  Has MRi sched for july 27 for left shoulder ? Rot cuff, but also with neck pain, lbp, and now more recently left knee pain. Has known DJD neck and lower back after plain films. Left knee pain "weakness" and pain anteromedial, worse to sit too long, better to to "work it out" with walking.  No giveaways, no falls but can feel somewhat unsteady with stairs.   No new hx Past Medical History:  Diagnosis Date  . Allergy   . ANXIETY 07/05/2007  . Arthritis 08/2015   neck and back  . Cancer ([place])    basal cell  . CARPAL TUNNEL SYNDROME, BILATERAL 07/05/2007  . Cervicalgia 03/18/2008  . DEPRESSION 07/05/2007  . DIABETES MELLITUS, TYPE II 07/05/2007  . ERECTILE DYSFUNCTION 07/11/2007  . GERD 07/11/2007  . HYPERLIPIDEMIA 07/05/2007  . HYPERTENSION 07/05/2007  . LOW BACK PAIN 07/11/2007  . Meralgia paresthetica 09/25/2008  . PEPTIC ULCER  DISEASE 07/05/2007  . SINUSITIS- ACUTE-NOS 03/26/2009  . SKIN LESION 03/18/2008   Past Surgical History:  Procedure Laterality Date  . BASAL CELL CARCINOMA EXCISION Right 2011   shoulder area  . left wrist surgury/fracture  1985    reports that he quit smoking about 3 years ago. His smoking use included Cigars. He uses smokeless tobacco. He reports that he drinks alcohol. He reports that he does not use drugs. family history includes Cancer in his maternal grandfather; Diabetes in his father, mother, and other; Heart disease in his maternal grandmother; Hypertension in his father, mother, and other. Allergies  Allergen Reactions  . Codeine Itching   Current Outpatient Prescriptions on File Prior to Visit  Medication Sig Dispense Refill  . aspirin 81 MG tablet Take 81 mg by mouth daily.      Marland Kitchen atorvastatin (LIPITOR) 40 MG tablet Take 1 tablet (40 mg total) by mouth daily. 90 tablet 3  . gabapentin (NEURONTIN) 300 MG capsule Take 1 capsule (300 mg total) by mouth every 6 (six) hours as needed. 360 capsule 1  . glipiZIDE (GLIPIZIDE XL) 10 MG 24 hr tablet TAKE 1 and 1/2 TABLET (15 MG TOTAL) BY MOUTH DAILY WITH BREAKFAST. 135 tablet 3  . lisinopril (PRINIVIL,ZESTRIL) 40 MG tablet Take 1 tablet (40 mg total) by mouth daily. 90 tablet 3  . metFORMIN (GLUCOPHAGE) 500 MG tablet Take two tablets by mouth in the morning and take 2 tablets by mouth in the evening. 360 tablet  3  . nitroGLYCERIN (NITRODUR - DOSED IN MG/24 HR) 0.2 mg/hr patch APPLY 1/4 PATCH TO SKIN DAILY 30 patch 0  . pioglitazone (ACTOS) 45 MG tablet Take 1 tablet (45 mg total) by mouth daily. 90 tablet 3   No current facility-administered medications on file prior to visit.    Review of Systems Constitutional: Negative for other unusual diaphoresis, sweats, appetite or weight changes HENT: Negative for other worsening hearing loss, ear pain, facial swelling, mouth sores or neck stiffness.   Eyes: Negative for other worsening pain,  redness or other visual disturbance.  Respiratory: Negative for other stridor or swelling Cardiovascular: Negative for other palpitations or other chest pain  Gastrointestinal: Negative for worsening diarrhea or loose stools, blood in stool, distention or other pain Genitourinary: Negative for hematuria, flank pain or other change in urine volume.  Musculoskeletal: Negative for myalgias or other joint swelling.  Skin: Negative for other color change, or other wound or worsening drainage.  Neurological: Negative for other syncope or numbness. Hematological: Negative for other adenopathy or swelling Psychiatric/Behavioral: Negative for hallucinations, other worsening agitation, SI, self-injury, or new decreased concentration All other system neg per pt    Objective:   Physical Exam BP (!) 142/82   Pulse 89   Ht 5\' 11"  (1.803 m)   Wt 270 lb (122.5 kg)   SpO2 99%   BMI 37.66 kg/m  VS noted,  Constitutional: Pt is oriented to person, place, and time. Appears well-developed and well-nourished, in no significant distress and comfortable Head: Normocephalic and atraumatic  Eyes: Conjunctivae and EOM are normal. Pupils are equal, round, and reactive to light Right Ear: External ear normal without discharge Left Ear: External ear normal without discharge Nose: Nose without discharge or deformity Mouth/Throat: Oropharynx is without other ulcerations and moist  Neck: Normal range of motion. Neck supple. No JVD present. No tracheal deviation present or significant neck LA or mass Cardiovascular: Normal rate, regular rhythm, normal heart sounds and intact distal pulses.   Pulmonary/Chest: WOB normal and breath sounds without rales or wheezing  Abdominal: Soft. Bowel sounds are normal. NT. No HSM  Musculoskeletal: Normal range of motion except for reduced ROM without swelling or tender to left knee. Exhibits no edema Lymphadenopathy: Has no other cervical adenopathy.  Neurological: Pt is alert  and oriented to person, place, and time. Pt has normal reflexes. No cranial nerve deficit. Motor grossly intact, Gait intact Skin: Skin is warm and dry. No rash noted or new ulcerations Psychiatric:  Has normal mood and affect. Behavior is normal without agitation No other exam findings  Lab Results  Component Value Date   WBC 4.9 02/16/2017   HGB 16.4 02/16/2017   HCT 47.7 02/16/2017   PLT 232.0 02/16/2017   GLUCOSE 121 09/19/2016   CHOL 132 02/16/2017   TRIG 77.0 02/16/2017   HDL 37.60 (L) 02/16/2017   LDLDIRECT 90.0 08/18/2015   LDLCALC 79 02/16/2017   ALT 14 09/19/2016   AST 13 09/19/2016   NA 135 (L) 09/19/2016   K 4.3 09/19/2016   CL 103 08/22/2016   CREATININE 0.9 09/19/2016   BUN 16.6 09/19/2016   CO2 23 09/19/2016   TSH 1.14 02/16/2017   PSA 0.46 02/16/2017   HGBA1C 6.6 (H) 08/22/2016   MICROALBUR <0.7 08/22/2016   POCT - a1c - 5.9     Assessment & Plan:

## 2017-03-10 ENCOUNTER — Ambulatory Visit
Admission: RE | Admit: 2017-03-10 | Discharge: 2017-03-10 | Disposition: A | Discharge status: Home / Self Care | Payer: 59 | Source: Ambulatory Visit | Attending: Family Medicine | Admitting: Family Medicine

## 2017-03-10 DIAGNOSIS — G8929 Other chronic pain: Secondary | ICD-10-CM

## 2017-03-10 DIAGNOSIS — M25512 Pain in left shoulder: Principal | ICD-10-CM

## 2017-03-10 MED ORDER — IOPAMIDOL (ISOVUE-M 200) INJECTION 41%: 13.0000 mL | Freq: Once | INTRAMUSCULAR | Status: DC

## 2017-03-11 ENCOUNTER — Encounter: Payer: Self-pay | Admitting: Family Medicine

## 2017-03-13 ENCOUNTER — Encounter: Payer: Self-pay | Admitting: Family Medicine

## 2017-03-13 DIAGNOSIS — S46112D Strain of muscle, fascia and tendon of long head of biceps, left arm, subsequent encounter: Secondary | ICD-10-CM

## 2017-03-27 ENCOUNTER — Other Ambulatory Visit: Payer: Self-pay | Admitting: Orthopedic Surgery

## 2017-03-28 NOTE — Progress Notes (Signed)
Denies chest pain or shortness of breath. No cardiologist.

## 2017-03-30 ENCOUNTER — Ambulatory Visit (HOSPITAL_COMMUNITY)
Admission: RE | Admit: 2017-03-30 | Discharge: 2017-03-30 | Disposition: A | Discharge status: Home / Self Care | Payer: 59 | Source: Ambulatory Visit | Attending: Orthopedic Surgery | Admitting: Orthopedic Surgery

## 2017-03-30 ENCOUNTER — Encounter (HOSPITAL_COMMUNITY)
Admission: RE | Disposition: A | Discharge status: Home / Self Care | Payer: Self-pay | Source: Ambulatory Visit | Attending: Orthopedic Surgery

## 2017-03-30 ENCOUNTER — Ambulatory Visit (HOSPITAL_COMMUNITY): Payer: 59 | Admitting: Anesthesiology

## 2017-03-30 ENCOUNTER — Encounter (HOSPITAL_COMMUNITY): Payer: Self-pay | Admitting: Certified Registered Nurse Anesthetist

## 2017-03-30 DIAGNOSIS — E785 Hyperlipidemia, unspecified: Secondary | ICD-10-CM | POA: Insufficient documentation

## 2017-03-30 DIAGNOSIS — F419 Anxiety disorder, unspecified: Secondary | ICD-10-CM | POA: Insufficient documentation

## 2017-03-30 DIAGNOSIS — I10 Essential (primary) hypertension: Secondary | ICD-10-CM | POA: Diagnosis not present

## 2017-03-30 DIAGNOSIS — Z9889 Other specified postprocedural states: Secondary | ICD-10-CM | POA: Diagnosis not present

## 2017-03-30 DIAGNOSIS — X58XXXA Exposure to other specified factors, initial encounter: Secondary | ICD-10-CM | POA: Diagnosis not present

## 2017-03-30 DIAGNOSIS — Z800 Family history of malignant neoplasm of digestive organs: Secondary | ICD-10-CM | POA: Diagnosis not present

## 2017-03-30 DIAGNOSIS — E119 Type 2 diabetes mellitus without complications: Secondary | ICD-10-CM | POA: Diagnosis not present

## 2017-03-30 DIAGNOSIS — Z8711 Personal history of peptic ulcer disease: Secondary | ICD-10-CM | POA: Diagnosis not present

## 2017-03-30 DIAGNOSIS — Z7982 Long term (current) use of aspirin: Secondary | ICD-10-CM | POA: Insufficient documentation

## 2017-03-30 DIAGNOSIS — G5603 Carpal tunnel syndrome, bilateral upper limbs: Secondary | ICD-10-CM | POA: Insufficient documentation

## 2017-03-30 DIAGNOSIS — Z833 Family history of diabetes mellitus: Secondary | ICD-10-CM | POA: Diagnosis not present

## 2017-03-30 DIAGNOSIS — Z7984 Long term (current) use of oral hypoglycemic drugs: Secondary | ICD-10-CM | POA: Insufficient documentation

## 2017-03-30 DIAGNOSIS — Z885 Allergy status to narcotic agent status: Secondary | ICD-10-CM | POA: Diagnosis not present

## 2017-03-30 DIAGNOSIS — M7522 Bicipital tendinitis, left shoulder: Secondary | ICD-10-CM | POA: Insufficient documentation

## 2017-03-30 DIAGNOSIS — S43432A Superior glenoid labrum lesion of left shoulder, initial encounter: Secondary | ICD-10-CM | POA: Insufficient documentation

## 2017-03-30 DIAGNOSIS — M25512 Pain in left shoulder: Secondary | ICD-10-CM | POA: Insufficient documentation

## 2017-03-30 DIAGNOSIS — F329 Major depressive disorder, single episode, unspecified: Secondary | ICD-10-CM | POA: Diagnosis not present

## 2017-03-30 DIAGNOSIS — Z85828 Personal history of other malignant neoplasm of skin: Secondary | ICD-10-CM | POA: Diagnosis not present

## 2017-03-30 DIAGNOSIS — Z8249 Family history of ischemic heart disease and other diseases of the circulatory system: Secondary | ICD-10-CM | POA: Diagnosis not present

## 2017-03-30 DIAGNOSIS — Z720 Tobacco use: Secondary | ICD-10-CM | POA: Diagnosis not present

## 2017-03-30 HISTORY — PX: SHOULDER ARTHROSCOPY WITH SUBACROMIAL DECOMPRESSION: SHX5684

## 2017-03-30 LAB — BASIC METABOLIC PANEL
ANION GAP: 8 (ref 5–15)
BUN: 12 mg/dL (ref 6–20)
CALCIUM: 8.9 mg/dL (ref 8.9–10.3)
CO2: 24 mmol/L (ref 22–32)
Chloride: 106 mmol/L (ref 101–111)
Creatinine, Ser: 0.82 mg/dL (ref 0.61–1.24)
GFR calc Af Amer: >60 mL/min (ref >60)
GLUCOSE: 155 mg/dL — AB (ref 65–99)
POTASSIUM: 4 mmol/L (ref 3.5–5.1)
SODIUM: 138 mmol/L (ref 135–145)

## 2017-03-30 LAB — CBC
HEMATOCRIT: 45 % (ref 39.0–52.0)
HEMOGLOBIN: 15.4 g/dL (ref 13.0–17.0)
MCH: 30.9 pg (ref 26.0–34.0)
MCHC: 34.2 g/dL (ref 30.0–36.0)
MCV: 90.2 fL (ref 78.0–100.0)
Platelets: 209 10*3/uL (ref 150–400)
RBC: 4.99 MIL/uL (ref 4.22–5.81)
RDW: 13.1 % (ref 11.5–15.5)
WBC: 5 10*3/uL (ref 4.0–10.5)

## 2017-03-30 LAB — GLUCOSE, CAPILLARY
Glucose-Capillary: 157 mg/dL — ABNORMAL HIGH (ref 65–99)
Glucose-Capillary: 129 mg/dL — ABNORMAL HIGH (ref 65–99)

## 2017-03-30 SURGERY — SHOULDER ARTHROSCOPY WITH SUBACROMIAL DECOMPRESSION
Anesthesia: Regional | Laterality: Left

## 2017-03-30 MED ORDER — FENTANYL CITRATE (PF) 100 MCG/2ML IJ SOLN: 25.0000 ug | INTRAMUSCULAR | Status: DC | PRN

## 2017-03-30 MED ORDER — ROPIVACAINE HCL 7.5 MG/ML IJ SOLN
INTRAMUSCULAR | Status: DC | PRN
  Administered 2017-03-30: 20 mL via PERINEURAL

## 2017-03-30 MED ORDER — STERILE WATER FOR IRRIGATION IR SOLN
Status: DC | PRN
  Administered 2017-03-30: 1000 mL

## 2017-03-30 MED ORDER — SODIUM CHLORIDE 0.9 % IR SOLN
Status: DC | PRN
  Administered 2017-03-30 (×2): 3000 mL

## 2017-03-30 MED ORDER — PHENYLEPHRINE HCL 10 MG/ML IJ SOLN
INTRAMUSCULAR | Status: DC | PRN
  Administered 2017-03-30: 80 ug via INTRAVENOUS

## 2017-03-30 MED ORDER — LIDOCAINE HCL (CARDIAC) 20 MG/ML IV SOLN
INTRAVENOUS | Status: DC | PRN
  Administered 2017-03-30: 100 mg via INTRATRACHEAL

## 2017-03-30 MED ORDER — OXYCODONE-ACETAMINOPHEN 5-325 MG PO TABS: 1.0000 | ORAL_TABLET | ORAL | 0 refills | Status: DC | PRN

## 2017-03-30 MED ORDER — POVIDONE-IODINE 7.5 % EX SOLN: Freq: Once | CUTANEOUS | Status: DC

## 2017-03-30 MED ORDER — MIDAZOLAM HCL 2 MG/2ML IJ SOLN
INTRAMUSCULAR | Status: AC
  Administered 2017-03-30: 2 mg
  Filled 2017-03-30: qty 2

## 2017-03-30 MED ORDER — SUCCINYLCHOLINE CHLORIDE 20 MG/ML IJ SOLN
INTRAMUSCULAR | Status: DC | PRN
  Administered 2017-03-30: 140 mg via INTRAVENOUS

## 2017-03-30 MED ORDER — PROPOFOL 10 MG/ML IV BOLUS
INTRAVENOUS | Status: AC
  Filled 2017-03-30: qty 20

## 2017-03-30 MED ORDER — FENTANYL CITRATE (PF) 250 MCG/5ML IJ SOLN
INTRAMUSCULAR | Status: AC
  Filled 2017-03-30: qty 5

## 2017-03-30 MED ORDER — LACTATED RINGERS IV SOLN
INTRAVENOUS | Status: DC
  Administered 2017-03-30 (×2): via INTRAVENOUS

## 2017-03-30 MED ORDER — ONDANSETRON HCL 4 MG/2ML IJ SOLN
INTRAMUSCULAR | Status: DC | PRN
  Administered 2017-03-30: 4 mg via INTRAVENOUS

## 2017-03-30 MED ORDER — 0.9 % SODIUM CHLORIDE (POUR BTL) OPTIME
TOPICAL | Status: DC | PRN
  Administered 2017-03-30: 1000 mL

## 2017-03-30 MED ORDER — FENTANYL CITRATE (PF) 250 MCG/5ML IJ SOLN
INTRAMUSCULAR | Status: DC | PRN
  Administered 2017-03-30: 100 ug via INTRAVENOUS

## 2017-03-30 MED ORDER — FENTANYL CITRATE (PF) 100 MCG/2ML IJ SOLN
INTRAMUSCULAR | Status: AC
  Filled 2017-03-30: qty 2

## 2017-03-30 MED ORDER — PROPOFOL 10 MG/ML IV BOLUS
INTRAVENOUS | Status: DC | PRN
  Administered 2017-03-30: 200 mg via INTRAVENOUS

## 2017-03-30 MED ORDER — DEXTROSE 5 % IV SOLN
3.0000 g | INTRAVENOUS | Status: AC
  Administered 2017-03-30: 3 g via INTRAVENOUS
  Filled 2017-03-30: qty 3000

## 2017-03-30 MED ORDER — ONDANSETRON HCL 4 MG/2ML IJ SOLN
INTRAMUSCULAR | Status: AC
  Filled 2017-03-30: qty 2

## 2017-03-30 MED ORDER — ONDANSETRON HCL 4 MG/2ML IJ SOLN: 4.0000 mg | Freq: Once | INTRAMUSCULAR | Status: DC | PRN

## 2017-03-30 MED ORDER — PHENYLEPHRINE 40 MCG/ML (10ML) SYRINGE FOR IV PUSH (FOR BLOOD PRESSURE SUPPORT)
PREFILLED_SYRINGE | INTRAVENOUS | Status: AC
  Filled 2017-03-30: qty 10

## 2017-03-30 MED ORDER — CEFAZOLIN SODIUM-DEXTROSE 2-4 GM/100ML-% IV SOLN
INTRAVENOUS | Status: AC
  Filled 2017-03-30: qty 100

## 2017-03-30 SURGICAL SUPPLY — 67 items
ADH SKN CLS APL DERMABOND .7 (GAUZE/BANDAGES/DRESSINGS) ×1
BLADE SURG 11 STRL SS (BLADE) ×3 IMPLANT
BUR OVAL 4.0 (BURR) ×3 IMPLANT
CANNULA 5.75X71 LONG (CANNULA) ×3 IMPLANT
CANNULA TWIST IN 8.25X7CM (CANNULA) IMPLANT
CHLORAPREP W/TINT 26ML (MISCELLANEOUS) ×3 IMPLANT
DERMABOND ADVANCED (GAUZE/BANDAGES/DRESSINGS) ×2
DERMABOND ADVANCED .7 DNX12 (GAUZE/BANDAGES/DRESSINGS) IMPLANT
DRAPE INCISE IOBAN 66X45 STRL (DRAPES) ×3 IMPLANT
DRAPE ORTHO SPLIT 77X108 STRL (DRAPES) ×6
DRAPE STERI 35X30 U-POUCH (DRAPES) ×3 IMPLANT
DRAPE SURG 17X23 STRL (DRAPES) ×3 IMPLANT
DRAPE SURG ORHT 6 SPLT 77X108 (DRAPES) ×2 IMPLANT
DRAPE U-SHAPE 47X51 STRL (DRAPES) ×3 IMPLANT
DRAPE U-SHAPE 76X120 STRL (DRAPES) ×3 IMPLANT
DRSG PAD ABDOMINAL 8X10 ST (GAUZE/BANDAGES/DRESSINGS) ×6 IMPLANT
ELECT BLADE 4.0 EZ CLEAN MEGAD (MISCELLANEOUS) ×3
ELECTRODE BLDE 4.0 EZ CLN MEGD (MISCELLANEOUS) IMPLANT
GAUZE SPONGE 4X4 12PLY STRL (GAUZE/BANDAGES/DRESSINGS) ×3 IMPLANT
GAUZE XEROFORM 1X8 LF (GAUZE/BANDAGES/DRESSINGS) ×3 IMPLANT
GLOVE BIO SURGEON STRL SZ7 (GLOVE) ×6 IMPLANT
GLOVE BIO SURGEON STRL SZ7.5 (GLOVE) ×3 IMPLANT
GLOVE BIOGEL PI IND STRL 7.0 (GLOVE) ×2 IMPLANT
GLOVE BIOGEL PI IND STRL 8 (GLOVE) ×1 IMPLANT
GLOVE BIOGEL PI INDICATOR 7.0 (GLOVE) ×4
GLOVE BIOGEL PI INDICATOR 8 (GLOVE) ×2
GOWN STRL REUS W/ TWL LRG LVL3 (GOWN DISPOSABLE) ×3 IMPLANT
GOWN STRL REUS W/ TWL XL LVL3 (GOWN DISPOSABLE) ×1 IMPLANT
GOWN STRL REUS W/TWL LRG LVL3 (GOWN DISPOSABLE) ×9
GOWN STRL REUS W/TWL XL LVL3 (GOWN DISPOSABLE) ×3
KIT BASIN OR (CUSTOM PROCEDURE TRAY) ×3 IMPLANT
KIT ROOM TURNOVER OR (KITS) ×3 IMPLANT
LASSO CRESCENT QUICKPASS (SUTURE) ×2 IMPLANT
MANIFOLD NEPTUNE II (INSTRUMENTS) ×3 IMPLANT
NDL HYPO 25GX1X1/2 BEV (NEEDLE) IMPLANT
NDL SCORPION MULTI FIRE (NEEDLE) IMPLANT
NDL SPNL 18GX3.5 QUINCKE PK (NEEDLE) ×1 IMPLANT
NDL SUT 6 .5 CRC .975X.05 MAYO (NEEDLE) IMPLANT
NEEDLE HYPO 25GX1X1/2 BEV (NEEDLE) ×3 IMPLANT
NEEDLE MAYO TAPER (NEEDLE)
NEEDLE SCORPION MULTI FIRE (NEEDLE) IMPLANT
NEEDLE SPNL 18GX3.5 QUINCKE PK (NEEDLE) ×3 IMPLANT
PACK SHOULDER (CUSTOM PROCEDURE TRAY) ×3 IMPLANT
PAD ARMBOARD 7.5X6 YLW CONV (MISCELLANEOUS) ×6 IMPLANT
PROBE BIPOLAR ATHRO 135MM 90D (MISCELLANEOUS) ×2 IMPLANT
RESECTOR FULL RADIUS 4.2MM (BLADE) ×3 IMPLANT
SLING ARM FOAM STRAP LRG (SOFTGOODS) ×3 IMPLANT
SLING ARM FOAM STRAP MED (SOFTGOODS) IMPLANT
SPONGE LAP 4X18 X RAY DECT (DISPOSABLE) IMPLANT
SUPPORT WRAP ARM LG (MISCELLANEOUS) ×5 IMPLANT
SUT 2 FIBERLOOP 20 STRT BLUE (SUTURE) ×3
SUT ETHILON 3 0 PS 1 (SUTURE) ×3 IMPLANT
SUT TIGER TAPE 7 IN WHITE (SUTURE) IMPLANT
SUT VIC AB 3-0 FS2 27 (SUTURE) ×2 IMPLANT
SUTURE 2 FIBERLOOP 20 STRT BLU (SUTURE) IMPLANT
SUTURE TAPE 1.3 40 TPR END (SUTURE) IMPLANT
SUTURETAPE 1.3 40 TPR END (SUTURE)
SYR CONTROL 10ML LL (SYRINGE) ×3 IMPLANT
TAPE FIBER 2MM 7IN #2 BLUE (SUTURE) IMPLANT
TOWEL OR 17X24 6PK STRL BLUE (TOWEL DISPOSABLE) ×3 IMPLANT
TOWEL OR 17X26 10 PK STRL BLUE (TOWEL DISPOSABLE) ×3 IMPLANT
TOWEL OR NON WOVEN STRL DISP B (DISPOSABLE) ×3 IMPLANT
TUBE CONNECTING 12'X1/4 (SUCTIONS) ×1
TUBE CONNECTING 12X1/4 (SUCTIONS) ×2 IMPLANT
TUBING ARTHROSCOPY IRRIG 16FT (MISCELLANEOUS) ×3 IMPLANT
WAND HAND CNTRL MULTIVAC 90 (MISCELLANEOUS) ×3 IMPLANT
WATER STERILE IRR 1000ML POUR (IV SOLUTION) ×3 IMPLANT

## 2017-03-30 NOTE — Anesthesia Preprocedure Evaluation (Addendum)
Anesthesia Evaluation  Patient identified by MRN, date of birth, ID band Patient awake    Reviewed: Allergy & Precautions, NPO status , Patient's Chart, lab work & pertinent test results  Airway Mallampati: III  TM Distance: >3 FB Neck ROM: Full   Comment: Full beard Dental no notable dental hx.    Pulmonary former smoker,    Pulmonary exam normal breath sounds clear to auscultation       Cardiovascular hypertension, Pt. on medications Normal cardiovascular exam Rhythm:Regular Rate:Normal  ECG: SR, rate 82   Neuro/Psych PSYCHIATRIC DISORDERS Anxiety Depression Meralgia paresthetica    GI/Hepatic Neg liver ROS, PUD, GERD  ,  Endo/Other  diabetes, Oral Hypoglycemic Agents  Renal/GU negative Renal ROS     Musculoskeletal negative musculoskeletal ROS (+)   Abdominal   Peds  Hematology negative hematology ROS (+)   Anesthesia Other Findings HYPERLIPIDEMIA  Reproductive/Obstetrics                            Anesthesia Physical Anesthesia Plan  ASA: III  Anesthesia Plan: General and Regional   Post-op Pain Management: GA combined w/ Regional for post-op pain   Induction: Intravenous  PONV Risk Score and Plan: 2 and Ondansetron, Dexamethasone, Treatment may vary due to age or medical condition and Midazolam  Airway Management Planned: Oral ETT  Additional Equipment:   Intra-op Plan:   Post-operative Plan: Extubation in OR  Informed Consent: I have reviewed the patients History and Physical, chart, labs and discussed the procedure including the risks, benefits and alternatives for the proposed anesthesia with the patient or authorized representative who has indicated his/her understanding and acceptance.   Dental advisory given  Plan Discussed with: CRNA  Anesthesia Plan Comments:        Anesthesia Quick Evaluation

## 2017-03-30 NOTE — Anesthesia Procedure Notes (Signed)
Anesthesia Regional Block: Interscalene brachial plexus block   Pre-Anesthetic Checklist: ,, timeout performed, Correct Patient, Correct Site, Correct Laterality, Correct Procedure,, site marked, risks and benefits discussed, Surgical consent,  Pre-op evaluation,  At surgeon's request and post-op pain management  Laterality: Left  Prep: chloraprep       Needles:  Injection technique: Single-shot  Needle Type: Echogenic Stimulator Needle     Needle Length: 9cm  Needle Gauge: 21     Additional Needles:   Procedures: ultrasound guided,,,,,,,,  Narrative:  Start time: 03/30/2017 12:05 PM End time: 03/30/2017 12:15 PM Injection made incrementally with aspirations every 5 mL.  Performed by: Personally  Anesthesiologist: Adele Barthel P  Additional Notes: Functioning IV was confirmed and monitors were applied.  A 91mm 21ga Arrow echogenic stimulator needle was used. Sterile prep, hand hygiene and sterile gloves were used.  Negative aspiration and negative test dose prior to incremental administration of local anesthetic. The patient tolerated the procedure well.

## 2017-03-30 NOTE — Op Note (Signed)
Procedure(s):  Jeffrey [name] male 51 y.o. 03/30/2017  Procedure(s) and Anesthesia Type:    LEFT SHOULDER ARTHROSCOPY WITH DEBRIDEMENT SLAP TEAR, BICEPS TENOTOMY    LEFT SHOULDER SUBACROMIAL DECOMPRESSION     LEFT SHOULDER OPEN BICEPS TENODESIS   Surgeon(s) and Role:    [name], MD - Primary     Surgeon: Nita Sells   Assistants: Jeanmarie Hubert PA-C [name] was present and scrubbed throughout the procedure and was essential in positioning, retraction, exposure, and closure)  Anesthesia: General endotracheal anesthesia with preoperative interscalene block given by the attending anesthesiologist    Procedure Detail   Estimated Blood Loss: Min         Drains: none  Blood Given: none         Specimens: none        Complications:  * No complications entered in OR log *         Disposition: PACU - hemodynamically stable.         Condition: stable    Procedure:   INDICATIONS FOR SURGERY: The patient is 51 y.o. male who has a two-year history of left shoulder pain which is failed extensive conservative management with injections, medications, rest, therapy. Indicated for surgical treatment to try and decrease pain and restore function.  OPERATIVE FINDINGS: Examination under anesthesia: No stiffness or instability  DESCRIPTION OF PROCEDURE: The patient was identified in preoperative  holding area where I personally marked the operative site after  verifying site, side, and procedure with the patient. An interscalene block was given by the attending anesthesiologist the holding area.  The patient was taken back to the operating room where general anesthesia was induced without complication and was placed in the beach-chair position with the back  elevated about 60 degrees and all extremities and head and neck carefully padded and  positioned.   The left upper extremity was then prepped and  draped in a standard sterile fashion. The  appropriate time-out  procedure was carried out. The patient did receive IV antibiotics  within 30 minutes of incision.   A small posterior portal incision was made and the arthroscope was introduced into the joint. An anterior portal was then established above the subscapularis using needle localization. Small cannula was placed anteriorly. Diagnostic arthroscopy was then carried out.  He was noted to have fair muscle synovitis in the joint. He had extensive superior labral tearing with the superior labrum pain management the joint. The biceps anchor was clearly involved with a lot of erythema around this area as well. The biceps tendon itself was involved at the base and there is a lot of tenosynovitis around the biceps when the extra-articular portion was pulled in to the joint. The decision was made at this point that the patient would benefit from biceps tenotomy and tenodesis. Therefore the ArthroCare was used to release the tendon from the superior labrum and then the shaver was used to extensively debride the labral tear back to healthy non-displaceable labrum. The remainder the joint looked good. There is no significant arthritis in the joint. The undersurface of the rotator cuff looked good with no partial tearing.  The arthroscope was then introduced into the subacromial space a standard lateral portal was established with needle localization. The shaver was used through the lateral portal to perform extensive bursectomy. Coracoacromial ligament was examined and found to be frayed indicating impingement.  The underlying rotator cuff was carefully examined and noted to be completely intact with no significant partial  tearing noted.  The coracoacromial ligament was taken down off the anterior acromion with the ArthroCare exposing a hooked anterior acromial spur. A high-speed bur was then used through the lateral portal to take down the anterior acromial spur from lateral to medial in a standard  acromioplasty.  The acromioplasty was also viewed from the lateral portal and the bur was used as necessary to ensure that the acromion was completely flat from posterior to anterior.  Attention was then turned to the axilla where a approximately 3 cm incision was made in the dominant axillary fold. This was about 50% above and 50% below the palpable lower border of the pectoralis major. Dissection was carried out between the lower border of the pectoralis major and the short head of the biceps muscle belly. The anterior humerus was then exposed and the long head biceps was delivered out through the wound. The biceps was prepared using a #2 FiberWire fiber loop and the remaining portion of the biceps tendon was discarded after choosing the appropriate tension and length. A drill bit slightly smaller than the tendon was used in the distal bicipital groove to create an intramedullary hole and then a drill bit about 12 mm distal to that was used which was slightly larger than the suture passer needle. A crescent suture lasso was then used to pass the sutures from proximal to distal and then one suture was brought around medial and lateral to the tendon. It was tensioned, dunking the tendon into the intramedullary canal and tied over the anterior portion of the tendon. The tension was felt to be appropriate. The wound was copiously irrigated with normal saline and subsequently closed in layers with 2-0 Vicryl in the deep dermal layer and Dermabond for skin closure.  The arthroscopic equipment was removed from the joint and the portals were closed with 3-0 nylon in an interrupted fashion. Sterile dressings were then applied including Xeroform 4 x 4's ABDs and tape. The patient was then allowed to awaken from general anesthesia, placed in a sling, transferred to the stretcher and taken to the recovery room in stable condition.   POSTOPERATIVE PLAN: The patient will be discharged home today and will followup in one  week for suture removal and wound check.

## 2017-03-30 NOTE — Discharge Instructions (Signed)

## 2017-03-30 NOTE — H&P (Signed)
Jeffrey [name] is an 51 y.o. male.   Chief Complaint: L shoulder pain and dysfunction HPI: L shoulder pain x 2 years failed extensive conservative management.  Pain is lateral and anterior.  Past Medical History:  Diagnosis Date  . Allergy   . ANXIETY 07/05/2007  . Arthritis 08/2015   neck and back  . Cancer ([place])    basal cell  . CARPAL TUNNEL SYNDROME, BILATERAL 07/05/2007  . Cervicalgia 03/18/2008  . DEPRESSION 07/05/2007  . DIABETES MELLITUS, TYPE II 07/05/2007  . ERECTILE DYSFUNCTION 07/11/2007  . GERD 07/11/2007  . HYPERLIPIDEMIA 07/05/2007  . HYPERTENSION 07/05/2007  . LOW BACK PAIN 07/11/2007  . Meralgia paresthetica 09/25/2008  . PEPTIC ULCER DISEASE 07/05/2007  . SINUSITIS- ACUTE-NOS 03/26/2009  . SKIN LESION 03/18/2008    Past Surgical History:  Procedure Laterality Date  . BASAL CELL CARCINOMA EXCISION Right 2011   shoulder area  . left wrist surgury/fracture  1985    Family History  Problem Relation Age of Onset  . Diabetes Mother   . Hypertension Mother   . Diabetes Father   . Hypertension Father   . Heart disease Maternal Grandmother   . Cancer Maternal Grandfather        colon cancer  . Diabetes Other   . Hypertension Other   . Colon cancer Neg Hx    Social History:  reports that he quit smoking about 3 years ago. His smoking use included Cigars. He uses smokeless tobacco. He reports that he drinks alcohol. He reports that he does not use drugs.  Allergies:  Allergies  Allergen Reactions  . Codeine Itching    Medications Prior to Admission  Medication Sig Dispense Refill  . ALPRAZolam (XANAX) 1 MG tablet Take 1 tablet (1 mg total) by mouth 2 (two) times daily as needed. (Patient taking differently: Take 1 mg by mouth 2 (two) times daily as needed for anxiety. ) 60 tablet 5  . aspirin EC 81 MG tablet Take 81 mg by mouth every evening.    Marland Kitchen atorvastatin (LIPITOR) 40 MG tablet Take 1 tablet (40 mg total) by mouth daily. (Patient taking differently:  Take 40 mg by mouth every evening. ) 90 tablet 3  . carisoprodol (SOMA) 350 MG tablet 1 tab by mouth daily as needed (Patient taking differently: Take 350 mg by mouth daily as needed for muscle spasms. ) 30 tablet 5  . gabapentin (NEURONTIN) 300 MG capsule Take 1 capsule (300 mg total) by mouth every 6 (six) hours as needed. (Patient taking differently: Take 300 mg by mouth every 4 (four) hours. ) 360 capsule 1  . glipiZIDE (GLIPIZIDE XL) 10 MG 24 hr tablet TAKE 1 and 1/2 TABLET (15 MG TOTAL) BY MOUTH DAILY WITH BREAKFAST. (Patient taking differently: Take 15 mg by mouth every evening. ) 135 tablet 3  . lisinopril (PRINIVIL,ZESTRIL) 40 MG tablet Take 1 tablet (40 mg total) by mouth daily. (Patient taking differently: Take 40 mg by mouth every evening. ) 90 tablet 3  . metFORMIN (GLUCOPHAGE) 500 MG tablet Take two tablets by mouth in the morning and take 2 tablets by mouth in the evening. (Patient taking differently: Take 1,000 mg by mouth 2 (two) times daily. ) 360 tablet 3  . nitroGLYCERIN (NITRODUR - DOSED IN MG/24 HR) 0.2 mg/hr patch APPLY 1/4 PATCH TO SKIN DAILY 30 patch 0  . pioglitazone (ACTOS) 45 MG tablet Take 1 tablet (45 mg total) by mouth daily. (Patient taking differently: Take 45 mg by mouth  every evening. ) 90 tablet 3    Results for orders placed or performed during the hospital encounter of 03/30/17 (from the past 48 hour(s))  Glucose, capillary     Status: Abnormal   Collection Time: 03/30/17 11:05 AM  Result Value Ref Range   Glucose-Capillary 157 (H) 65 - 99 mg/dL   Comment 1 Notify RN    Comment 2 Document in Chart   Basic metabolic panel     Status: Abnormal   Collection Time: 03/30/17 11:09 AM  Result Value Ref Range   Sodium 138 135 - 145 mmol/L   Potassium 4.0 3.5 - 5.1 mmol/L   Chloride 106 101 - 111 mmol/L   CO2 24 22 - 32 mmol/L   Glucose, Bld 155 (H) 65 - 99 mg/dL   BUN 12 6 - 20 mg/dL   Creatinine, Ser 0.82 0.61 - 1.24 mg/dL   Calcium 8.9 8.9 - 10.3 mg/dL    GFR calc non Af Amer >60 >60 mL/min   GFR calc Af Amer >60 >60 mL/min    Comment: (NOTE) The eGFR has been calculated using the CKD EPI equation. This calculation has not been validated in all clinical situations. eGFR's persistently <60 mL/min signify possible Chronic Kidney Disease.    Anion gap 8 5 - 15  CBC     Status: None   Collection Time: 03/30/17 11:09 AM  Result Value Ref Range   WBC 5.0 4.0 - 10.5 K/uL   RBC 4.99 4.22 - 5.81 MIL/uL   Hemoglobin 15.4 13.0 - 17.0 g/dL   HCT 45.0 39.0 - 52.0 %   MCV 90.2 78.0 - 100.0 fL   MCH 30.9 26.0 - 34.0 pg   MCHC 34.2 30.0 - 36.0 g/dL   RDW 13.1 11.5 - 15.5 %   Platelets 209 150 - 400 K/uL  Glucose, capillary     Status: Abnormal   Collection Time: 03/30/17  1:02 PM  Result Value Ref Range   Glucose-Capillary 129 (H) 65 - 99 mg/dL   Comment 1 Notify RN    Comment 2 Document in Chart    No results found.  Review of Systems  All other systems reviewed and are negative.   Blood pressure 134/69, pulse 84, temperature 98.1 F (36.7 C), temperature source Oral, resp. rate (!) 29, SpO2 96 %. Physical Exam  Constitutional: He is oriented to person, place, and time. He appears well-developed and well-nourished.  HENT:  Head: Atraumatic.  Eyes: EOM are normal.  Cardiovascular: Intact distal pulses.   Respiratory: Effort normal.  Musculoskeletal:  L shoulder pain with impingement, RC testing.  Neurological: He is alert and oriented to person, place, and time.  Skin: Skin is warm and dry.  Psychiatric: He has a normal mood and affect.     Assessment/Plan L shoulder pain x 2 years failed extensive conservative management.  Pain is lateral and anterior. Plan L shoulder arthroscopic RC debridement, SAD, poss biceps tenodesis. Risks / benefits of surgery discussed Consent on chart  NPO for OR Preop antibiotics   Nita Sells, MD 03/30/2017, 1:36 PM

## 2017-03-30 NOTE — Transfer of Care (Signed)
Immediate Anesthesia Transfer of Care Note  Patient: Jeffrey [name]  Procedure(s) Performed: Procedure(s) with comments: SHOULDER ARTHROSCOPY WITH DEBRIDEMENT ROTATOR CUFF,SUBACROMIAL DECOMPRESSION AND OPEN TENODESIS (Left) - SHOULDER ARTHROSCOPY WITH DEBRIDEMENT ROTATOR CUFF,SUBACROMIAL DECOMPRESSION AND POSSIBLE OPEN TENODESIS  Patient Location: PACU  Anesthesia Type:General  Level of Consciousness: awake  Airway & Oxygen Therapy: Patient Spontanous Breathing and Patient connected to nasal cannula oxygen  Post-op Assessment: Report given to RN and Post -op Vital signs reviewed and stable  Post vital signs: Reviewed and stable  Last Vitals:  Vitals:   03/30/17 1240 03/30/17 1540  BP: 134/69   Pulse: 84   Resp: (!) 29   Temp:  36.7 C  SpO2: 96%     Last Pain:  Vitals:   03/30/17 1111  TempSrc: Oral      Patients Stated Pain Goal: 3 (17/35/67 0141)  Complications: No apparent anesthesia complications

## 2017-03-30 NOTE — Anesthesia Procedure Notes (Signed)
Procedure Name: Intubation Date/Time: 03/30/2017 2:14 PM Performed by: [place] Breya Cass C Pre-anesthesia Checklist: Patient identified, Emergency Drugs available, Suction available and Patient being monitored Patient Re-evaluated:Patient Re-evaluated prior to induction Oxygen Delivery Method: Circle system utilized Preoxygenation: Pre-oxygenation with 100% oxygen Induction Type: IV induction Ventilation: Mask ventilation without difficulty Laryngoscope Size: Mac and 4 Grade View: Grade I Tube type: Oral Number of attempts: 1 Airway Equipment and Method: Stylet and Oral airway Placement Confirmation: ETT inserted through vocal cords under direct vision,  positive ETCO2 and breath sounds checked- equal and bilateral Secured at: 22 cm Tube secured with: Tape Dental Injury: Teeth and Oropharynx as per pre-operative assessment

## 2017-03-31 ENCOUNTER — Encounter (HOSPITAL_COMMUNITY): Payer: Self-pay | Admitting: Orthopedic Surgery

## 2017-03-31 NOTE — Anesthesia Postprocedure Evaluation (Signed)
Anesthesia Post Note  Patient: Jeffrey [name]  Procedure(s) Performed: Procedure(s) (LRB): SHOULDER ARTHROSCOPY WITH DEBRIDEMENT ROTATOR CUFF,SUBACROMIAL DECOMPRESSION AND OPEN TENODESIS (Left)     Patient location during evaluation: PACU Anesthesia Type: Regional and General Level of consciousness: awake and alert and patient cooperative Pain management: pain level controlled Vital Signs Assessment: post-procedure vital signs reviewed and stable Respiratory status: spontaneous breathing and respiratory function stable Cardiovascular status: stable Anesthetic complications: no    Last Vitals:  Vitals:   03/30/17 1710 03/30/17 1715  BP: 129/68   Pulse: 78   Resp: (!) 23   Temp: 36.7 C 36.7 C  SpO2: 94%     Last Pain:  Vitals:   03/30/17 1715  TempSrc:   PainSc: 0-No pain                 Tamya Denardo S

## 2017-04-27 ENCOUNTER — Other Ambulatory Visit: Payer: Self-pay | Admitting: Internal Medicine

## 2017-06-21 ENCOUNTER — Other Ambulatory Visit: Payer: Self-pay | Admitting: Internal Medicine

## 2017-06-21 ENCOUNTER — Ambulatory Visit: Payer: 59 | Admitting: Family Medicine

## 2017-06-21 ENCOUNTER — Encounter: Payer: Self-pay | Admitting: Family Medicine

## 2017-06-21 VITALS — BP 110/80 | HR 82 | Ht 71.0 in | Wt 274.0 lb

## 2017-06-21 DIAGNOSIS — M5010 Cervical disc disorder with radiculopathy, unspecified cervical region: Secondary | ICD-10-CM | POA: Diagnosis not present

## 2017-06-21 DIAGNOSIS — M5412 Radiculopathy, cervical region: Secondary | ICD-10-CM

## 2017-06-21 MED ORDER — METHYLPREDNISOLONE ACETATE 80 MG/ML IJ SUSP
80.0000 mg | Freq: Once | INTRAMUSCULAR | Status: AC
  Administered 2017-06-21: 80 mg via INTRAMUSCULAR

## 2017-06-21 MED ORDER — KETOROLAC TROMETHAMINE 60 MG/2ML IM SOLN
60.0000 mg | Freq: Once | INTRAMUSCULAR | Status: AC
Start: 2017-06-21 — End: 2017-06-21
  Administered 2017-06-21: 60 mg via INTRAMUSCULAR

## 2017-06-21 NOTE — Assessment & Plan Note (Signed)
Significant worsening of symptoms at this time.  Known to have moderate to severe arthritis.  Grip strength as well as decreased deep tendon reflexes.  I do feel advanced imaging is warranted with an MRI.  Patient would be a candidate for an epidural steroid injection.  Patient of course wants to avoid any type of surgical intervention.  Once patient is feeling better we will consider the possibility of restarting formal physical therapy if necessary.

## 2017-06-21 NOTE — Patient Instructions (Signed)
Sorry you are hurting.  We will get MRI to see what is going on in the neck  Epidural may be good  2 injections today  Ice is your friend Stay active If you do have epidural see me again 2 weeks after the injection.

## 2017-06-21 NOTE — Progress Notes (Signed)
[name] [address], [place] [address] Phone: [phone] Subjective:    I'm seeing this patient by the request  of:  Biagio Borg, MD   CC: Neck pain  [id number]  DECLIN Jeffrey [name] is a 51 y.o. male coming in with complaint of neck pain.  Patient was previously seen and found to have a partial rotator cuff tear and did have surgical intervention.  States that he is doing 80% better since then.  Patient continues to have some mild difficulty.  Patient though has noticed increasing neck pain.  He did have x-rays greater than 1 year ago that were independently visualized by me today.  X-rays show the patient does have moderate to severe degenerative disc disease at multiple levels.  Patient is having radiation down the left arm.  Has constant numbness in the middle and ring finger.  States that sometimes it seems to affect some of his grip strength.  Right sometimes the severity is 8 out of 10 and can wake him up at night.      Past Medical History:  Diagnosis Date  . Allergy   . ANXIETY 07/05/2007  . Arthritis 08/2015   neck and back  . Cancer ([place])    basal cell  . CARPAL TUNNEL SYNDROME, BILATERAL 07/05/2007  . Cervicalgia 03/18/2008  . DEPRESSION 07/05/2007  . DIABETES MELLITUS, TYPE II 07/05/2007  . ERECTILE DYSFUNCTION 07/11/2007  . GERD 07/11/2007  . HYPERLIPIDEMIA 07/05/2007  . HYPERTENSION 07/05/2007  . LOW BACK PAIN 07/11/2007  . Meralgia paresthetica 09/25/2008  . PEPTIC ULCER DISEASE 07/05/2007  . SINUSITIS- ACUTE-NOS 03/26/2009  . SKIN LESION 03/18/2008   Past Surgical History:  Procedure Laterality Date  . BASAL CELL CARCINOMA EXCISION Right 2011   shoulder area  . left wrist surgury/fracture  1985   Social History   Socioeconomic History  . Marital status: Married    Spouse name: None  . Number of children: 2  . Years of education: None  . Highest education level: None  Social Needs  . Financial resource  strain: None  . Food insecurity - worry: None  . Food insecurity - inability: None  . Transportation needs - medical: None  . Transportation needs - non-medical: None  Occupational History  . Occupation: Librarian, academic for [employer]  Tobacco Use  . Smoking status: Former Smoker    Types: Cigars    Last attempt to quit: 02/17/2014    Years since quitting: 3.3  . Smokeless tobacco: Current User  . Tobacco comment: ecigs  Substance and Sexual Activity  . Alcohol use: Yes    Alcohol/week: 0.0 oz    Comment: social  . Drug use: No  . Sexual activity: None  Other Topics Concern  . None  Social History Narrative  . None   Allergies  Allergen Reactions  . Codeine Itching   Family History  Problem Relation Age of Onset  . Diabetes Mother   . Hypertension Mother   . Diabetes Father   . Hypertension Father   . Heart disease Maternal Grandmother   . Cancer Maternal Grandfather        colon cancer  . Diabetes Other   . Hypertension Other   . Colon cancer Neg Hx      Past medical history, social, surgical and family history all reviewed in electronic medical record.  No pertanent information unless stated regarding to the chief complaint.   Review of Systems:Review of  systems updated and as accurate as of 06/21/17  No headache, visual changes, nausea, vomiting, diarrhea, constipation, dizziness, abdominal pain, skin rash, fevers, chills, night sweats, weight loss, swollen lymph nodes, body aches, joint swelling,chest pain, shortness of breath, mood changes.  Positive muscle aches  Objective  Blood pressure 110/80, pulse 82, height 5\' 11"  (1.803 m), weight 274 lb (124.3 kg), SpO2 96 %. Systems examined below as of 06/21/17   General: No apparent distress alert and oriented x3 mood and affect normal, dressed appropriately.  HEENT: Pupils equal, extraocular movements intact  Respiratory: Patient's speak in full sentences and does not appear short of breath    Cardiovascular: No lower extremity edema, non tender, no erythema  Skin: Warm dry intact with no signs of infection or rash on extremities or on axial skeleton.  Abdomen: Soft nontender  Neuro: Cranial nerves II through XII are intact, neurovascularly intact in all extremities with Limited range of motion lacking last 10 degrees of extension secondary to pain. and 2+ pulses.  Lymph: No lymphadenopathy of posterior or anterior cervical chain or axillae bilaterally.  Gait normal with good balance and coordination.  MSK:  Non tender with full range of motion and good stability and symmetric strength and tone of shoulders, elbows, wrist, hip, knee and ankles bilaterally.  Neck: Inspection mild loss of lordosis. No palpable stepoffs. Positive Spurling's maneuver with radicular symptoms going down the left arm mostly in the C7-C8 distribution. Minimal extension or left sided side bending due to pain Decreased extension of the neck with minimal side bending to the left  Minimal left-sided sidebending 4 out of 5 grip strength on the left side Strength C7 distribution on the left is decreased. No sensory change to C4 to T1 Decreased 1+ deep tendon reflex of the C7 distribution tricep, on the left 2+ on the right    Impression and Recommendations:     This case required medical decision making of moderate complexity.      Note: This dictation was prepared with Dragon dictation along with smaller phrase technology. Any transcriptional errors that result from this process are unintentional.

## 2017-06-21 NOTE — Progress Notes (Signed)
[name] Jeffrey [name], [place] [address] Phone: [phone] Subjective:    I'm seeing this patient by the request  of:    CC:   [id number]  Jeffrey [name] is a 51 y.o. male coming in with complaint of neck pain. He's been getting traction for his neck in PT. He has numbness from his elbow to finger tips in his left arm.   Onset-  Location Duration-  Character- Aggravating factors- Reliving factors-  Therapies tried-  Severity-     Past Medical History:  Diagnosis Date  . Allergy   . ANXIETY 07/05/2007  . Arthritis 08/2015   neck and back  . Cancer ([place])    basal cell  . CARPAL TUNNEL SYNDROME, BILATERAL 07/05/2007  . Cervicalgia 03/18/2008  . DEPRESSION 07/05/2007  . DIABETES MELLITUS, TYPE II 07/05/2007  . ERECTILE DYSFUNCTION 07/11/2007  . GERD 07/11/2007  . HYPERLIPIDEMIA 07/05/2007  . HYPERTENSION 07/05/2007  . LOW BACK PAIN 07/11/2007  . Meralgia paresthetica 09/25/2008  . PEPTIC ULCER DISEASE 07/05/2007  . SINUSITIS- ACUTE-NOS 03/26/2009  . SKIN LESION 03/18/2008   Past Surgical History:  Procedure Laterality Date  . BASAL CELL CARCINOMA EXCISION Right 2011   shoulder area  . left wrist surgury/fracture  1985   Social History   Socioeconomic History  . Marital status: Married    Spouse name: Not on file  . Number of children: 2  . Years of education: Not on file  . Highest education level: Not on file  Social Needs  . Financial resource strain: Not on file  . Food insecurity - worry: Not on file  . Food insecurity - inability: Not on file  . Transportation needs - medical: Not on file  . Transportation needs - non-medical: Not on file  Occupational History  . Occupation: Librarian, academic for [employer]  Tobacco Use  . Smoking status: Former Smoker    Types: Cigars    Last attempt to quit: 02/17/2014    Years since quitting: 3.3  . Smokeless tobacco: Current User  . Tobacco  comment: ecigs  Substance and Sexual Activity  . Alcohol use: Yes    Alcohol/week: 0.0 oz    Comment: social  . Drug use: No  . Sexual activity: Not on file  Other Topics Concern  . Not on file  Social History Narrative  . Not on file   Allergies  Allergen Reactions  . Codeine Itching   Family History  Problem Relation Age of Onset  . Diabetes Mother   . Hypertension Mother   . Diabetes Father   . Hypertension Father   . Heart disease Maternal Grandmother   . Cancer Maternal Grandfather        colon cancer  . Diabetes Other   . Hypertension Other   . Colon cancer Neg Hx      Past medical history, social, surgical and family history all reviewed in electronic medical record.  No pertanent information unless stated regarding to the chief complaint.   Review of Systems:Review of systems updated and as accurate as of 06/21/17  No headache, visual changes, nausea, vomiting, diarrhea, constipation, dizziness, abdominal pain, skin rash, fevers, chills, night sweats, weight loss, swollen lymph nodes, body aches, joint swelling, muscle aches, chest pain, shortness of breath, mood changes.   Objective  There were no vitals taken for this visit. Systems examined below as of 06/21/17   General: No apparent distress alert and oriented x3  mood and affect normal, dressed appropriately.  HEENT: Pupils equal, extraocular movements intact  Respiratory: Patient's speak in full sentences and does not appear short of breath  Cardiovascular: No lower extremity edema, non tender, no erythema  Skin: Warm dry intact with no signs of infection or rash on extremities or on axial skeleton.  Abdomen: Soft nontender  Neuro: Cranial nerves II through XII are intact, neurovascularly intact in all extremities with 2+ DTRs and 2+ pulses.  Lymph: No lymphadenopathy of posterior or anterior cervical chain or axillae bilaterally.  Gait normal with good balance and coordination.  MSK:  Non tender with  full range of motion and good stability and symmetric strength and tone of shoulders, elbows, wrist, hip, knee and ankles bilaterally.     Impression and Recommendations:     This case required medical decision making of moderate complexity.      Note: This dictation was prepared with Dragon dictation along with smaller phrase technology. Any transcriptional errors that result from this process are unintentional.

## 2017-06-22 ENCOUNTER — Other Ambulatory Visit: Payer: Self-pay | Admitting: Family Medicine

## 2017-06-22 NOTE — Telephone Encounter (Signed)
Refill done.  

## 2017-06-23 ENCOUNTER — Encounter: Payer: Self-pay | Admitting: Family Medicine

## 2017-06-23 ENCOUNTER — Other Ambulatory Visit: Payer: Self-pay | Admitting: Family Medicine

## 2017-06-23 MED ORDER — GABAPENTIN 300 MG PO CAPS: 300.0000 mg | ORAL_CAPSULE | Freq: Four times a day (QID) | ORAL | 6 refills | Status: DC

## 2017-07-09 ENCOUNTER — Ambulatory Visit
Admission: RE | Admit: 2017-07-09 | Discharge: 2017-07-09 | Disposition: A | Discharge status: Home / Self Care | Payer: 59 | Source: Ambulatory Visit | Attending: Family Medicine | Admitting: Family Medicine

## 2017-07-09 DIAGNOSIS — M5412 Radiculopathy, cervical region: Secondary | ICD-10-CM

## 2017-07-10 ENCOUNTER — Encounter: Payer: Self-pay | Admitting: Family Medicine

## 2017-07-10 DIAGNOSIS — M5010 Cervical disc disorder with radiculopathy, unspecified cervical region: Secondary | ICD-10-CM

## 2017-07-25 ENCOUNTER — Encounter: Payer: Self-pay | Admitting: Family Medicine

## 2017-08-10 DIAGNOSIS — M50222 Other cervical disc displacement at C5-C6 level: Secondary | ICD-10-CM | POA: Insufficient documentation

## 2017-08-21 ENCOUNTER — Other Ambulatory Visit (INDEPENDENT_AMBULATORY_CARE_PROVIDER_SITE_OTHER): Payer: 59

## 2017-08-21 DIAGNOSIS — E119 Type 2 diabetes mellitus without complications: Secondary | ICD-10-CM | POA: Diagnosis not present

## 2017-08-21 DIAGNOSIS — Z114 Encounter for screening for human immunodeficiency virus [HIV]: Secondary | ICD-10-CM

## 2017-08-21 LAB — BASIC METABOLIC PANEL
BUN: 11 mg/dL (ref 6–23)
CALCIUM: 8.9 mg/dL (ref 8.4–10.5)
CO2: 26 mEq/L (ref 19–32)
Chloride: 103 mEq/L (ref 96–112)
Creatinine, Ser: 0.87 mg/dL (ref 0.40–1.50)
GFR: 97.36 mL/min (ref >60.00)
GLUCOSE: 149 mg/dL — AB (ref 70–99)
Potassium: 4.3 mEq/L (ref 3.5–5.1)
SODIUM: 137 meq/L (ref 135–145)

## 2017-08-21 LAB — HEPATIC FUNCTION PANEL
ALBUMIN: 4.2 g/dL (ref 3.5–5.2)
ALT: 23 U/L (ref 0–53)
AST: 15 U/L (ref 0–37)
Alkaline Phosphatase: 51 U/L (ref 39–117)
BILIRUBIN TOTAL: 0.6 mg/dL (ref 0.2–1.2)
Bilirubin, Direct: 0.2 mg/dL (ref 0.0–0.3)
Total Protein: 6.2 g/dL (ref 6.0–8.3)

## 2017-08-21 LAB — LIPID PANEL
CHOLESTEROL: 118 mg/dL (ref 0–200)
HDL: 29.1 mg/dL — AB (ref >39.00)
LDL Cholesterol: 71 mg/dL (ref 0–99)
NONHDL: 89.06
TRIGLYCERIDES: 92 mg/dL (ref 0.0–149.0)
Total CHOL/HDL Ratio: 4
VLDL: 18.4 mg/dL (ref 0.0–40.0)

## 2017-08-21 LAB — HEMOGLOBIN A1C: Hgb A1c MFr Bld: 6.6 % — ABNORMAL HIGH (ref 4.6–6.5)

## 2017-08-22 LAB — HIV ANTIBODY (ROUTINE TESTING W REFLEX): HIV 1&2 Ab, 4th Generation: NONREACTIVE

## 2017-08-25 ENCOUNTER — Encounter: Payer: Self-pay | Admitting: Internal Medicine

## 2017-08-25 ENCOUNTER — Ambulatory Visit: Payer: 59 | Admitting: Internal Medicine

## 2017-08-25 VITALS — BP 120/76 | HR 89 | Temp 98.5°F | Ht 71.0 in | Wt 271.0 lb

## 2017-08-25 DIAGNOSIS — E119 Type 2 diabetes mellitus without complications: Secondary | ICD-10-CM

## 2017-08-25 DIAGNOSIS — Z0000 Encounter for general adult medical examination without abnormal findings: Secondary | ICD-10-CM

## 2017-08-25 MED ORDER — CARISOPRODOL 350 MG PO TABS: ORAL_TABLET | ORAL | 5 refills | Status: DC

## 2017-08-25 MED ORDER — LISINOPRIL 40 MG PO TABS: 40.0000 mg | ORAL_TABLET | Freq: Every day | ORAL | 3 refills | Status: DC

## 2017-08-25 MED ORDER — PIOGLITAZONE HCL 45 MG PO TABS: 45.0000 mg | ORAL_TABLET | Freq: Every day | ORAL | 3 refills | Status: DC

## 2017-08-25 MED ORDER — ALPRAZOLAM 1 MG PO TABS: 1.0000 mg | ORAL_TABLET | Freq: Two times a day (BID) | ORAL | 5 refills | Status: DC | PRN

## 2017-08-25 MED ORDER — ATORVASTATIN CALCIUM 40 MG PO TABS: 40.0000 mg | ORAL_TABLET | Freq: Every day | ORAL | 3 refills | Status: DC

## 2017-08-25 MED ORDER — METFORMIN HCL 500 MG PO TABS: ORAL_TABLET | ORAL | 3 refills | Status: DC

## 2017-08-25 MED ORDER — GLIPIZIDE ER 10 MG PO TB24: ORAL_TABLET | ORAL | 3 refills | Status: DC

## 2017-08-25 NOTE — Patient Instructions (Signed)
Please continue all other medications as before, and refills have been done if requested.  Please have the pharmacy call with any other refills you may need.  Please continue your efforts at being more active, low cholesterol diet, and weight control.  You are otherwise up to date with prevention measures today.  Please keep your appointments with your specialists as you may have planned  Please return in 6 months, or sooner if needed, with Lab testing done 3-5 days before  

## 2017-08-25 NOTE — Progress Notes (Signed)
Subjective:    Patient ID: Jeffrey [name], male    DOB: 17-Sep-1965, 52 y.o.   MRN: [id number]  HPI  Here for wellness and f/u;  Overall doing ok;  Pt denies Chest pain, worsening SOB, DOE, wheezing, orthopnea, PND, worsening LE edema, palpitations, dizziness or syncope.  Pt denies neurological change such as new headache, facial or extremity weakness.  Pt denies polydipsia, polyuria, or low sugar symptoms. Pt states overall good compliance with treatment and medications, good tolerability, and has been trying to follow appropriate diet.  Pt denies worsening depressive symptoms, suicidal ideation or panic. No fever, night sweats, wt loss, loss of appetite, or other constitutional symptoms.  Pt states good ability with ADL's, has low fall risk, home safety reviewed and adequate, no other significant changes in hearing or vision, and only occasionally active with exercise.  Wt Readings from Last 3 Encounters:  08/25/17 271 lb (122.9 kg)  06/21/17 274 lb (124.3 kg)  02/23/17 270 lb (122.5 kg)  Also s/p left bicep tear with repair and rot cuff repair per Dr Artemio Aly.  Feels 85% back to baseline  Due to see Dr Stern jan 23 about for cspine radiculopathy, some improved with PT already, especially the traction. No other interval hx or new complaints Past Medical History:  Diagnosis Date  . Allergy   . ANXIETY 07/05/2007  . Arthritis 08/2015   neck and back  . Cancer ([place])    basal cell  . CARPAL TUNNEL SYNDROME, BILATERAL 07/05/2007  . Cervicalgia 03/18/2008  . DEPRESSION 07/05/2007  . DIABETES MELLITUS, TYPE II 07/05/2007  . ERECTILE DYSFUNCTION 07/11/2007  . GERD 07/11/2007  . HYPERLIPIDEMIA 07/05/2007  . HYPERTENSION 07/05/2007  . LOW BACK PAIN 07/11/2007  . Meralgia paresthetica 09/25/2008  . PEPTIC ULCER DISEASE 07/05/2007  . SINUSITIS- ACUTE-NOS 03/26/2009  . SKIN LESION 03/18/2008   Past Surgical History:  Procedure Laterality Date  . BASAL CELL CARCINOMA EXCISION Right 2011   shoulder area  . left wrist surgury/fracture  1985  . SHOULDER ARTHROSCOPY WITH SUBACROMIAL DECOMPRESSION Left 03/30/2017   Procedure: SHOULDER ARTHROSCOPY WITH DEBRIDEMENT ROTATOR CUFF,SUBACROMIAL DECOMPRESSION AND OPEN TENODESIS;  Surgeon: [name], MD;  Location: [place];  Service: Orthopedics;  Laterality: Left;  SHOULDER ARTHROSCOPY WITH DEBRIDEMENT ROTATOR CUFF,SUBACROMIAL DECOMPRESSION AND POSSIBLE OPEN TENODESIS    reports that he quit smoking about 3 years ago. His smoking use included cigars. He uses smokeless tobacco. He reports that he drinks alcohol. He reports that he does not use drugs. family history includes Cancer in his maternal grandfather; Diabetes in his father, mother, and other; Heart disease in his maternal grandmother; Hypertension in his father, mother, and other. Allergies  Allergen Reactions  . Codeine Itching   Current Outpatient Medications on File Prior to Visit  Medication Sig Dispense Refill  . aspirin EC 81 MG tablet Take 81 mg by mouth every evening.    . gabapentin (NEURONTIN) 300 MG capsule Take 1 capsule (300 mg total) 4 (four) times daily by mouth. 120 capsule 6   No current facility-administered medications on file prior to visit.    Review of Systems Constitutional: Negative for other unusual diaphoresis, sweats, appetite or weight changes HENT: Negative for other worsening hearing loss, ear pain, facial swelling, mouth sores or neck stiffness.   Eyes: Negative for other worsening pain, redness or other visual disturbance.  Respiratory: Negative for other stridor or swelling Cardiovascular: Negative for other palpitations or other chest pain  Gastrointestinal: Negative for worsening diarrhea or loose  stools, blood in stool, distention or other pain Genitourinary: Negative for hematuria, flank pain or other change in urine volume.  Musculoskeletal: Negative for myalgias or other joint swelling.  Skin: Negative for other color change, or other  wound or worsening drainage.  Neurological: Negative for other syncope or numbness. Hematological: Negative for other adenopathy or swelling Psychiatric/Behavioral: Negative for hallucinations, other worsening agitation, SI, self-injury, or new decreased concentration All other system neg per pt    Objective:   Physical Exam BP 120/76   Pulse 89   Temp 98.5 F (36.9 C) (Oral)   Ht 5\' 11"  (1.803 m)   Wt 271 lb (122.9 kg)   SpO2 97%   BMI 37.80 kg/m  VS noted,  Constitutional: Pt is oriented to person, place, and time. Appears well-developed and well-nourished, in no significant distress and comfortable Head: Normocephalic and atraumatic  Eyes: Conjunctivae and EOM are normal. Pupils are equal, round, and reactive to light Right Ear: External ear normal without discharge Left Ear: External ear normal without discharge Nose: Nose without discharge or deformity Mouth/Throat: Oropharynx is without other ulcerations and moist  Neck: Normal range of motion. Neck supple. No JVD present. No tracheal deviation present or significant neck LA or mass Cardiovascular: Normal rate, regular rhythm, normal heart sounds and intact distal pulses.   Pulmonary/Chest: WOB normal and breath sounds without rales or wheezing  Abdominal: Soft. Bowel sounds are normal. NT. No HSM  Musculoskeletal: Normal range of motion. Exhibits no edema Lymphadenopathy: Has no other cervical adenopathy.  Neurological: Pt is alert and oriented to person, place, and time. Pt has normal reflexes. No cranial nerve deficit. Motor grossly intact, Gait intact Skin: Skin is warm and dry. No rash noted or new ulcerations Psychiatric:  Has normal mood and affect. Behavior is normal without agitation No other exam findings    Assessment & Plan:

## 2017-08-26 NOTE — Assessment & Plan Note (Signed)

## 2017-08-26 NOTE — Assessment & Plan Note (Signed)
Lab Results  Component Value Date   HGBA1C 6.6 (H) 08/21/2017  stable overall by history and exam, recent data reviewed with pt, and pt to continue medical treatment as before,  to f/u any worsening symptoms or concerns

## 2017-09-06 ENCOUNTER — Encounter: Payer: Self-pay | Admitting: Family Medicine

## 2017-09-06 DIAGNOSIS — M5020 Other cervical disc displacement, unspecified cervical region: Secondary | ICD-10-CM | POA: Insufficient documentation

## 2017-09-10 ENCOUNTER — Encounter (HOSPITAL_COMMUNITY): Payer: Self-pay | Admitting: Emergency Medicine

## 2017-09-10 ENCOUNTER — Emergency Department (HOSPITAL_COMMUNITY)
Admission: EM | Admit: 2017-09-10 | Discharge: 2017-09-10 | Disposition: A | Discharge status: Home / Self Care | Payer: 59 | Attending: Emergency Medicine | Admitting: Emergency Medicine

## 2017-09-10 ENCOUNTER — Other Ambulatory Visit: Payer: Self-pay

## 2017-09-10 DIAGNOSIS — M545 Low back pain, unspecified: Secondary | ICD-10-CM

## 2017-09-10 DIAGNOSIS — Z7982 Long term (current) use of aspirin: Secondary | ICD-10-CM | POA: Diagnosis not present

## 2017-09-10 DIAGNOSIS — Z87891 Personal history of nicotine dependence: Secondary | ICD-10-CM | POA: Diagnosis not present

## 2017-09-10 DIAGNOSIS — Z7984 Long term (current) use of oral hypoglycemic drugs: Secondary | ICD-10-CM | POA: Diagnosis not present

## 2017-09-10 DIAGNOSIS — I10 Essential (primary) hypertension: Secondary | ICD-10-CM | POA: Insufficient documentation

## 2017-09-10 DIAGNOSIS — G8929 Other chronic pain: Secondary | ICD-10-CM | POA: Diagnosis not present

## 2017-09-10 DIAGNOSIS — Z79899 Other long term (current) drug therapy: Secondary | ICD-10-CM | POA: Diagnosis not present

## 2017-09-10 DIAGNOSIS — E119 Type 2 diabetes mellitus without complications: Secondary | ICD-10-CM | POA: Diagnosis not present

## 2017-09-10 LAB — URINALYSIS, ROUTINE W REFLEX MICROSCOPIC
BILIRUBIN URINE: NEGATIVE
Bacteria, UA: NONE SEEN
Hgb urine dipstick: NEGATIVE
KETONES UR: NEGATIVE mg/dL
LEUKOCYTES UA: NEGATIVE
NITRITE: NEGATIVE
PH: 6 (ref 5.0–8.0)
Protein, ur: NEGATIVE mg/dL
Specific Gravity, Urine: 1.01 (ref 1.005–1.030)
Squamous Epithelial / LPF: NONE SEEN

## 2017-09-10 LAB — I-STAT CHEM 8, ED
BUN: 6 mg/dL (ref 6–20)
CALCIUM ION: 1.17 mmol/L (ref 1.15–1.40)
CHLORIDE: 100 mmol/L — AB (ref 101–111)
Creatinine, Ser: 0.7 mg/dL (ref 0.61–1.24)
Glucose, Bld: 171 mg/dL — ABNORMAL HIGH (ref 65–99)
HCT: 46 % (ref 39.0–52.0)
Hemoglobin: 15.6 g/dL (ref 13.0–17.0)
POTASSIUM: 4.4 mmol/L (ref 3.5–5.1)
Sodium: 137 mmol/L (ref 135–145)
TCO2: 25 mmol/L (ref 22–32)

## 2017-09-10 MED ORDER — DIPHENHYDRAMINE HCL 25 MG PO CAPS
25.0000 mg | ORAL_CAPSULE | Freq: Once | ORAL | Status: AC
  Administered 2017-09-10: 25 mg via ORAL
  Filled 2017-09-10: qty 1

## 2017-09-10 MED ORDER — HYDROCODONE-ACETAMINOPHEN 5-325 MG PO TABS
1.0000 | ORAL_TABLET | Freq: Once | ORAL | Status: AC
  Administered 2017-09-10: 1 via ORAL
  Filled 2017-09-10: qty 1

## 2017-09-10 NOTE — Discharge Instructions (Addendum)
As discussed, The medicine prescribed can help with muscle spasm but cannot be taken if driving, with alcohol or operating machinery. You may want to try gentle stretching and heat to help with muscle spasm.  Apply heat to the area and work on modifying the way you sleep.  Back exercises and may try to stay ahead of the pain with ibuprofen 800 every 8 hours over the next week and Tylenol 1000 mg every 6 hours as needed.  Make sure that you do not exceed 4000 mg of acetaminophen-containing products in 24-hour period.  Follow up with your Primary care provider if symptoms  persist beyond a week.  Return if worsening or new concerning symptoms in the meantime.

## 2017-09-10 NOTE — ED Triage Notes (Signed)
Pt reports pain in r/lower back, at the right hip. Pain increasing over last few days. Denies blood in urine. Denies weakness in legs

## 2017-09-10 NOTE — ED Notes (Signed)
Bed: WTR8 Expected date:  Expected time:  Means of arrival:  Comments: 

## 2017-09-10 NOTE — ED Provider Notes (Signed)
[facility] DEPT Provider Note   CSN: [id number] Arrival date & time: 09/10/17  1005     History   Chief Complaint Chief Complaint  Patient presents with  . Back Pain    HPI Jeffrey [name] is a 52 y.o. male with past medical history significant for chronic low back pain, neck pain, diabetes, hypertension, hyperlipidemia presenting with 3 days of progressive sharp right lower back pain.  He reports that his pain is worse upon awakening in the morning and improves as he moves throughout the day.  And is located at the top of his iliac crest on the right.  The pain is sharp and nonradiating and today he woke up with the pain but has not gotten any relief after his muscle relaxant. He has not tried anything else for pain.  He denies any numbness, weakness, loss of bowel bladder function, history of IV drug use, fever, chills, dysuria or other symptoms. He explains that he has been thinking that his mattress is causing him pain since he feels fine before he goes to bed and wakes up with this pain.  He recently started having some neck problems for which he is followed by orthopedics and is a side sleeper now on the affected side due to neck discomfort, which correlates with the onset of his symptoms.  He denies any injury or trauma.  HPI  Past Medical History:  Diagnosis Date  . Allergy   . ANXIETY 07/05/2007  . Arthritis 08/2015   neck and back  . Cancer ([place])    basal cell  . CARPAL TUNNEL SYNDROME, BILATERAL 07/05/2007  . Cervicalgia 03/18/2008  . DEPRESSION 07/05/2007  . DIABETES MELLITUS, TYPE II 07/05/2007  . ERECTILE DYSFUNCTION 07/11/2007  . GERD 07/11/2007  . HYPERLIPIDEMIA 07/05/2007  . HYPERTENSION 07/05/2007  . LOW BACK PAIN 07/11/2007  . Meralgia paresthetica 09/25/2008  . PEPTIC ULCER DISEASE 07/05/2007  . SINUSITIS- ACUTE-NOS 03/26/2009  . SKIN LESION 03/18/2008    Patient Active Problem List   Diagnosis Date Noted  . Finger,  superficial foreign body (splinter), initial encounter 08/23/2016  . Skin lesion 08/23/2016  . Vitamin D deficiency 02/24/2016  . Labral tear of long head of left biceps tendon 01/22/2016  . Polyarthralgia 01/22/2016  . Cervical disc disorder with radiculopathy of cervical region 09/07/2015  . Shoulder bursitis 09/07/2015  . Left shoulder pain 08/18/2015  . Polycythemia, secondary 08/30/2013  . Allergic rhinitis, cause unspecified 08/30/2013  . Abnormal urine color 05/24/2013  . Right flank pain 05/24/2013  . Skin cancer, basal cell 12/27/2010  . Right shoulder pain 12/27/2010  . Right hand pain 12/27/2010  . Preventative health care 12/26/2010  . Meralgia paresthetica 09/25/2008  . Cervicalgia 03/18/2008  . ERECTILE DYSFUNCTION 07/11/2007  . GERD 07/11/2007  . LOW BACK PAIN 07/11/2007  . Diabetes ([place]) 07/05/2007  . Hyperlipidemia 07/05/2007  . ANXIETY 07/05/2007  . DEPRESSION 07/05/2007  . CARPAL TUNNEL SYNDROME, BILATERAL 07/05/2007  . Essential hypertension 07/05/2007  . PEPTIC ULCER DISEASE 07/05/2007    Past Surgical History:  Procedure Laterality Date  . BASAL CELL CARCINOMA EXCISION Right 2011   shoulder area  . left wrist surgury/fracture  1985  . SHOULDER ARTHROSCOPY WITH SUBACROMIAL DECOMPRESSION Left 03/30/2017   Procedure: SHOULDER ARTHROSCOPY WITH DEBRIDEMENT ROTATOR CUFF,SUBACROMIAL DECOMPRESSION AND OPEN TENODESIS;  Surgeon: [name], MD;  Location: [place] [facility];  Service: Orthopedics;  Laterality: Left;  SHOULDER ARTHROSCOPY WITH DEBRIDEMENT ROTATOR CUFF,SUBACROMIAL DECOMPRESSION AND POSSIBLE OPEN TENODESIS  . WISDOM  TOOTH EXTRACTION         Home Medications    Prior to Admission medications   Medication Sig Start Date End Date Taking? Authorizing Provider  ALPRAZolam Duanne Moron) 1 MG tablet Take 1 tablet (1 mg total) by mouth 2 (two) times daily as needed. 08/25/17   Biagio Borg, MD  aspirin EC 81 MG tablet Take 81 mg by mouth every evening.    [provider]  atorvastatin (LIPITOR) 40 MG tablet Take 1 tablet (40 mg total) by mouth daily. 08/25/17   Biagio Borg, MD  carisoprodol (SOMA) 350 MG tablet 1 tab by mouth daily as needed 08/25/17   Biagio Borg, MD  gabapentin (NEURONTIN) 300 MG capsule Take 1 capsule (300 mg total) 4 (four) times daily by mouth. 06/23/17   Lyndal Pulley, DO  glipiZIDE (GLUCOTROL XL) 10 MG 24 hr tablet 1 and 1/2 tab by mouth daily 08/25/17   Biagio Borg, MD  lisinopril (PRINIVIL,ZESTRIL) 40 MG tablet Take 1 tablet (40 mg total) by mouth daily. 08/25/17   Biagio Borg, MD  metFORMIN (GLUCOPHAGE) 500 MG tablet TAKE 2 TABLETS BY MOUTH EVERY MORNING AND TAKE 2 TABLETS BY MOUTH EVERY EVENING 08/25/17   Biagio Borg, MD  pioglitazone (ACTOS) 45 MG tablet Take 1 tablet (45 mg total) by mouth daily. 08/25/17   Biagio Borg, MD    Family History Family History  Problem Relation Age of Onset  . Diabetes Mother   . Hypertension Mother   . Diabetes Father   . Hypertension Father   . Heart disease Maternal Grandmother   . Cancer Maternal Grandfather        colon cancer  . Diabetes Other   . Hypertension Other   . Colon cancer Neg Hx     Social History Social History   Tobacco Use  . Smoking status: Former Smoker    Types: Cigars    Last attempt to quit: 02/17/2014    Years since quitting: 3.5  . Smokeless tobacco: Current User  . Tobacco comment: ecigs  Substance Use Topics  . Alcohol use: Yes    Alcohol/week: 0.0 oz    Comment: social  . Drug use: No     Allergies   Codeine   Review of Systems Review of Systems  Constitutional: Negative for chills and fever.  Respiratory: Negative for chest tightness and shortness of breath.   Cardiovascular: Negative for chest pain and palpitations.  Gastrointestinal: Negative for abdominal pain, nausea and vomiting.  Genitourinary: Negative for difficulty urinating, dysuria, flank pain and hematuria.  Musculoskeletal: Positive for back pain, myalgias  and neck pain. Negative for arthralgias, gait problem, joint swelling and neck stiffness.       Chronic neck pain followed by ortho  Skin: Negative for color change, pallor, rash and wound.  Neurological: Negative for weakness and numbness.     Physical Exam Updated Vital Signs BP 104/67 (BP Location: Left Arm)   Pulse 83   Temp 97.8 F (36.6 C) (Oral)   Resp 20   Wt 122.5 kg (270 lb)   SpO2 99%   BMI 37.66 kg/m   Physical Exam  Constitutional: He appears well-developed and well-nourished. No distress.  Afebrile, nontoxic-appearing, sitting comfortably in chair in no acute distress.  HENT:  Head: Normocephalic and atraumatic.  Eyes: Conjunctivae are normal. Right eye exhibits no discharge. Left eye exhibits no discharge.  Neck: Normal range of motion.  Cardiovascular: Normal rate and regular  rhythm.  Pulmonary/Chest: Effort normal. No respiratory distress.  Abdominal: He exhibits no distension.  Musculoskeletal: Normal range of motion. He exhibits tenderness. He exhibits no edema or deformity.  Tender to deep palpation of iliac crest on the right.  No midline tenderness palpation of the spine.  No tenderness palpation of the right hip.  No pain with ambulation or weight bearing.  Neurological: He is alert. No sensory deficit. He exhibits normal muscle tone.  Normal stance and gait, 5 out of 5 strength to lower extremities bilaterally, hip flexion, knee flexion extension, plantar flexion dorsiflexion.  Strong dorsalis pedis pulses. Neurovascularly intact.  Skin: Skin is warm and dry. No rash noted. He is not diaphoretic. No erythema. No pallor.  Psychiatric: He has a normal mood and affect.  Nursing note and vitals reviewed.    ED Treatments / Results  Labs (all labs ordered are listed, but only abnormal results are displayed) Labs Reviewed  URINALYSIS, ROUTINE W REFLEX MICROSCOPIC - Abnormal; Notable for the following components:      Result Value   Glucose, UA >=500 (*)     All other components within normal limits  I-STAT CHEM 8, ED - Abnormal; Notable for the following components:   Chloride 100 (*)    Glucose, Bld 171 (*)    All other components within normal limits    EKG  EKG Interpretation None       Radiology No results found.  Procedures Procedures (including critical care time)  Medications Ordered in ED Medications  HYDROcodone-acetaminophen (NORCO/VICODIN) 5-325 MG per tablet 1 tablet (1 tablet Oral Given 09/10/17 1325)  diphenhydrAMINE (BENADRYL) capsule 25 mg (25 mg Oral Given 09/10/17 1325)     Initial Impression / Assessment and Plan / ED Course  I have reviewed the triage vital signs and the nursing notes.  Pertinent labs & imaging results that were available during my care of the patient were reviewed by me and considered in my medical decision making (see chart for details).    Patient presents with lower back pain.  No gross neurological deficits and normal neuro exam.  Patient has no gait abnormality or concern for cauda equina.  No loss of bowel or bladder control, fever, night sweats, weight loss, h/o malignancy, or IVDU.  RICE protocol and pain medications indicated and discussed with patient.   UA without signs of infection.  Glucose over 500 in the urine, ordered i-STAT Chem-8 to rule out DKA. He is well-appearing afebrile nontoxic. BG 171 on i-STAT Normal calculated anion gap  Patient's pain was managed while in the emergency department.  Will discharge home with symptomatic relief, back exercises and close follow-up with PCP and orthopedics.  Discussed strict return precautions and advised to return to the emergency department if experiencing any new or worsening symptoms. Instructions were understood and patient agreed with discharge plan. Final Clinical Impressions(s) / ED Diagnoses   Final diagnoses:  Acute right-sided low back pain without sciatica    ED Discharge Orders    None       Dossie Der 09/10/17 2328    Lacretia Leigh, MD 09/11/17 [id number]

## 2017-09-11 ENCOUNTER — Encounter (HOSPITAL_COMMUNITY): Payer: Self-pay | Admitting: Emergency Medicine

## 2017-09-11 ENCOUNTER — Emergency Department (HOSPITAL_COMMUNITY)
Admission: EM | Admit: 2017-09-11 | Discharge: 2017-09-11 | Disposition: A | Discharge status: Home / Self Care | Payer: 59 | Attending: Emergency Medicine | Admitting: Emergency Medicine

## 2017-09-11 ENCOUNTER — Emergency Department (HOSPITAL_COMMUNITY): Payer: 59

## 2017-09-11 DIAGNOSIS — Z7984 Long term (current) use of oral hypoglycemic drugs: Secondary | ICD-10-CM | POA: Diagnosis not present

## 2017-09-11 DIAGNOSIS — I10 Essential (primary) hypertension: Secondary | ICD-10-CM | POA: Diagnosis not present

## 2017-09-11 DIAGNOSIS — M533 Sacrococcygeal disorders, not elsewhere classified: Secondary | ICD-10-CM

## 2017-09-11 DIAGNOSIS — M5489 Other dorsalgia: Secondary | ICD-10-CM | POA: Insufficient documentation

## 2017-09-11 DIAGNOSIS — Z79899 Other long term (current) drug therapy: Secondary | ICD-10-CM | POA: Insufficient documentation

## 2017-09-11 DIAGNOSIS — E785 Hyperlipidemia, unspecified: Secondary | ICD-10-CM | POA: Insufficient documentation

## 2017-09-11 DIAGNOSIS — Z7982 Long term (current) use of aspirin: Secondary | ICD-10-CM | POA: Insufficient documentation

## 2017-09-11 DIAGNOSIS — E119 Type 2 diabetes mellitus without complications: Secondary | ICD-10-CM | POA: Insufficient documentation

## 2017-09-11 DIAGNOSIS — Z87891 Personal history of nicotine dependence: Secondary | ICD-10-CM | POA: Insufficient documentation

## 2017-09-11 LAB — BASIC METABOLIC PANEL
Anion gap: 6 (ref 5–15)
BUN: 16 mg/dL (ref 6–20)
CHLORIDE: 104 mmol/L (ref 101–111)
CO2: 26 mmol/L (ref 22–32)
Calcium: 8.9 mg/dL (ref 8.9–10.3)
Creatinine, Ser: 0.69 mg/dL (ref 0.61–1.24)
GFR calc Af Amer: >60 mL/min (ref >60)
GFR calc non Af Amer: >60 mL/min (ref >60)
GLUCOSE: 156 mg/dL — AB (ref 65–99)
POTASSIUM: 4.3 mmol/L (ref 3.5–5.1)
Sodium: 136 mmol/L (ref 135–145)

## 2017-09-11 LAB — URINALYSIS, ROUTINE W REFLEX MICROSCOPIC
Bacteria, UA: NONE SEEN
Bilirubin Urine: NEGATIVE
Glucose, UA: >=500 mg/dL — AB
HGB URINE DIPSTICK: NEGATIVE
Ketones, ur: NEGATIVE mg/dL
LEUKOCYTES UA: NEGATIVE
Nitrite: NEGATIVE
Protein, ur: NEGATIVE mg/dL
Specific Gravity, Urine: 1.025 (ref 1.005–1.030)
Squamous Epithelial / LPF: NONE SEEN
pH: 5 (ref 5.0–8.0)

## 2017-09-11 LAB — CBC WITH DIFFERENTIAL/PLATELET
Basophils Absolute: 0.1 10*3/uL (ref 0.0–0.1)
Basophils Relative: 1 %
EOS PCT: 3 %
Eosinophils Absolute: 0.2 10*3/uL (ref 0.0–0.7)
HEMATOCRIT: 43.5 % (ref 39.0–52.0)
HEMOGLOBIN: 15.2 g/dL (ref 13.0–17.0)
LYMPHS ABS: 3.1 10*3/uL (ref 0.7–4.0)
LYMPHS PCT: 52 %
MCH: 31.1 pg (ref 26.0–34.0)
MCHC: 34.9 g/dL (ref 30.0–36.0)
MCV: 89.1 fL (ref 78.0–100.0)
Monocytes Absolute: 0.6 10*3/uL (ref 0.1–1.0)
Monocytes Relative: 10 %
Neutro Abs: 2.1 10*3/uL (ref 1.7–7.7)
Neutrophils Relative %: 34 %
PLATELETS: 189 10*3/uL (ref 150–400)
RBC: 4.88 MIL/uL (ref 4.22–5.81)
RDW: 13.4 % (ref 11.5–15.5)
WBC: 6 10*3/uL (ref 4.0–10.5)

## 2017-09-11 MED ORDER — ACETAMINOPHEN 325 MG PO TABS
650.0000 mg | ORAL_TABLET | Freq: Once | ORAL | Status: AC
  Administered 2017-09-11: 650 mg via ORAL
  Filled 2017-09-11: qty 2

## 2017-09-11 MED ORDER — OXYCODONE HCL 5 MG PO TABS: 5.0000 mg | ORAL_TABLET | Freq: Four times a day (QID) | ORAL | 0 refills | Status: AC | PRN

## 2017-09-11 MED ORDER — IBUPROFEN 800 MG PO TABS: 800.0000 mg | ORAL_TABLET | Freq: Four times a day (QID) | ORAL | 0 refills | Status: DC | PRN

## 2017-09-11 MED ORDER — ACETAMINOPHEN 500 MG PO TABS: 1000.0000 mg | ORAL_TABLET | Freq: Four times a day (QID) | ORAL | 0 refills | Status: AC | PRN

## 2017-09-11 MED ORDER — OXYCODONE-ACETAMINOPHEN 5-325 MG PO TABS
1.0000 | ORAL_TABLET | Freq: Once | ORAL | Status: AC
  Administered 2017-09-11: 1 via ORAL
  Filled 2017-09-11: qty 1

## 2017-09-11 MED ORDER — METHOCARBAMOL 500 MG PO TABS: 1000.0000 mg | ORAL_TABLET | Freq: Four times a day (QID) | ORAL | 0 refills | Status: AC | PRN

## 2017-09-11 MED ORDER — DEXTROSE 5 % IV SOLN
1000.0000 mg | Freq: Once | INTRAVENOUS | Status: AC
  Administered 2017-09-11: 1000 mg via INTRAVENOUS
  Filled 2017-09-11: qty 10

## 2017-09-11 MED ORDER — METHOCARBAMOL 1000 MG/10ML IJ SOLN: 1000.0000 mg | Freq: Once | INTRAMUSCULAR | Status: DC

## 2017-09-11 NOTE — ED Triage Notes (Signed)
Patient c/o right lower back pain radiating to flank. Seen yesterday for same, reports taking tylenol and ibuprofen without relief. Denies urinary sx. States 'this is not a muscle strain." Ambulatory. Reports discomfort sitting and lying.

## 2017-09-11 NOTE — Discharge Instructions (Signed)
Labs, urine, x-rays looked ok today. I suspect your pain may be from sacroiliac joint inflammation or other musculoskeletal problem.  Follow up with orthopedist in 1 week for re-evaluation. Return for fevers, abdominal pain, nausea, vomiting, urinary problems, leg numbness or weakness.   For pain take the following medication regimen: 1000 mg acetaminophen (tylenol) every 6 hours 800 mg ibuprofen (aleve, advil, etc) every 6 hours 1000 mg robaxin (muscle relaxer) every 6 hours 5 mg oxycodone every 6 hours Any over the counter lidocaine patch can be applied to the area of pain (salonpas) Heat   Oxycodone is a narcotic pain medication that has risk of overdose, death, dependence and abuse. Mild and expected side effects include nausea, stomach upset, drowsiness, constipation. Do not consume alcohol, drive or use heavy machinery while taking this medication. Do not leave unattended around children. Flush any remaining pills that you do not use and do not share.  The emergency department has a strict policy regarding prescription of narcotic medications. We prescribe a short course for acute, new pain or injuries. We are unable to refill this medication in the emergency department for chronic pain or repeatedly.  Refill need to be done by specialist or primary care provider or pain clinic.  Contact your primary care provider or specialist for chronic pain management and refill on narcotic medications.

## 2017-09-11 NOTE — ED Provider Notes (Signed)
[facility] DEPT Provider Note   CSN: [id number] Arrival date & time: 09/11/17  1143     History   Chief Complaint Chief Complaint  Patient presents with  . Back Pain    HPI Jeffrey [name] is a 52 y.o. male history of chronic neck pain, chronic back pain, diabetes, hypertension, hyperlipidemia here for evaluation of deep, stabbing, constant, nonradiating pain to the right lower back for 4 days. First noticed pain when he was getting out of bed. Pain seems to improve during the day and as he walks around however in the last 24 hours pain has been persistent. Pain is alleviated when he crosses his legs which relieves the stabbing pain. Has taken muscle relaxer and ibuprofen with minimal relief. Was seen in the ED yesterday for the same and given 1 Vicodin which she took and provided significant relief.  He denies falls, trauma, previous surgeries.  This pain is different than his chronic back pain. He denies fevers, chills, chest pain, shortness of breath, cough, abdominal pain, nausea, vomiting, dysuria, hematuria, frequency, diarrhea, constipation, saddle anesthesia, bladder/bowel incontinence or retention. No numbness or weakness distally. No IV drug use.  HPI  Past Medical History:  Diagnosis Date  . Allergy   . ANXIETY 07/05/2007  . Arthritis 08/2015   neck and back  . Cancer ([place])    basal cell  . CARPAL TUNNEL SYNDROME, BILATERAL 07/05/2007  . Cervicalgia 03/18/2008  . DEPRESSION 07/05/2007  . DIABETES MELLITUS, TYPE II 07/05/2007  . ERECTILE DYSFUNCTION 07/11/2007  . GERD 07/11/2007  . HYPERLIPIDEMIA 07/05/2007  . HYPERTENSION 07/05/2007  . LOW BACK PAIN 07/11/2007  . Meralgia paresthetica 09/25/2008  . PEPTIC ULCER DISEASE 07/05/2007  . SINUSITIS- ACUTE-NOS 03/26/2009  . SKIN LESION 03/18/2008    Patient Active Problem List   Diagnosis Date Noted  . Finger, superficial foreign body (splinter), initial encounter 08/23/2016  . Skin  lesion 08/23/2016  . Vitamin D deficiency 02/24/2016  . Labral tear of long head of left biceps tendon 01/22/2016  . Polyarthralgia 01/22/2016  . Cervical disc disorder with radiculopathy of cervical region 09/07/2015  . Shoulder bursitis 09/07/2015  . Left shoulder pain 08/18/2015  . Polycythemia, secondary 08/30/2013  . Allergic rhinitis, cause unspecified 08/30/2013  . Abnormal urine color 05/24/2013  . Right flank pain 05/24/2013  . Skin cancer, basal cell 12/27/2010  . Right shoulder pain 12/27/2010  . Right hand pain 12/27/2010  . Preventative health care 12/26/2010  . Meralgia paresthetica 09/25/2008  . Cervicalgia 03/18/2008  . ERECTILE DYSFUNCTION 07/11/2007  . GERD 07/11/2007  . LOW BACK PAIN 07/11/2007  . Diabetes ([place]) 07/05/2007  . Hyperlipidemia 07/05/2007  . ANXIETY 07/05/2007  . DEPRESSION 07/05/2007  . CARPAL TUNNEL SYNDROME, BILATERAL 07/05/2007  . Essential hypertension 07/05/2007  . PEPTIC ULCER DISEASE 07/05/2007    Past Surgical History:  Procedure Laterality Date  . BASAL CELL CARCINOMA EXCISION Right 2011   shoulder area  . left wrist surgury/fracture  1985  . SHOULDER ARTHROSCOPY WITH SUBACROMIAL DECOMPRESSION Left 03/30/2017   Procedure: SHOULDER ARTHROSCOPY WITH DEBRIDEMENT ROTATOR CUFF,SUBACROMIAL DECOMPRESSION AND OPEN TENODESIS;  Surgeon: [name], MD;  Location: [facility];  Service: Orthopedics;  Laterality: Left;  SHOULDER ARTHROSCOPY WITH DEBRIDEMENT ROTATOR CUFF,SUBACROMIAL DECOMPRESSION AND POSSIBLE OPEN TENODESIS  . WISDOM TOOTH EXTRACTION         Home Medications    Prior to Admission medications   Medication Sig Start Date End Date Taking? Authorizing Provider  ALPRAZolam Duanne Moron) 1 MG tablet  Take 1 tablet (1 mg total) by mouth 2 (two) times daily as needed. Patient taking differently: Take 1 mg by mouth 2 (two) times daily as needed for anxiety.  08/25/17  Yes Biagio Borg, MD  aspirin EC 81 MG tablet Take 81 mg by mouth every  evening.   Yes [provider]  atorvastatin (LIPITOR) 40 MG tablet Take 1 tablet (40 mg total) by mouth daily. 08/25/17  Yes Biagio Borg, MD  carisoprodol (SOMA) 350 MG tablet 1 tab by mouth daily as needed Patient taking differently: Take 350 mg by mouth daily as needed for muscle spasms. 1 tab by mouth daily as needed 08/25/17  Yes Biagio Borg, MD  gabapentin (NEURONTIN) 300 MG capsule Take 1 capsule (300 mg total) 4 (four) times daily by mouth. 06/23/17  Yes Lyndal Pulley, DO  glipiZIDE (GLUCOTROL XL) 10 MG 24 hr tablet 1 and 1/2 tab by mouth daily 08/25/17  Yes Biagio Borg, MD  lisinopril (PRINIVIL,ZESTRIL) 40 MG tablet Take 1 tablet (40 mg total) by mouth daily. 08/25/17  Yes Biagio Borg, MD  metFORMIN (GLUCOPHAGE) 500 MG tablet TAKE 2 TABLETS BY MOUTH EVERY MORNING AND TAKE 2 TABLETS BY MOUTH EVERY EVENING 08/25/17  Yes Biagio Borg, MD  pioglitazone (ACTOS) 45 MG tablet Take 1 tablet (45 mg total) by mouth daily. 08/25/17  Yes Biagio Borg, MD  acetaminophen (TYLENOL) 500 MG tablet Take 2 tablets (1,000 mg total) by mouth every 6 (six) hours as needed for up to 3 days. 09/11/17 09/14/17  Kinnie Feil, PA-C  ibuprofen (ADVIL,MOTRIN) 800 MG tablet Take 1 tablet (800 mg total) by mouth every 6 (six) hours as needed. 09/11/17   Kinnie Feil, PA-C  methocarbamol (ROBAXIN) 500 MG tablet Take 2 tablets (1,000 mg total) by mouth every 6 (six) hours as needed for up to 3 days for muscle spasms. 09/11/17 09/14/17  Kinnie Feil, PA-C  oxyCODONE (ROXICODONE) 5 MG immediate release tablet Take 1 tablet (5 mg total) by mouth every 6 (six) hours as needed for up to 3 days for severe pain. 09/11/17 09/14/17  Kinnie Feil, PA-C    Family History Family History  Problem Relation Age of Onset  . Diabetes Mother   . Hypertension Mother   . Diabetes Father   . Hypertension Father   . Heart disease Maternal Grandmother   . Cancer Maternal Grandfather        colon cancer  .  Diabetes Other   . Hypertension Other   . Colon cancer Neg Hx     Social History Social History   Tobacco Use  . Smoking status: Former Smoker    Types: Cigars    Last attempt to quit: 02/17/2014    Years since quitting: 3.5  . Smokeless tobacco: Current User  . Tobacco comment: ecigs  Substance Use Topics  . Alcohol use: Yes    Alcohol/week: 0.0 oz    Comment: social  . Drug use: No     Allergies   Codeine   Review of Systems Review of Systems  Musculoskeletal: Positive for back pain.  All other systems reviewed and are negative.    Physical Exam Updated Vital Signs BP 114/76 (BP Location: Left Arm)   Pulse 75   Temp 98.3 F (36.8 C)   Resp 18   Ht 5\' 11"  (1.803 m)   Wt 122.5 kg (270 lb)   SpO2 97%   BMI 37.66 kg/m  Physical Exam  Constitutional: He appears well-developed and well-nourished. No distress.  HENT:  Head: Normocephalic and atraumatic.  Nose: Nose normal.  Eyes: EOM are normal.  Cardiovascular: Normal rate, S1 normal, S2 normal and normal heart sounds.  Pulses:      Radial pulses are 2+ on the right side, and 2+ on the left side.       Dorsalis pedis pulses are 2+ on the right side, and 2+ on the left side.  Pulmonary/Chest: Effort normal and breath sounds normal. He has no decreased breath sounds. He exhibits no tenderness.  Abdominal: Soft. Normal appearance and bowel sounds are normal. There is no tenderness.  No suprapubic or CVA tenderness   Musculoskeletal: He exhibits tenderness.       Lumbar back: He exhibits tenderness and pain.  Normal gait No T spine midline tenderness No L spine midline tenderness No T spine paraspinal muscular tenderness or increased tone  Full AROM of TL spine without reported pain +TTP to right SI joint and right paraspinal lumbar muscles, no obvious spasm Full PROM hip bilaterally without reported pain Negative SLR. Negative Faber. Negative Stinchfield test.   Neurological:  5/5 strength with  flexion/extension of hip, knee and ankle, bilaterally.  Sensation to light touch intact in lower extremities including feet  Skin: Skin is warm and dry. Capillary refill takes less than 2 seconds.  Psychiatric: He has a normal mood and affect. His behavior is normal. Judgment and thought content normal.     ED Treatments / Results  Labs (all labs ordered are listed, but only abnormal results are displayed) Labs Reviewed  URINALYSIS, ROUTINE W REFLEX MICROSCOPIC - Abnormal; Notable for the following components:      Result Value   Glucose, UA >=500 (*)    All other components within normal limits  BASIC METABOLIC PANEL - Abnormal; Notable for the following components:   Glucose, Bld 156 (*)    All other components within normal limits  CBC WITH DIFFERENTIAL/PLATELET    EKG  EKG Interpretation None       Radiology Dg Lumbar Spine Complete  Result Date: 09/11/2017 CLINICAL DATA:  Increasing posterior right hip and low back pain for 4 days. No known injury EXAM: LUMBAR SPINE - COMPLETE 4+ VIEW COMPARISON:  Abdominopelvic CT 05/28/2013. FINDINGS: Five lumbar type vertebral bodies. The alignment is normal. Chronic limbus vertebra involving the superior endplate of L4 with associated mild disc space narrowing at L3-4. No evidence of acute fracture or pars defect. Mild facet degenerative changes are present inferiorly. IMPRESSION: No acute osseous findings or malalignment. Stable spondylosis, greatest at L3-4. Electronically Signed   By: Richardean Sale M.D.   On: 09/11/2017 16:02   Dg Hip Unilat W Or Wo Pelvis 2-3 Views Right  Result Date: 09/11/2017 CLINICAL DATA:  Increasing posterior right hip and low back pain for 4 days. No known injury. EXAM: DG HIP (WITH OR WITHOUT PELVIS) 2-3V RIGHT COMPARISON:  None. FINDINGS: The mineralization and alignment are normal. There is no evidence of acute fracture or dislocation. No evidence of femoral head avascular necrosis. The hip joint spaces are  maintained. The sacroiliac joints appear normal. IMPRESSION: No acute osseous findings or significant arthropathic changes. Electronically Signed   By: Richardean Sale M.D.   On: 09/11/2017 16:00    Procedures Procedures (including critical care time)  Medications Ordered in ED Medications  oxyCODONE-acetaminophen (PERCOCET/ROXICET) 5-325 MG per tablet 1 tablet (1 tablet Oral Given 09/11/17 1600)  acetaminophen (TYLENOL) tablet 650  mg (650 mg Oral Given 09/11/17 1600)  methocarbamol (ROBAXIN) 1,000 mg in dextrose 5 % 50 mL IVPB (1,000 mg Intravenous New Bag/Given 09/11/17 1622)     Initial Impression / Assessment and Plan / ED Course  I have reviewed the triage vital signs and the nursing notes.  Pertinent labs & imaging results that were available during my care of the patient were reviewed by me and considered in my medical decision making (see chart for details).     No red flag symptoms of back pain including: bladder/bowel incontinence or retention, night sweats, night pain, fevers, unexpected weight loss, h/o cancer, IVDU, recent trauma or falls.  Exam reassuring he has focal right SI joint and right paraspinal lumbar muscle tenderness. No neuro deficits, abdominal pain or CVAT. Given age and second ED visit for same in 2 days will get screening labs and imaging.   Final Clinical Impressions(s) / ED Diagnoses   CBC, BMP, U/A normal. Lumbar and right hip x-ray w/o acute injuries. Re-evaluated pt after robaxin IV and analgesia and he had significant relief of pain. He is ambulatory w/o pain. Will d/c with pain control and f/u with orthopedist in 1 week. High suspicion for MSK, SI joint dysfunction. Doubt AAA, cauda equina, epidural abscess, kidney stone, pyelo. He is followed by neurosurgery for chronic neck pain. Discussed return precautions.  Final diagnoses:  Sacroiliac joint pain    ED Discharge Orders        Ordered    oxyCODONE (ROXICODONE) 5 MG immediate release tablet  Every  6 hours PRN     09/11/17 1707    methocarbamol (ROBAXIN) 500 MG tablet  Every 6 hours PRN     09/11/17 1707    acetaminophen (TYLENOL) 500 MG tablet  Every 6 hours PRN     09/11/17 1707    ibuprofen (ADVIL,MOTRIN) 800 MG tablet  Every 6 hours PRN     09/11/17 1707       Kinnie Feil, PA-C 09/11/17 1716    Davonna Belling, MD 09/11/17 2315

## 2017-09-25 NOTE — Progress Notes (Signed)
[name] [address], [place] [address] Phone: [phone] Subjective:    I'm seeing this patient by the request  of:    CC: Neck pain follow-up  [id number]  Jeffrey [name] is a 52 y.o. male coming in with complaint of neck pain.  Was having radicular symptoms and started on gabapentin.  Continues to have discomfort and pain.-Have advanced imaging.  MRI of the spine was done in November that was independently visualized by me.  MRI showed a C4-5 large right subarticular and foraminal disc osteophyte causing severe right neuronal impingement.  Patient never seemed to write back.  Patient states   Patient is still having more of a low back pain.  Has been seen in the emergency room twice.  Did have x-rays of the lower spine taken on September 11, 2017.  Independently visualized by me.  Mild to moderate disc space narrowing from L3 through L4 but otherwise fairly unremarkable.  Show that patient did have significant amount of glucose in his urine  10 days a go went to emergency room. Was told he pulled his [place] joint. Gave him muscle relaxers and pain meds. Pain is worse in the morning. Movement makes it better. Sleeping makes it worse. Lower back closer to the hip. Initially thought it was lower back pain.   Past Medical History:  Diagnosis Date  . Allergy   . ANXIETY 07/05/2007  . Arthritis 08/2015   neck and back  . Cancer ([place])    basal cell  . CARPAL TUNNEL SYNDROME, BILATERAL 07/05/2007  . Cervicalgia 03/18/2008  . DEPRESSION 07/05/2007  . DIABETES MELLITUS, TYPE II 07/05/2007  . ERECTILE DYSFUNCTION 07/11/2007  . GERD 07/11/2007  . HYPERLIPIDEMIA 07/05/2007  . HYPERTENSION 07/05/2007  . LOW BACK PAIN 07/11/2007  . Meralgia paresthetica 09/25/2008  . PEPTIC ULCER DISEASE 07/05/2007  . SINUSITIS- ACUTE-NOS 03/26/2009  . SKIN LESION 03/18/2008   Past Surgical History:  Procedure Laterality Date  . BASAL CELL CARCINOMA EXCISION Right  2011   shoulder area  . left wrist surgury/fracture  1985  . SHOULDER ARTHROSCOPY WITH SUBACROMIAL DECOMPRESSION Left 03/30/2017   Procedure: SHOULDER ARTHROSCOPY WITH DEBRIDEMENT ROTATOR CUFF,SUBACROMIAL DECOMPRESSION AND OPEN TENODESIS;  Surgeon: [name], MD;  Location: [facility];  Service: Orthopedics;  Laterality: Left;  SHOULDER ARTHROSCOPY WITH DEBRIDEMENT ROTATOR CUFF,SUBACROMIAL DECOMPRESSION AND POSSIBLE OPEN TENODESIS  . WISDOM TOOTH EXTRACTION     Social History   Socioeconomic History  . Marital status: Married    Spouse name: Not on file  . Number of children: 2  . Years of education: Not on file  . Highest education level: Not on file  Social Needs  . Financial resource strain: Not on file  . Food insecurity - worry: Not on file  . Food insecurity - inability: Not on file  . Transportation needs - medical: Not on file  . Transportation needs - non-medical: Not on file  Occupational History  . Occupation: Librarian, academic for [employer]  Tobacco Use  . Smoking status: Former Smoker    Types: Cigars    Last attempt to quit: 02/17/2014    Years since quitting: 3.6  . Smokeless tobacco: Current User  . Tobacco comment: ecigs  Substance and Sexual Activity  . Alcohol use: Yes    Alcohol/week: 0.0 oz    Comment: social  . Drug use: No  . Sexual activity: Not on file  Other Topics Concern  . Not on file  Social History Narrative  . Not on file   Allergies  Allergen Reactions  . Codeine Itching   Family History  Problem Relation Age of Onset  . Diabetes Mother   . Hypertension Mother   . Diabetes Father   . Hypertension Father   . Heart disease Maternal Grandmother   . Cancer Maternal Grandfather        colon cancer  . Diabetes Other   . Hypertension Other   . Colon cancer Neg Hx      Past medical history, social, surgical and family history all reviewed in electronic medical record.  No pertanent information unless stated  regarding to the chief complaint.   Review of Systems:Review of systems updated and as accurate as of 09/25/17  No headache, visual changes, nausea, vomiting, diarrhea, constipation, dizziness, abdominal pain, skin rash, fevers, chills, night sweats, weight loss, swollen lymph nodes, body aches, joint swelling, muscle aches, chest pain, shortness of breath, mood changes.   Objective  There were no vitals taken for this visit. Systems examined below as of 09/25/17   General: No apparent distress alert and oriented x3 mood and affect normal, dressed appropriately.  HEENT: Pupils equal, extraocular movements intact  Respiratory: Patient's speak in full sentences and does not appear short of breath  Cardiovascular: No lower extremity edema, non tender, no erythema  Skin: Warm dry intact with no signs of infection or rash on extremities or on axial skeleton.  Abdomen: Soft nontender  Neuro: Cranial nerves II through XII are intact, neurovascularly intact in all extremities with 2+ DTRs and 2+ pulses.  Lymph: No lymphadenopathy of posterior or anterior cervical chain or axillae bilaterally.  Gait normal with good balance and coordination.  MSK:  Non tender with full range of motion and good stability and symmetric strength and tone of shoulders, elbows, wrist, hip, knee and ankles bilaterally.  Back Exam:  Inspection: Loss of lordosis Motion: Flexion 20 deg, Extension 25 deg, Side Bending to 35 deg bilaterally,  Rotation to 35 deg bilaterally  SLR laying: Negative  XSLR laying: Negative  Palpable tenderness: Tender to palpation of the right sacroiliac joint.Marland Kitchen FABER: Mild positive Corky Sox. Sensory change: Gross sensation intact to all lumbar and sacral dermatomes.  Reflexes: 2+ at both patellar tendons, 2+ at achilles tendons, Babinski's downgoing.  Strength at foot  Plantar-flexion: 5/5 Dorsi-flexion: 5/5 Eversion: 5/5 Inversion: 5/5  Leg strength  Quad: 5/5 Hamstring: 5/5 Hip flexor: 5/5 Hip  abductors: 5/5  Gait unremarkable.   Impression and Recommendations:     This case required medical decision making of moderate complexity.      Note: This dictation was prepared with Dragon dictation along with smaller phrase technology. Any transcriptional errors that result from this process are unintentional.

## 2017-09-26 ENCOUNTER — Encounter: Payer: Self-pay | Admitting: Family Medicine

## 2017-09-26 ENCOUNTER — Ambulatory Visit: Payer: 59 | Admitting: Family Medicine

## 2017-09-26 DIAGNOSIS — G8929 Other chronic pain: Secondary | ICD-10-CM

## 2017-09-26 DIAGNOSIS — M5441 Lumbago with sciatica, right side: Secondary | ICD-10-CM

## 2017-09-26 MED ORDER — VENLAFAXINE HCL ER 37.5 MG PO CP24: 37.5000 mg | ORAL_CAPSULE | Freq: Every day | ORAL | 1 refills | Status: DC

## 2017-09-26 NOTE — Patient Instructions (Signed)
Good to see you  I think it is more piriformis syndrome.  Ice 20 minutes 2 times daily. Usually after activity and before bed. Tennis ball in back right pocket with a lot of sittinge.  Effexor 37.5 mg daily to help with nerve pain.  Exercises 3 times a week.   See me again in 3 weeks

## 2017-09-26 NOTE — Assessment & Plan Note (Signed)
Low back pain.  Started on Effexor low dose for more of the radicular symptoms.  Patient given home exercises that I think will be beneficial.  We discussed icing regimen.  Discussed the possibility of advanced imaging would and possible injections.  Patient though feels like he is making some improvement at this time.  Declined formal physical therapy.  Follow-up again in 3-4 weeks.

## 2017-09-30 ENCOUNTER — Telehealth: Payer: 59 | Admitting: Nurse Practitioner

## 2017-09-30 DIAGNOSIS — J111 Influenza due to unidentified influenza virus with other respiratory manifestations: Secondary | ICD-10-CM

## 2017-09-30 MED ORDER — OSELTAMIVIR PHOSPHATE 75 MG PO CAPS: 75.0000 mg | ORAL_CAPSULE | Freq: Two times a day (BID) | ORAL | 0 refills | Status: DC

## 2017-09-30 NOTE — Progress Notes (Signed)

## 2017-10-15 NOTE — Progress Notes (Signed)
[name] [address] [place], [place] [address] Phone: [phone] Subjective:    I'm seeing this patient by the request  of:    CC: Low back pain.  [id number]  BARON Jeffrey [name] is a 52 y.o. male coming in with complaint of low back pain.  Was found to have mild osteoarthritic changes.  Feels that he is feeling 80% better on the Effexor and the gabapentin.  Patient is doing much better at this time.  Some tightness in the morning but that is it.     Past Medical History:  Diagnosis Date  . Allergy   . ANXIETY 07/05/2007  . Arthritis 08/2015   neck and back  . Cancer ([place])    basal cell  . CARPAL TUNNEL SYNDROME, BILATERAL 07/05/2007  . Cervicalgia 03/18/2008  . DEPRESSION 07/05/2007  . DIABETES MELLITUS, TYPE II 07/05/2007  . ERECTILE DYSFUNCTION 07/11/2007  . GERD 07/11/2007  . HYPERLIPIDEMIA 07/05/2007  . HYPERTENSION 07/05/2007  . LOW BACK PAIN 07/11/2007  . Meralgia paresthetica 09/25/2008  . PEPTIC ULCER DISEASE 07/05/2007  . SINUSITIS- ACUTE-NOS 03/26/2009  . SKIN LESION 03/18/2008   Past Surgical History:  Procedure Laterality Date  . BASAL CELL CARCINOMA EXCISION Right 2011   shoulder area  . left wrist surgury/fracture  1985  . SHOULDER ARTHROSCOPY WITH SUBACROMIAL DECOMPRESSION Left 03/30/2017   Procedure: SHOULDER ARTHROSCOPY WITH DEBRIDEMENT ROTATOR CUFF,SUBACROMIAL DECOMPRESSION AND OPEN TENODESIS;  Surgeon: [name], MD;  Location: [facility];  Service: Orthopedics;  Laterality: Left;  SHOULDER ARTHROSCOPY WITH DEBRIDEMENT ROTATOR CUFF,SUBACROMIAL DECOMPRESSION AND POSSIBLE OPEN TENODESIS  . WISDOM TOOTH EXTRACTION     Social History   Socioeconomic History  . Marital status: Married    Spouse name: None  . Number of children: 2  . Years of education: None  . Highest education level: None  Social Needs  . Financial resource strain: None  . Food insecurity - worry: None  . Food insecurity - inability: None  .  Transportation needs - medical: None  . Transportation needs - non-medical: None  Occupational History  . Occupation: Librarian, academic for [employer]  Tobacco Use  . Smoking status: Former Smoker    Types: Cigars    Last attempt to quit: 02/17/2014    Years since quitting: 3.6  . Smokeless tobacco: Current User  . Tobacco comment: ecigs  Substance and Sexual Activity  . Alcohol use: Yes    Alcohol/week: 0.0 oz    Comment: social  . Drug use: No  . Sexual activity: None  Other Topics Concern  . None  Social History Narrative  . None   Allergies  Allergen Reactions  . Codeine Itching   Family History  Problem Relation Age of Onset  . Diabetes Mother   . Hypertension Mother   . Diabetes Father   . Hypertension Father   . Heart disease Maternal Grandmother   . Cancer Maternal Grandfather        colon cancer  . Diabetes Other   . Hypertension Other   . Colon cancer Neg Hx      Past medical history, social, surgical and family history all reviewed in electronic medical record.  No pertanent information unless stated regarding to the chief complaint.   Review of Systems:Review of systems updated and as accurate as of 10/16/17  No headache, visual changes, nausea, vomiting, diarrhea, constipation, dizziness, abdominal pain, skin rash, fevers, chills, night sweats, weight loss, swollen lymph nodes, body aches,  joint swelling,  chest pain, shortness of breath, mood changes.  Mild positive muscle aches  Objective  Blood pressure 140/74, pulse 89, height 5\' 11"  (1.803 m), weight 269 lb (122 kg), SpO2 98 %. Systems examined below as of 10/16/17   General: No apparent distress alert and oriented x3 mood and affect normal, dressed appropriately.  HEENT: Pupils equal, extraocular movements intact  Respiratory: Patient's speak in full sentences and does not appear short of breath  Cardiovascular: No lower extremity edema, non tender, no erythema  Skin: Warm dry  intact with no signs of infection or rash on extremities or on axial skeleton.  Abdomen: Soft nontender  Neuro: Cranial nerves II through XII are intact, neurovascularly intact in all extremities with 2+ DTRs and 2+ pulses.  Lymph: No lymphadenopathy of posterior or anterior cervical chain or axillae bilaterally.  Gait normal with good balance and coordination.  MSK:  Non tender with full range of motion and good stability and symmetric strength and tone of shoulders, elbows, wrist, hip, knee and ankles bilaterally.  Back Exam:  Inspection: Unremarkable  Motion: Flexion 40 deg, Extension 25 deg, Side Bending to 35 deg bilaterally,  Rotation to 40 deg bilaterally  SLR laying: Negative  XSLR laying: Negative  Palpable tenderness: Mild tenderness over the lumbosacral area. FABER: negative. Sensory change: Gross sensation intact to all lumbar and sacral dermatomes.  Reflexes: 2+ at both patellar tendons, 2+ at achilles tendons, Babinski's downgoing.  Strength at foot  Plantar-flexion: 5/5 Dorsi-flexion: 5/5 Eversion: 5/5 Inversion: 5/5  Leg strength  Quad: 5/5 Hamstring: 5/5 Hip flexor: 5/5 Hip abductors: 4/5 but symmetric Gait unremarkable.   Impression and Recommendations:     This case required medical decision making of moderate complexity.      Note: This dictation was prepared with Dragon dictation along with smaller phrase technology. Any transcriptional errors that result from this process are unintentional.

## 2017-10-16 ENCOUNTER — Encounter: Payer: Self-pay | Admitting: Family Medicine

## 2017-10-16 ENCOUNTER — Ambulatory Visit: Payer: 59 | Admitting: Family Medicine

## 2017-10-16 DIAGNOSIS — M5441 Lumbago with sciatica, right side: Secondary | ICD-10-CM | POA: Diagnosis not present

## 2017-10-16 DIAGNOSIS — G8929 Other chronic pain: Secondary | ICD-10-CM | POA: Diagnosis not present

## 2017-10-16 NOTE — Assessment & Plan Note (Signed)
Very mild arthritis overall.  Discussed with patient in great length about icing regimen and home exercises.  Discussed the Effexor as well.  Patient considered continue the same dose.  Patient wants to be on the least amount of medications all possible.  Follow-up again 2 months

## 2017-10-16 NOTE — Patient Instructions (Signed)
Good to see you  Alvera Singh is your friend.  Keep working on stretching.  Mattress? See me again in 4-6 weeks if not completely better

## 2017-10-23 ENCOUNTER — Other Ambulatory Visit: Payer: Self-pay | Admitting: *Deleted

## 2017-10-23 MED ORDER — VENLAFAXINE HCL ER 37.5 MG PO CP24: 37.5000 mg | ORAL_CAPSULE | Freq: Every day | ORAL | 1 refills | Status: DC

## 2017-10-23 NOTE — Telephone Encounter (Signed)
Refill done.  

## 2018-01-14 ENCOUNTER — Encounter: Payer: Self-pay | Admitting: Family Medicine

## 2018-01-16 ENCOUNTER — Telehealth: Payer: Self-pay | Admitting: Internal Medicine

## 2018-01-16 ENCOUNTER — Other Ambulatory Visit: Payer: Self-pay

## 2018-01-16 MED ORDER — PREDNISONE 50 MG PO TABS: ORAL_TABLET | ORAL | 0 refills | Status: DC

## 2018-01-16 NOTE — Telephone Encounter (Signed)
Copied from [place]. Topic: Quick Communication - See Telephone Encounter >> Jan 16, 2018  1:36 PM Synthia Innocent wrote: CRM for notification. See Telephone encounter for: 01/16/18. Patient states per mychart message Dr Tamala Julian was going to send him in some prednisone. Pharmacy has not received it. [facility] on [address]

## 2018-01-29 NOTE — Progress Notes (Signed)
[name] [address], [place] [address] Phone: [phone] Subjective:     CC: Right neck pain  BDZ:[id number]  Jeffrey [name] is a 52 y.o. male coming in with complaint of right sided neck pain. Pain radiates down the right arm. Second and third fingers are numb. Patient notes that he has a lot of pain in lateral elbow. Patient took prednisone for 5 days. Had immediate relief but pain came back by day 3 of the course. Is using gabapentin 4x a day which is not alleviating his pain.  Seems to be worsening with some mild weakness of the hand.     Past Medical History:  Diagnosis Date  . Allergy   . ANXIETY 07/05/2007  . Arthritis 08/2015   neck and back  . Cancer ([facility])    basal cell  . CARPAL TUNNEL SYNDROME, BILATERAL 07/05/2007  . Cervicalgia 03/18/2008  . DEPRESSION 07/05/2007  . DIABETES MELLITUS, TYPE II 07/05/2007  . ERECTILE DYSFUNCTION 07/11/2007  . GERD 07/11/2007  . HYPERLIPIDEMIA 07/05/2007  . HYPERTENSION 07/05/2007  . LOW BACK PAIN 07/11/2007  . Meralgia paresthetica 09/25/2008  . PEPTIC ULCER DISEASE 07/05/2007  . SINUSITIS- ACUTE-NOS 03/26/2009  . SKIN LESION 03/18/2008   Past Surgical History:  Procedure Laterality Date  . BASAL CELL CARCINOMA EXCISION Right 2011   shoulder area  . left wrist surgury/fracture  1985  . SHOULDER ARTHROSCOPY WITH SUBACROMIAL DECOMPRESSION Left 03/30/2017   Procedure: SHOULDER ARTHROSCOPY WITH DEBRIDEMENT ROTATOR CUFF,SUBACROMIAL DECOMPRESSION AND OPEN TENODESIS;  Surgeon: [name], MD;  Location: [facility];  Service: Orthopedics;  Laterality: Left;  SHOULDER ARTHROSCOPY WITH DEBRIDEMENT ROTATOR CUFF,SUBACROMIAL DECOMPRESSION AND POSSIBLE OPEN TENODESIS  . WISDOM TOOTH EXTRACTION     Social History   Socioeconomic History  . Marital status: Married    Spouse name: Not on file  . Number of children: 2  . Years of education: Not on file  . Highest education level: Not on file    Occupational History  . Occupation: Librarian, academic for [employer]  . Financial resource strain: Not on file  . Food insecurity:    Worry: Not on file    Inability: Not on file  . Transportation needs:    Medical: Not on file    Non-medical: Not on file  Tobacco Use  . Smoking status: Former Smoker    Types: Cigars    Last attempt to quit: 02/17/2014    Years since quitting: 3.9  . Smokeless tobacco: Current User  . Tobacco comment: ecigs  Substance and Sexual Activity  . Alcohol use: Yes    Alcohol/week: 0.0 oz    Comment: social  . Drug use: No  . Sexual activity: Not on file  Lifestyle  . Physical activity:    Days per week: Not on file    Minutes per session: Not on file  . Stress: Not on file  Relationships  . Social connections:    Talks on phone: Not on file    Gets together: Not on file    Attends religious service: Not on file    Active member of club or organization: Not on file    Attends meetings of clubs or organizations: Not on file    Relationship status: Not on file  Other Topics Concern  . Not on file  Social History Narrative  . Not on file   Allergies  Allergen Reactions  . Codeine Itching   Family  History  Problem Relation Age of Onset  . Diabetes Mother   . Hypertension Mother   . Diabetes Father   . Hypertension Father   . Heart disease Maternal Grandmother   . Cancer Maternal Grandfather        colon cancer  . Diabetes Other   . Hypertension Other   . Colon cancer Neg Hx      Past medical history, social, surgical and family history all reviewed in electronic medical record.  No pertanent information unless stated regarding to the chief complaint.   Review of Systems:Review of systems updated and as accurate as of 01/30/18  No  visual changes, nausea, vomiting, diarrhea, constipation, dizziness, abdominal pain, skin rash, fevers, chills, night sweats, weight loss, swollen lymph nodes, body aches,  joint swelling, muscle aches, chest pain, shortness of breath, mood changes.  Positive headache  Objective  Blood pressure 124/80, pulse 97, height 5\' 11"  (1.803 m), weight 275 lb (124.7 kg), SpO2 97 %. Systems examined below as of 01/30/18   General: No apparent distress alert and oriented x3 mood and affect normal, dressed appropriately.  HEENT: Pupils equal, extraocular movements intact  Respiratory: Patient's speak in full sentences and does not appear short of breath  Cardiovascular: No lower extremity edema, non tender, no erythema  Skin: Warm dry intact with no signs of infection or rash on extremities or on axial skeleton.  Abdomen: Soft nontender  Neuro: Cranial nerves II through XII are intact, neurovascularly intact in all extremities with 2+ DTRs and 2+ pulses.  Lymph: No lymphadenopathy of posterior or anterior cervical chain or axillae bilaterally.  Gait normal with good balance and coordination.  MSK:  Non tender with full range of motion and good stability and symmetric strength and tone of shoulders, elbows, wrist, hip, knee and ankles bilaterally.  Neck: Inspection loss of lordosis. No palpable stepoffs. Positive Spurling's maneuver with radicular symptoms in the C5-C6 distribution on the right side. Limited range of motion lacking last 10 degrees of extension in the last 10 degrees of sidebending and rotation to the right 4+ out of 5 strength of the grip strength in C5 compared to contralateral side No sensory change to C4 to T1 Negative Hoffman sign bilaterally Reflexes normal Significant tightness of the right trapezius   Impression and Recommendations:     This case required medical decision making of moderate complexity.      Note: This dictation was prepared with Dragon dictation along with smaller phrase technology. Any transcriptional errors that result from this process are unintentional.

## 2018-01-30 ENCOUNTER — Encounter: Payer: Self-pay | Admitting: Family Medicine

## 2018-01-30 ENCOUNTER — Ambulatory Visit: Payer: 59 | Admitting: Family Medicine

## 2018-01-30 VITALS — BP 124/80 | HR 97 | Ht 71.0 in | Wt 275.0 lb

## 2018-01-30 DIAGNOSIS — M5412 Radiculopathy, cervical region: Secondary | ICD-10-CM | POA: Diagnosis not present

## 2018-01-30 MED ORDER — CARISOPRODOL 350 MG PO TABS: 350.0000 mg | ORAL_TABLET | Freq: Three times a day (TID) | ORAL | 1 refills | Status: DC

## 2018-01-30 NOTE — Assessment & Plan Note (Signed)
Cervical radiculopathy.  Patient has had difficulty with his neck previously but it was on the contralateral side.  Now having weakness since he 5 distribution.  Concerning because patient's MRI previously that was independently visualized by me shows patient did have a fairly large bulging disc with a right-sided nerve impingement.  Likely this is exacerbating the problem.  Patient did not respond to the prednisone that was given to him earlier.  He does have weakness.  Epidural ordered today.  Follow-up again in 2 weeks.  If no significant improvement may need referral to neurosurgery.

## 2018-01-30 NOTE — Patient Instructions (Signed)
Good to see you  Jeffrey [name] is your friend.  We will try an epidural in your neck  Refilled the soma so you had more at the moment, Call [phone] to set up the injection Once you have the injection I would like to see you again  2 weeks after to see how you are doing

## 2018-02-07 ENCOUNTER — Encounter: Payer: Self-pay | Admitting: Family Medicine

## 2018-02-08 ENCOUNTER — Other Ambulatory Visit: Payer: Self-pay | Admitting: Family Medicine

## 2018-02-08 NOTE — Telephone Encounter (Signed)
Refill done.  

## 2018-02-09 ENCOUNTER — Ambulatory Visit
Admission: RE | Admit: 2018-02-09 | Discharge: 2018-02-09 | Disposition: A | Discharge status: Home / Self Care | Payer: 59 | Source: Ambulatory Visit | Attending: Family Medicine | Admitting: Family Medicine

## 2018-02-09 DIAGNOSIS — M5412 Radiculopathy, cervical region: Secondary | ICD-10-CM

## 2018-02-09 MED ORDER — IOPAMIDOL (ISOVUE-M 300) INJECTION 61%
1.0000 mL | Freq: Once | INTRAMUSCULAR | Status: AC | PRN
  Administered 2018-02-09: 1 mL via EPIDURAL

## 2018-02-09 MED ORDER — TRIAMCINOLONE ACETONIDE 40 MG/ML IJ SUSP (RADIOLOGY)
60.0000 mg | Freq: Once | INTRAMUSCULAR | Status: AC
  Administered 2018-02-09: 60 mg via EPIDURAL

## 2018-02-09 NOTE — Discharge Instructions (Signed)

## 2018-02-20 ENCOUNTER — Other Ambulatory Visit (INDEPENDENT_AMBULATORY_CARE_PROVIDER_SITE_OTHER): Payer: 59

## 2018-02-20 DIAGNOSIS — E119 Type 2 diabetes mellitus without complications: Secondary | ICD-10-CM | POA: Diagnosis not present

## 2018-02-20 LAB — HEPATIC FUNCTION PANEL
ALBUMIN: 4.3 g/dL (ref 3.5–5.2)
ALT: 19 U/L (ref 0–53)
AST: 14 U/L (ref 0–37)
Alkaline Phosphatase: 46 U/L (ref 39–117)
Bilirubin, Direct: 0.1 mg/dL (ref 0.0–0.3)
TOTAL PROTEIN: 6.6 g/dL (ref 6.0–8.3)
Total Bilirubin: 0.5 mg/dL (ref 0.2–1.2)

## 2018-02-20 LAB — LIPID PANEL
CHOLESTEROL: 126 mg/dL (ref 0–200)
HDL: 36 mg/dL — AB (ref >39.00)
LDL CALC: 67 mg/dL (ref 0–99)
NonHDL: 89.53
TRIGLYCERIDES: 115 mg/dL (ref 0.0–149.0)
Total CHOL/HDL Ratio: 3
VLDL: 23 mg/dL (ref 0.0–40.0)

## 2018-02-20 LAB — BASIC METABOLIC PANEL
BUN: 11 mg/dL (ref 6–23)
CHLORIDE: 102 meq/L (ref 96–112)
CO2: 30 mEq/L (ref 19–32)
Calcium: 9.1 mg/dL (ref 8.4–10.5)
Creatinine, Ser: 0.78 mg/dL (ref 0.40–1.50)
GFR: 110.23 mL/min (ref >60.00)
Glucose, Bld: 106 mg/dL — ABNORMAL HIGH (ref 70–99)
Potassium: 4.4 mEq/L (ref 3.5–5.1)
Sodium: 136 mEq/L (ref 135–145)

## 2018-02-20 LAB — HEMOGLOBIN A1C: Hgb A1c MFr Bld: 6.8 % — ABNORMAL HIGH (ref 4.6–6.5)

## 2018-02-23 ENCOUNTER — Ambulatory Visit: Payer: 59 | Admitting: Internal Medicine

## 2018-02-23 ENCOUNTER — Encounter: Payer: Self-pay | Admitting: Internal Medicine

## 2018-02-23 ENCOUNTER — Other Ambulatory Visit: Payer: Self-pay

## 2018-02-23 ENCOUNTER — Encounter: Payer: Self-pay | Admitting: Family Medicine

## 2018-02-23 ENCOUNTER — Ambulatory Visit: Payer: 59 | Admitting: Family Medicine

## 2018-02-23 VITALS — BP 110/78 | HR 86 | Ht 71.0 in | Wt 274.0 lb

## 2018-02-23 DIAGNOSIS — M5412 Radiculopathy, cervical region: Secondary | ICD-10-CM

## 2018-02-23 DIAGNOSIS — E785 Hyperlipidemia, unspecified: Secondary | ICD-10-CM | POA: Diagnosis not present

## 2018-02-23 DIAGNOSIS — Z0000 Encounter for general adult medical examination without abnormal findings: Secondary | ICD-10-CM | POA: Diagnosis not present

## 2018-02-23 DIAGNOSIS — E119 Type 2 diabetes mellitus without complications: Secondary | ICD-10-CM | POA: Diagnosis not present

## 2018-02-23 DIAGNOSIS — I10 Essential (primary) hypertension: Secondary | ICD-10-CM

## 2018-02-23 DIAGNOSIS — M542 Cervicalgia: Secondary | ICD-10-CM

## 2018-02-23 MED ORDER — METFORMIN HCL 500 MG PO TABS: ORAL_TABLET | ORAL | 3 refills | Status: DC

## 2018-02-23 MED ORDER — LISINOPRIL 40 MG PO TABS: 40.0000 mg | ORAL_TABLET | Freq: Every day | ORAL | 3 refills | Status: DC

## 2018-02-23 MED ORDER — PIOGLITAZONE HCL 45 MG PO TABS: 45.0000 mg | ORAL_TABLET | Freq: Every day | ORAL | 3 refills | Status: DC

## 2018-02-23 MED ORDER — ATORVASTATIN CALCIUM 40 MG PO TABS: 40.0000 mg | ORAL_TABLET | Freq: Every day | ORAL | 3 refills | Status: DC

## 2018-02-23 MED ORDER — ALPRAZOLAM 1 MG PO TABS: 1.0000 mg | ORAL_TABLET | Freq: Two times a day (BID) | ORAL | 5 refills | Status: DC | PRN

## 2018-02-23 MED ORDER — CARISOPRODOL 350 MG PO TABS: 350.0000 mg | ORAL_TABLET | Freq: Every day | ORAL | 1 refills | Status: DC

## 2018-02-23 MED ORDER — GLIPIZIDE ER 10 MG PO TB24: ORAL_TABLET | ORAL | 3 refills | Status: DC

## 2018-02-23 NOTE — Patient Instructions (Signed)
Good to see you  Jeffrey [name] is your friend.  We will order another injection just in case and schedule it in 2-3 weeks from now, if you continue to improve then put it off longer and longer.  COntoinue all vitamins and medicines Exercises 3 times a week.  Keep hands within peripheral vision  See me again at least in next 2 months or if you do the injection 2-3 weeks after

## 2018-02-23 NOTE — Assessment & Plan Note (Signed)
Still having some radicular symptoms.  Strength seems to be somewhat improved though from previous exam.  Concerned though overall.  Discussed which activities to do which wants to avoid.  Patient encouraged him potentially try another epidural.  Patient given home exercises today as well to try to strengthen the upper back.  Encourage patient to finish up with physical therapy.  Continue gabapentin put on low-dose Effexor.  Follow-up 4 weeks

## 2018-02-23 NOTE — Assessment & Plan Note (Signed)
stable overall by history and exam, recent data reviewed with pt, and pt to continue medical treatment as before,  to f/u any worsening symptoms or concerns BP Readings from Last 3 Encounters:  02/23/18 110/78  02/23/18 110/78  01/30/18 124/80

## 2018-02-23 NOTE — Patient Instructions (Addendum)
Please continue all other medications as before, and refills have been done if requested.  Please have the pharmacy call with any other refills you may need.  Please continue your efforts at being more active, low cholesterol diet, and weight control.  You are otherwise up to date with prevention measures today.  Please keep your appointments with your specialists as you may have planned  Please return in 6 months, or sooner if needed, with Lab testing done 3-5 days before  

## 2018-02-23 NOTE — Assessment & Plan Note (Signed)
Lab Results  Component Value Date   HGBA1C 6.8 (H) 02/20/2018  stable overall by history and exam, recent data reviewed with pt, and pt to continue medical treatment as before,  to f/u any worsening symptoms or concerns

## 2018-02-23 NOTE — Progress Notes (Signed)
Subjective:    Patient ID: Jeffrey [name], male    DOB: 1965-11-12, 52 y.o.   MRN: [id number]  HPI  Here to f/u; overall doing ok,  Pt denies chest pain, increasing sob or doe, wheezing, orthopnea, PND, increased LE swelling, palpitations, dizziness or syncope.  Pt denies new neurological symptoms such as new headache, or facial or extremity weakness or numbness.  Pt denies polydipsia, polyuria, or low sugar episode.  Pt states overall good compliance with meds, mostly trying to follow appropriate diet, with wt overall stable,  but little exercise however.  Had right neck ESI with mild increased sugars recntly Wt Readings from Last 3 Encounters:  02/23/18 274 lb (124.3 kg)  02/23/18 274 lb (124.3 kg)  01/30/18 275 lb (124.7 kg)   Past Medical History:  Diagnosis Date  . Allergy   . ANXIETY 07/05/2007  . Arthritis 08/2015   neck and back  . Cancer ([facility])    basal cell  . CARPAL TUNNEL SYNDROME, BILATERAL 07/05/2007  . Cervicalgia 03/18/2008  . DEPRESSION 07/05/2007  . DIABETES MELLITUS, TYPE II 07/05/2007  . ERECTILE DYSFUNCTION 07/11/2007  . GERD 07/11/2007  . HYPERLIPIDEMIA 07/05/2007  . HYPERTENSION 07/05/2007  . LOW BACK PAIN 07/11/2007  . Meralgia paresthetica 09/25/2008  . PEPTIC ULCER DISEASE 07/05/2007  . SINUSITIS- ACUTE-NOS 03/26/2009  . SKIN LESION 03/18/2008   Past Surgical History:  Procedure Laterality Date  . BASAL CELL CARCINOMA EXCISION Right 2011   shoulder area  . left wrist surgury/fracture  1985  . SHOULDER ARTHROSCOPY WITH SUBACROMIAL DECOMPRESSION Left 03/30/2017   Procedure: SHOULDER ARTHROSCOPY WITH DEBRIDEMENT ROTATOR CUFF,SUBACROMIAL DECOMPRESSION AND OPEN TENODESIS;  Surgeon: [name], MD;  Location: [facility];  Service: Orthopedics;  Laterality: Left;  SHOULDER ARTHROSCOPY WITH DEBRIDEMENT ROTATOR CUFF,SUBACROMIAL DECOMPRESSION AND POSSIBLE OPEN TENODESIS  . WISDOM TOOTH EXTRACTION      reports that he quit smoking about 4 years ago. His smoking  use included cigars. He uses smokeless tobacco. He reports that he drinks alcohol. He reports that he does not use drugs. family history includes Cancer in his maternal grandfather; Diabetes in his father, mother, and other; Heart disease in his maternal grandmother; Hypertension in his father, mother, and other. Allergies  Allergen Reactions  . Codeine Itching   Current Outpatient Medications on File Prior to Visit  Medication Sig Dispense Refill  . aspirin EC 81 MG tablet Take 81 mg by mouth every evening.    . gabapentin (NEURONTIN) 300 MG capsule TAKE 1 CAPSULE BY MOUTH 4 TIMES A DAY 360 capsule 1  . ibuprofen (ADVIL,MOTRIN) 800 MG tablet Take 1 tablet (800 mg total) by mouth every 6 (six) hours as needed. 21 tablet 0  . venlafaxine XR (EFFEXOR XR) 37.5 MG 24 hr capsule Take 1 capsule (37.5 mg total) by mouth daily with breakfast. 90 capsule 1  . cyclobenzaprine (FEXMID) 7.5 MG tablet      No current facility-administered medications on file prior to visit.    Review of Systems  Constitutional: Negative for other unusual diaphoresis or sweats HENT: Negative for ear discharge or swelling Eyes: Negative for other worsening visual disturbances Respiratory: Negative for stridor or other swelling  Gastrointestinal: Negative for worsening distension or other blood Genitourinary: Negative for retention or other urinary change Musculoskeletal: Negative for other MSK pain or swelling Skin: Negative for color change or other new lesions Neurological: Negative for worsening tremors and other numbness  Psychiatric/Behavioral: Negative for worsening agitation or other fatigue All other system neg  per pt    Objective:   Physical Exam BP 110/78   Pulse 86   Ht 5\' 11"  (1.803 m)   Wt 274 lb (124.3 kg)   SpO2 97%   BMI 38.22 kg/m  VS noted,  Constitutional: Pt appears in NAD HENT: Head: NCAT.  Right Ear: External ear normal.  Left Ear: External ear normal.  Eyes: . Pupils are equal,  round, and reactive to light. Conjunctivae and EOM are normal Nose: without d/c or deformity Neck: Neck supple. Gross normal ROM Cardiovascular: Normal rate and regular rhythm.   Pulmonary/Chest: Effort normal and breath sounds without rales or wheezing.  Abd:  Soft, NT, ND, + BS, no organomegaly Neurological: Pt is alert. At baseline orientation, motor grossly intact Skin: Skin is warm. No rashes, other new lesions, no LE edema Psychiatric: Pt behavior is normal without agitation  No other exam findings Lab Results  Component Value Date   WBC 6.0 09/11/2017   HGB 15.2 09/11/2017   HCT 43.5 09/11/2017   PLT 189 09/11/2017   GLUCOSE 106 (H) 02/20/2018   CHOL 126 02/20/2018   TRIG 115.0 02/20/2018   HDL 36.00 (L) 02/20/2018   LDLDIRECT 90.0 08/18/2015   LDLCALC 67 02/20/2018   ALT 19 02/20/2018   AST 14 02/20/2018   NA 136 02/20/2018   K 4.4 02/20/2018   CL 102 02/20/2018   CREATININE 0.78 02/20/2018   BUN 11 02/20/2018   CO2 30 02/20/2018   TSH 1.14 02/16/2017   PSA 0.46 02/16/2017   HGBA1C 6.8 (H) 02/20/2018   MICROALBUR <0.7 08/22/2016       Assessment & Plan:

## 2018-02-23 NOTE — Progress Notes (Signed)
[name] [address] [place], [place] [address] Phone: [phone] Subjective:      CC: Neck pain follow-up  HAL:[id number]  Jeffrey [name] is a 52 y.o. male coming in with complaint of neck pain.  Patient has severe arthritic changes of the neck with nerve impingement.  Attempted an epidural 2 weeks ago.  States that he is feeling 60% better.  Noticing the radicular symptoms improving as well.  This is now onset of constant numbness in the fingers has more of a tingling sensation.     Past Medical History:  Diagnosis Date  . Allergy   . ANXIETY 07/05/2007  . Arthritis 08/2015   neck and back  . Cancer ([facility])    basal cell  . CARPAL TUNNEL SYNDROME, BILATERAL 07/05/2007  . Cervicalgia 03/18/2008  . DEPRESSION 07/05/2007  . DIABETES MELLITUS, TYPE II 07/05/2007  . ERECTILE DYSFUNCTION 07/11/2007  . GERD 07/11/2007  . HYPERLIPIDEMIA 07/05/2007  . HYPERTENSION 07/05/2007  . LOW BACK PAIN 07/11/2007  . Meralgia paresthetica 09/25/2008  . PEPTIC ULCER DISEASE 07/05/2007  . SINUSITIS- ACUTE-NOS 03/26/2009  . SKIN LESION 03/18/2008   Past Surgical History:  Procedure Laterality Date  . BASAL CELL CARCINOMA EXCISION Right 2011   shoulder area  . left wrist surgury/fracture  1985  . SHOULDER ARTHROSCOPY WITH SUBACROMIAL DECOMPRESSION Left 03/30/2017   Procedure: SHOULDER ARTHROSCOPY WITH DEBRIDEMENT ROTATOR CUFF,SUBACROMIAL DECOMPRESSION AND OPEN TENODESIS;  Surgeon: [name], MD;  Location: [facility];  Service: Orthopedics;  Laterality: Left;  SHOULDER ARTHROSCOPY WITH DEBRIDEMENT ROTATOR CUFF,SUBACROMIAL DECOMPRESSION AND POSSIBLE OPEN TENODESIS  . WISDOM TOOTH EXTRACTION     Social History   Socioeconomic History  . Marital status: Married    Spouse name: Not on file  . Number of children: 2  . Years of education: Not on file  . Highest education level: Not on file  Occupational History  . Occupation: Librarian, academic for [employer]  . Financial resource strain: Not on file  . Food insecurity:    Worry: Not on file    Inability: Not on file  . Transportation needs:    Medical: Not on file    Non-medical: Not on file  Tobacco Use  . Smoking status: Former Smoker    Types: Cigars    Last attempt to quit: 02/17/2014    Years since quitting: 4.0  . Smokeless tobacco: Current User  . Tobacco comment: ecigs  Substance and Sexual Activity  . Alcohol use: Yes    Alcohol/week: 0.0 oz    Comment: social  . Drug use: No  . Sexual activity: Not on file  Lifestyle  . Physical activity:    Days per week: Not on file    Minutes per session: Not on file  . Stress: Not on file  Relationships  . Social connections:    Talks on phone: Not on file    Gets together: Not on file    Attends religious service: Not on file    Active member of club or organization: Not on file    Attends meetings of clubs or organizations: Not on file    Relationship status: Not on file  Other Topics Concern  . Not on file  Social History Narrative  . Not on file   Allergies  Allergen Reactions  . Codeine Itching   Family History  Problem Relation Age of Onset  . Diabetes Mother   . Hypertension Mother   .  Diabetes Father   . Hypertension Father   . Heart disease Maternal Grandmother   . Cancer Maternal Grandfather        colon cancer  . Diabetes Other   . Hypertension Other   . Colon cancer Neg Hx      Past medical history, social, surgical and family history all reviewed in electronic medical record.  No pertanent information unless stated regarding to the chief complaint.   Review of Systems:Review of systems updated and as accurate as of 02/23/18  No headache, visual changes, nausea, vomiting, diarrhea, constipation, dizziness, abdominal pain, skin rash, fevers, chills, night sweats, weight loss, swollen lymph nodes, body aches, joint swelling, muscle aches, chest pain, shortness of breath,  mood changes.   Objective  Blood pressure 110/78, pulse 86, height 5\' 11"  (1.803 m), weight 274 lb (124.3 kg), SpO2 97 %. Systems examined below as of 02/23/18   General: No apparent distress alert and oriented x3 mood and affect normal, dressed appropriately.  HEENT: Pupils equal, extraocular movements intact  Respiratory: Patient's speak in full sentences and does not appear short of breath  Cardiovascular: No lower extremity edema, non tender, no erythema  Skin: Warm dry intact with no signs of infection or rash on extremities or on axial skeleton.  Abdomen: Soft nontender  Neuro: Cranial nerves II through XII are intact, neurovascularly intact in all extremities with 2+ DTRs and 2+ pulses.  Lymph: No lymphadenopathy of posterior or anterior cervical chain or axillae bilaterally.  Gait normal with good balance and coordination.  MSK:  Non tender with full range of motion and good stability and symmetric strength and tone of shoulders, elbows, wrist, hip, knee and ankles bilaterally.  Neck: Inspection no loss of lordosis. No palpable stepoffs. Mild positive Spurling's maneuver on the right side. Mild limitation in range of motion lacking last 5 to 10 degrees of rotation and sidebending bilaterally Grip strength and sensation normal in bilateral hands Strength good C4 to T1 distribution No sensory change to C4 to T1 Negative Hoffman sign bilaterally Reflexes normal   Impression and Recommendations:     This case required medical decision making of moderate complexity.      Note: This dictation was prepared with Dragon dictation along with smaller phrase technology. Any transcriptional errors that result from this process are unintentional.

## 2018-02-23 NOTE — Assessment & Plan Note (Signed)
stable overall by history and exam, recent data reviewed with pt, and pt to continue medical treatment as before,  to f/u any worsening symptoms or concerns Lab Results  Component Value Date   LDLCALC 67 02/20/2018

## 2018-03-01 LAB — HM DIABETES EYE EXAM

## 2018-03-02 ENCOUNTER — Ambulatory Visit
Admission: RE | Admit: 2018-03-02 | Discharge: 2018-03-02 | Disposition: A | Discharge status: Home / Self Care | Payer: 59 | Source: Ambulatory Visit | Attending: Family Medicine | Admitting: Family Medicine

## 2018-03-02 DIAGNOSIS — M542 Cervicalgia: Secondary | ICD-10-CM

## 2018-03-02 MED ORDER — TRIAMCINOLONE ACETONIDE 40 MG/ML IJ SUSP (RADIOLOGY)
60.0000 mg | Freq: Once | INTRAMUSCULAR | Status: AC
  Administered 2018-03-02: 60 mg via EPIDURAL

## 2018-03-02 MED ORDER — IOPAMIDOL (ISOVUE-M 300) INJECTION 61%
1.0000 mL | Freq: Once | INTRAMUSCULAR | Status: AC | PRN
  Administered 2018-03-02: 1 mL via EPIDURAL

## 2018-03-04 ENCOUNTER — Encounter: Payer: Self-pay | Admitting: Family Medicine

## 2018-03-22 ENCOUNTER — Ambulatory Visit: Payer: 59 | Admitting: Family Medicine

## 2018-03-22 ENCOUNTER — Other Ambulatory Visit: Payer: Self-pay | Admitting: Family Medicine

## 2018-03-27 ENCOUNTER — Other Ambulatory Visit: Payer: Self-pay | Admitting: Family Medicine

## 2018-03-28 ENCOUNTER — Encounter: Payer: Self-pay | Admitting: Internal Medicine

## 2018-03-28 NOTE — Progress Notes (Signed)
[name] [place], [place] [address] Phone: [phone] Subjective:     CC: Neck pain follow-up  UXN:[id number]  Jeffrey [name] is a 52 y.o. male coming in with complaint of neck pain. He does not stiffness but has had a drastic improvement in pain since the epidural.  Epidural was done on March 02, 2018.  Patient states since then about 85% decrease in pain.  No radicular symptoms.  States that he notices still has some decreasing range of motion but otherwise fairly unremarkable.     Past Medical History:  Diagnosis Date  . Allergy   . ANXIETY 07/05/2007  . Arthritis 08/2015   neck and back  . Cancer ([place])    basal cell  . CARPAL TUNNEL SYNDROME, BILATERAL 07/05/2007  . Cervicalgia 03/18/2008  . DEPRESSION 07/05/2007  . DIABETES MELLITUS, TYPE II 07/05/2007  . ERECTILE DYSFUNCTION 07/11/2007  . GERD 07/11/2007  . HYPERLIPIDEMIA 07/05/2007  . HYPERTENSION 07/05/2007  . LOW BACK PAIN 07/11/2007  . Meralgia paresthetica 09/25/2008  . PEPTIC ULCER DISEASE 07/05/2007  . SINUSITIS- ACUTE-NOS 03/26/2009  . SKIN LESION 03/18/2008   Past Surgical History:  Procedure Laterality Date  . BASAL CELL CARCINOMA EXCISION Right 2011   shoulder area  . left wrist surgury/fracture  1985  . SHOULDER ARTHROSCOPY WITH SUBACROMIAL DECOMPRESSION Left 03/30/2017   Procedure: SHOULDER ARTHROSCOPY WITH DEBRIDEMENT ROTATOR CUFF,SUBACROMIAL DECOMPRESSION AND OPEN TENODESIS;  Surgeon: [name], MD;  Location: [facility];  Service: Orthopedics;  Laterality: Left;  SHOULDER ARTHROSCOPY WITH DEBRIDEMENT ROTATOR CUFF,SUBACROMIAL DECOMPRESSION AND POSSIBLE OPEN TENODESIS  . WISDOM TOOTH EXTRACTION     Social History   Socioeconomic History  . Marital status: Married    Spouse name: Not on file  . Number of children: 2  . Years of education: Not on file  . Highest education level: Not on file  Occupational History  . Occupation: Librarian, academic for  [employer]  . Financial resource strain: Not on file  . Food insecurity:    Worry: Not on file    Inability: Not on file  . Transportation needs:    Medical: Not on file    Non-medical: Not on file  Tobacco Use  . Smoking status: Former Smoker    Types: Cigars    Last attempt to quit: 02/17/2014    Years since quitting: 4.1  . Smokeless tobacco: Current User  . Tobacco comment: ecigs  Substance and Sexual Activity  . Alcohol use: Yes    Alcohol/week: 0.0 standard drinks    Comment: social  . Drug use: No  . Sexual activity: Not on file  Lifestyle  . Physical activity:    Days per week: Not on file    Minutes per session: Not on file  . Stress: Not on file  Relationships  . Social connections:    Talks on phone: Not on file    Gets together: Not on file    Attends religious service: Not on file    Active member of club or organization: Not on file    Attends meetings of clubs or organizations: Not on file    Relationship status: Not on file  Other Topics Concern  . Not on file  Social History Narrative  . Not on file   Allergies  Allergen Reactions  . Codeine Itching   Family History  Problem Relation Age of Onset  . Diabetes Mother   . Hypertension Mother   .  Diabetes Father   . Hypertension Father   . Heart disease Maternal Grandmother   . Cancer Maternal Grandfather        colon cancer  . Diabetes Other   . Hypertension Other   . Colon cancer Neg Hx      Past medical history, social, surgical and family history all reviewed in electronic medical record.  No pertanent information unless stated regarding to the chief complaint.   Review of Systems:Review of systems updated and as accurate as of 03/29/18  No headache, visual changes, nausea, vomiting, diarrhea, constipation, dizziness, abdominal pain, skin rash, fevers, chills, night sweats, weight loss, swollen lymph nodes, body aches, joint swelling,  chest pain,  shortness of breath, mood changes.  Positive muscle aches  Objective  Blood pressure 138/82, pulse 85, height 5\' 11"  (1.803 m), weight 272 lb (123.4 kg), SpO2 97 %. Systems examined below as of 03/29/18   General: No apparent distress alert and oriented x3 mood and affect normal, dressed appropriately.  HEENT: Pupils equal, extraocular movements intact  Respiratory: Patient's speak in full sentences and does not appear short of breath  Cardiovascular: No lower extremity edema, non tender, no erythema  Skin: Warm dry intact with no signs of infection or rash on extremities or on axial skeleton.  Abdomen: Soft nontender  Neuro: Cranial nerves II through XII are intact, neurovascularly intact in all extremities with 2+ DTRs and 2+ pulses.  Lymph: No lymphadenopathy of posterior or anterior cervical chain or axillae bilaterally.  Gait normal with good balance and coordination.  MSK:  Non tender with full range of motion and good stability and symmetric strength and tone of shoulders, elbows, wrist, hip, knee and ankles bilaterally.  Neck: Inspection mild loss of lordosis. No palpable stepoffs. Negative Spurling's maneuver. Mild limited range of motion especially with side bending to the right and rotation.  Crepitus noted.  Lacks last 10 degrees of extension Grip strength and sensation normal in bilateral hands Strength good C4 to T1 distribution No sensory change to C4 to T1 Negative Hoffman sign bilaterally Reflexes normal Still some mild tightness in the trapezius bilaterally    Impression and Recommendations:     This case required medical decision making of moderate complexity.      Note: This dictation was prepared with Dragon dictation along with smaller phrase technology. Any transcriptional errors that result from this process are unintentional.

## 2018-03-29 ENCOUNTER — Encounter: Payer: Self-pay | Admitting: Family Medicine

## 2018-03-29 ENCOUNTER — Ambulatory Visit: Payer: 59 | Admitting: Family Medicine

## 2018-03-29 DIAGNOSIS — M5010 Cervical disc disorder with radiculopathy, unspecified cervical region: Secondary | ICD-10-CM | POA: Diagnosis not present

## 2018-03-29 NOTE — Assessment & Plan Note (Signed)
Improvement after second epidural.  Doing much better.  Discussed the muscle relaxer as needed, discussed how to get off the possible of the Effexor.  Patient will titrate down off of this and continue to increase activity as tolerated.  Patient will follow-up again in 8 weeks

## 2018-03-29 NOTE — Patient Instructions (Signed)
Good to see you  Jeffrey [name] is your friend.  Stay active Keep hands within peripheral vision  Effexor daily for 1 week, then 3 times a week for a week then stop and see how you feel  If you start the medicine again contact me so I know to refill it  See me again in 2 months if not perfect

## 2018-03-29 NOTE — Telephone Encounter (Signed)
Done erx 

## 2018-05-03 ENCOUNTER — Other Ambulatory Visit: Payer: Self-pay | Admitting: Family Medicine

## 2018-05-07 NOTE — Telephone Encounter (Signed)
Refill done.  

## 2018-05-09 ENCOUNTER — Encounter: Payer: Self-pay | Admitting: Internal Medicine

## 2018-07-18 ENCOUNTER — Other Ambulatory Visit: Payer: Self-pay | Admitting: Family Medicine

## 2018-08-01 ENCOUNTER — Other Ambulatory Visit: Payer: Self-pay | Admitting: Internal Medicine

## 2018-08-02 NOTE — Telephone Encounter (Signed)
Done erx 

## 2018-08-29 ENCOUNTER — Other Ambulatory Visit (INDEPENDENT_AMBULATORY_CARE_PROVIDER_SITE_OTHER): Payer: 59

## 2018-08-29 DIAGNOSIS — Z0000 Encounter for general adult medical examination without abnormal findings: Secondary | ICD-10-CM

## 2018-08-29 DIAGNOSIS — E119 Type 2 diabetes mellitus without complications: Secondary | ICD-10-CM | POA: Diagnosis not present

## 2018-08-29 LAB — HEMOGLOBIN A1C: Hgb A1c MFr Bld: 7.1 % — ABNORMAL HIGH (ref 4.6–6.5)

## 2018-08-29 LAB — URINALYSIS, ROUTINE W REFLEX MICROSCOPIC
Bilirubin Urine: NEGATIVE
HGB URINE DIPSTICK: NEGATIVE
Ketones, ur: NEGATIVE
NITRITE: NEGATIVE
RBC / HPF: NONE SEEN (ref 0–?)
SPECIFIC GRAVITY, URINE: 1.02 (ref 1.000–1.030)
Total Protein, Urine: NEGATIVE
Urine Glucose: 100 — AB
Urobilinogen, UA: 1 (ref 0.0–1.0)
pH: 6 (ref 5.0–8.0)

## 2018-08-29 LAB — BASIC METABOLIC PANEL
BUN: 16 mg/dL (ref 6–23)
CALCIUM: 9.3 mg/dL (ref 8.4–10.5)
CO2: 29 meq/L (ref 19–32)
CREATININE: 0.85 mg/dL (ref 0.40–1.50)
Chloride: 103 mEq/L (ref 96–112)
GFR: 99.63 mL/min (ref >60.00)
GLUCOSE: 144 mg/dL — AB (ref 70–99)
Potassium: 4.5 mEq/L (ref 3.5–5.1)
SODIUM: 137 meq/L (ref 135–145)

## 2018-08-29 LAB — MICROALBUMIN / CREATININE URINE RATIO
Creatinine,U: 181.1 mg/dL
MICROALB UR: 1.4 mg/dL (ref 0.0–1.9)
MICROALB/CREAT RATIO: 0.8 mg/g (ref 0.0–30.0)

## 2018-08-29 LAB — HEPATIC FUNCTION PANEL
ALBUMIN: 4.3 g/dL (ref 3.5–5.2)
ALK PHOS: 48 U/L (ref 39–117)
ALT: 15 U/L (ref 0–53)
AST: 11 U/L (ref 0–37)
Bilirubin, Direct: 0.1 mg/dL (ref 0.0–0.3)
Total Bilirubin: 0.5 mg/dL (ref 0.2–1.2)
Total Protein: 6.1 g/dL (ref 6.0–8.3)

## 2018-08-29 LAB — CBC WITH DIFFERENTIAL/PLATELET
BASOS ABS: 0 10*3/uL (ref 0.0–0.1)
Basophils Relative: 0.2 % (ref 0.0–3.0)
EOS PCT: 1.4 % (ref 0.0–5.0)
Eosinophils Absolute: 0.1 10*3/uL (ref 0.0–0.7)
HEMATOCRIT: 48.6 % (ref 39.0–52.0)
HEMOGLOBIN: 16.6 g/dL (ref 13.0–17.0)
LYMPHS PCT: 38.9 % (ref 12.0–46.0)
Lymphs Abs: 1.9 10*3/uL (ref 0.7–4.0)
MCHC: 34.2 g/dL (ref 30.0–36.0)
MCV: 90.8 fl (ref 78.0–100.0)
MONOS PCT: 12.3 % — AB (ref 3.0–12.0)
Monocytes Absolute: 0.6 10*3/uL (ref 0.1–1.0)
Neutro Abs: 2.3 10*3/uL (ref 1.4–7.7)
Neutrophils Relative %: 47.2 % (ref 43.0–77.0)
Platelets: 240 10*3/uL (ref 150.0–400.0)
RBC: 5.35 Mil/uL (ref 4.22–5.81)
RDW: 13.2 % (ref 11.5–15.5)
WBC: 4.9 10*3/uL (ref 4.0–10.5)

## 2018-08-29 LAB — LIPID PANEL
CHOL/HDL RATIO: 4
Cholesterol: 132 mg/dL (ref 0–200)
HDL: 35.5 mg/dL — AB (ref >39.00)
LDL Cholesterol: 80 mg/dL (ref 0–99)
NONHDL: 96.71
TRIGLYCERIDES: 82 mg/dL (ref 0.0–149.0)
VLDL: 16.4 mg/dL (ref 0.0–40.0)

## 2018-08-29 LAB — TSH: TSH: 1.25 u[IU]/mL (ref 0.35–4.50)

## 2018-08-29 LAB — PSA: PSA: 0.53 ng/mL (ref 0.10–4.00)

## 2018-08-31 ENCOUNTER — Encounter: Payer: Self-pay | Admitting: Internal Medicine

## 2018-08-31 ENCOUNTER — Ambulatory Visit: Payer: 59 | Admitting: Internal Medicine

## 2018-08-31 VITALS — BP 132/84 | HR 83 | Temp 98.2°F | Ht 71.0 in | Wt 282.0 lb

## 2018-08-31 DIAGNOSIS — E119 Type 2 diabetes mellitus without complications: Secondary | ICD-10-CM | POA: Diagnosis not present

## 2018-08-31 DIAGNOSIS — Z0000 Encounter for general adult medical examination without abnormal findings: Secondary | ICD-10-CM | POA: Diagnosis not present

## 2018-08-31 MED ORDER — CARISOPRODOL 350 MG PO TABS: ORAL_TABLET | ORAL | 1 refills | Status: DC

## 2018-08-31 MED ORDER — VENLAFAXINE HCL ER 37.5 MG PO CP24: ORAL_CAPSULE | ORAL | 1 refills | Status: DC

## 2018-08-31 MED ORDER — GLIPIZIDE ER 10 MG PO TB24
ORAL_TABLET | ORAL | 3 refills | Status: DC
Start: 2018-08-31 — End: 2019-03-01

## 2018-08-31 MED ORDER — ATORVASTATIN CALCIUM 40 MG PO TABS: 40.0000 mg | ORAL_TABLET | Freq: Every day | ORAL | 3 refills | Status: DC

## 2018-08-31 MED ORDER — ALPRAZOLAM 1 MG PO TABS: 1.0000 mg | ORAL_TABLET | Freq: Two times a day (BID) | ORAL | 5 refills | Status: DC | PRN

## 2018-08-31 MED ORDER — PIOGLITAZONE HCL 45 MG PO TABS: 45.0000 mg | ORAL_TABLET | Freq: Every day | ORAL | 3 refills | Status: DC

## 2018-08-31 MED ORDER — GABAPENTIN 300 MG PO CAPS: ORAL_CAPSULE | ORAL | 1 refills | Status: DC

## 2018-08-31 MED ORDER — LISINOPRIL 40 MG PO TABS
40.0000 mg | ORAL_TABLET | Freq: Every day | ORAL | 3 refills | Status: DC
Start: 2018-08-31 — End: 2019-03-01

## 2018-08-31 MED ORDER — METFORMIN HCL 500 MG PO TABS: ORAL_TABLET | ORAL | 3 refills | Status: DC

## 2018-08-31 NOTE — Patient Instructions (Signed)
Please continue all other medications as before, and refills have been done if requested.  Please have the pharmacy call with any other refills you may need.  Please continue your efforts at being more active, low cholesterol diet, and weight control.  You are otherwise up to date with prevention measures today.  Please keep your appointments with your specialists as you may have planned  Please return in 6 months, or sooner if needed, with Lab testing done 3-5 days before  

## 2018-08-31 NOTE — Progress Notes (Signed)
Subjective:    Patient ID: Jeffrey [name], male    DOB: Aug 22, 1965, 53 y.o.   MRN: [id number]  HPI  Here for wellness and f/u;  Overall doing ok;  Pt denies Chest pain, worsening SOB, DOE, wheezing, orthopnea, PND, worsening LE edema, palpitations, dizziness or syncope.  Pt denies neurological change such as new headache, facial or extremity weakness.  Pt denies polydipsia, polyuria, or low sugar symptoms. Pt states overall good compliance with treatment and medications, good tolerability, and has been trying to follow appropriate diet.  Pt denies worsening depressive symptoms, suicidal ideation or panic. No fever, night sweats, wt loss, loss of appetite, or other constitutional symptoms.  Pt states good ability with ADL's, has low fall risk, home safety reviewed and adequate, no other significant changes in hearing or vision, and only occasionally active with exercise.  No new complaints Past Medical History:  Diagnosis Date  . Allergy   . ANXIETY 07/05/2007  . Arthritis 08/2015   neck and back  . Cancer ([place] [facility])    basal cell  . CARPAL TUNNEL SYNDROME, BILATERAL 07/05/2007  . Cervicalgia 03/18/2008  . DEPRESSION 07/05/2007  . DIABETES MELLITUS, TYPE II 07/05/2007  . ERECTILE DYSFUNCTION 07/11/2007  . GERD 07/11/2007  . HYPERLIPIDEMIA 07/05/2007  . HYPERTENSION 07/05/2007  . LOW BACK PAIN 07/11/2007  . Meralgia paresthetica 09/25/2008  . PEPTIC ULCER DISEASE 07/05/2007  . SINUSITIS- ACUTE-NOS 03/26/2009  . SKIN LESION 03/18/2008   Past Surgical History:  Procedure Laterality Date  . BASAL CELL CARCINOMA EXCISION Right 2011   shoulder area  . left wrist surgury/fracture  1985  . SHOULDER ARTHROSCOPY WITH SUBACROMIAL DECOMPRESSION Left 03/30/2017   Procedure: SHOULDER ARTHROSCOPY WITH DEBRIDEMENT ROTATOR CUFF,SUBACROMIAL DECOMPRESSION AND OPEN TENODESIS;  Surgeon: [name], MD;  Location: [facility];  Service: Orthopedics;  Laterality: Left;  SHOULDER ARTHROSCOPY WITH DEBRIDEMENT ROTATOR  CUFF,SUBACROMIAL DECOMPRESSION AND POSSIBLE OPEN TENODESIS  . WISDOM TOOTH EXTRACTION      reports that he quit smoking about 4 years ago. His smoking use included cigars. He uses smokeless tobacco. He reports current alcohol use. He reports that he does not use drugs. family history includes Cancer in his maternal grandfather; Diabetes in his father, mother, and another family member; Heart disease in his maternal grandmother; Hypertension in his father, mother, and another family member. Allergies  Allergen Reactions  . Codeine Itching   Current Outpatient Medications on File Prior to Visit  Medication Sig Dispense Refill  . aspirin EC 81 MG tablet Take 81 mg by mouth every evening.     No current facility-administered medications on file prior to visit.    Review of Systems Constitutional: Negative for other unusual diaphoresis, sweats, appetite or weight changes HENT: Negative for other worsening hearing loss, ear pain, facial swelling, mouth sores or neck stiffness.   Eyes: Negative for other worsening pain, redness or other visual disturbance.  Respiratory: Negative for other stridor or swelling Cardiovascular: Negative for other palpitations or other chest pain  Gastrointestinal: Negative for worsening diarrhea or loose stools, blood in stool, distention or other pain Genitourinary: Negative for hematuria, flank pain or other change in urine volume.  Musculoskeletal: Negative for myalgias or other joint swelling.  Skin: Negative for other color change, or other wound or worsening drainage.  Neurological: Negative for other syncope or numbness. Hematological: Negative for other adenopathy or swelling Psychiatric/Behavioral: Negative for hallucinations, other worsening agitation, SI, self-injury, or new decreased concentration All other system neg per pt    Objective:  Physical Exam BP 132/84   Pulse 83   Temp 98.2 F (36.8 C) (Oral)   Ht 5\' 11"  (1.803 m)   Wt 282 lb (127.9  kg)   SpO2 92%   BMI 39.33 kg/m  VS noted,  Constitutional: Pt is oriented to person, place, and time. Appears well-developed and well-nourished, in no significant distress and comfortable Head: Normocephalic and atraumatic  Eyes: Conjunctivae and EOM are normal. Pupils are equal, round, and reactive to light Right Ear: External ear normal without discharge Left Ear: External ear normal without discharge Nose: Nose without discharge or deformity Mouth/Throat: Oropharynx is without other ulcerations and moist  Neck: Normal range of motion. Neck supple. No JVD present. No tracheal deviation present or significant neck LA or mass Cardiovascular: Normal rate, regular rhythm, normal heart sounds and intact distal pulses.   Pulmonary/Chest: WOB normal and breath sounds without rales or wheezing  Abdominal: Soft. Bowel sounds are normal. NT. No HSM  Musculoskeletal: Normal range of motion. Exhibits no edema Lymphadenopathy: Has no other cervical adenopathy.  Neurological: Pt is alert and oriented to person, place, and time. Pt has normal reflexes. No cranial nerve deficit. Motor grossly intact, Gait intact Skin: Skin is warm and dry. No rash noted or new ulcerations Psychiatric:  Has normal mood and affect. Behavior is normal without agitation No other exam findings Lab Results  Component Value Date   WBC 4.9 08/29/2018   HGB 16.6 08/29/2018   HCT 48.6 08/29/2018   PLT 240.0 08/29/2018   GLUCOSE 144 (H) 08/29/2018   CHOL 132 08/29/2018   TRIG 82.0 08/29/2018   HDL 35.50 (L) 08/29/2018   LDLDIRECT 90.0 08/18/2015   LDLCALC 80 08/29/2018   ALT 15 08/29/2018   AST 11 08/29/2018   NA 137 08/29/2018   K 4.5 08/29/2018   CL 103 08/29/2018   CREATININE 0.85 08/29/2018   BUN 16 08/29/2018   CO2 29 08/29/2018   TSH 1.25 08/29/2018   PSA 0.53 08/29/2018   HGBA1C 7.1 (H) 08/29/2018   MICROALBUR 1.4 08/29/2018       Assessment & Plan:

## 2018-09-02 NOTE — Assessment & Plan Note (Signed)

## 2018-09-02 NOTE — Assessment & Plan Note (Addendum)
stable overall by history and exam, recent data reviewed with pt, and pt to continue medical treatment as before,  to f/u any worsening symptoms or concerns  

## 2018-11-29 ENCOUNTER — Other Ambulatory Visit: Payer: Self-pay | Admitting: Internal Medicine

## 2018-11-30 NOTE — Telephone Encounter (Signed)
Done erx 

## 2019-01-04 ENCOUNTER — Other Ambulatory Visit: Payer: Self-pay | Admitting: Internal Medicine

## 2019-01-15 ENCOUNTER — Encounter: Payer: Self-pay | Admitting: Family Medicine

## 2019-01-18 ENCOUNTER — Encounter: Payer: Self-pay | Admitting: Family Medicine

## 2019-01-18 ENCOUNTER — Ambulatory Visit (INDEPENDENT_AMBULATORY_CARE_PROVIDER_SITE_OTHER): Payer: 59 | Admitting: Family Medicine

## 2019-01-18 DIAGNOSIS — M5010 Cervical disc disorder with radiculopathy, unspecified cervical region: Secondary | ICD-10-CM

## 2019-01-18 DIAGNOSIS — M5412 Radiculopathy, cervical region: Secondary | ICD-10-CM | POA: Diagnosis not present

## 2019-01-18 MED ORDER — PREDNISONE 50 MG PO TABS: 50.0000 mg | ORAL_TABLET | Freq: Every day | ORAL | 0 refills | Status: DC

## 2019-01-18 MED ORDER — TRAMADOL HCL 50 MG PO TABS
50.0000 mg | ORAL_TABLET | Freq: Three times a day (TID) | ORAL | 0 refills | Status: AC | PRN
Start: 2019-01-18 — End: 2019-01-23

## 2019-01-18 NOTE — Progress Notes (Signed)
Virtual Visit via Video Note  I connected with Jeffrey [name] on 01/18/19 at 10:30 AM EDT by a video enabled telemedicine application and verified that I am speaking with the correct person using two identifiers.  Location: Patient: In home setting Provider: In office setting   I discussed the limitations of evaluation and management by telemedicine and the availability of in person appointments. The patient expressed understanding and agreed to proceed.  History of Present Illness: Patient is having significant neck pain.  Has had degenerative disc disease with cervical radiculopathy previously.  Has responded to epidurals previously.  Last one was in July 2019.  States that the last week is starting to increase severe pain again.  Rates the severity of pain is 9 out of 10.  Having radiation down to the hand.  Going to all fingers.  Seems that the significant amount of pain now seems to be elbow.    Observations/Objective: Patient was found to be uncomfortable and is wearing ice on his neck.   Assessment and Plan: Recurrent cervical radiculopathy.  Will reorder the epidural.  Sent in prednisone for 5 days.  Warned potential side effects including his diabetes.  Tramadol given for breakthrough pain and discussed taking a Tylenol.  Follow-up with me again virtually 2 weeks after the epidural   Follow Up Instructions: 2 weeks after the epidural    I discussed the assessment and treatment plan with the patient. The patient was provided an opportunity to ask questions and all were answered. The patient agreed with the plan and demonstrated an understanding of the instructions.   The patient was advised to call back or seek an in-person evaluation if the symptoms worsen or if the condition fails to improve as anticipated.  I provided 26 minutes of face-to-face time during this encounter.   Lyndal Pulley, DO

## 2019-02-08 ENCOUNTER — Encounter: Payer: Self-pay | Admitting: Family Medicine

## 2019-02-20 ENCOUNTER — Ambulatory Visit
Admission: RE | Admit: 2019-02-20 | Discharge: 2019-02-20 | Disposition: A | Discharge status: Home / Self Care | Payer: 59 | Source: Ambulatory Visit | Attending: Family Medicine | Admitting: Family Medicine

## 2019-02-20 ENCOUNTER — Other Ambulatory Visit: Payer: Self-pay

## 2019-02-20 ENCOUNTER — Other Ambulatory Visit: Payer: 59

## 2019-02-20 DIAGNOSIS — M5412 Radiculopathy, cervical region: Secondary | ICD-10-CM

## 2019-02-20 MED ORDER — TRIAMCINOLONE ACETONIDE 40 MG/ML IJ SUSP (RADIOLOGY)
60.0000 mg | Freq: Once | INTRAMUSCULAR | Status: AC
  Administered 2019-02-20: 09:00:00 60 mg via EPIDURAL

## 2019-02-20 MED ORDER — IOPAMIDOL (ISOVUE-M 300) INJECTION 61%
1.0000 mL | Freq: Once | INTRAMUSCULAR | Status: AC | PRN
  Administered 2019-02-20: 09:00:00 1 mL via EPIDURAL

## 2019-02-20 NOTE — Discharge Instructions (Signed)

## 2019-02-25 ENCOUNTER — Other Ambulatory Visit (INDEPENDENT_AMBULATORY_CARE_PROVIDER_SITE_OTHER): Payer: 59

## 2019-02-25 DIAGNOSIS — E119 Type 2 diabetes mellitus without complications: Secondary | ICD-10-CM

## 2019-02-25 LAB — BASIC METABOLIC PANEL
BUN: 18 mg/dL (ref 6–23)
CO2: 24 mEq/L (ref 19–32)
Calcium: 8.8 mg/dL (ref 8.4–10.5)
Chloride: 102 mEq/L (ref 96–112)
Creatinine, Ser: 0.83 mg/dL (ref 0.40–1.50)
GFR: 96.17 mL/min (ref >60.00)
Glucose, Bld: 238 mg/dL — ABNORMAL HIGH (ref 70–99)
Potassium: 4.3 mEq/L (ref 3.5–5.1)
Sodium: 135 mEq/L (ref 135–145)

## 2019-02-25 LAB — HEPATIC FUNCTION PANEL
ALT: 15 U/L (ref 0–53)
AST: 8 U/L (ref 0–37)
Albumin: 4.2 g/dL (ref 3.5–5.2)
Alkaline Phosphatase: 56 U/L (ref 39–117)
Bilirubin, Direct: 0.1 mg/dL (ref 0.0–0.3)
Total Bilirubin: 0.5 mg/dL (ref 0.2–1.2)
Total Protein: 6.3 g/dL (ref 6.0–8.3)

## 2019-02-25 LAB — LIPID PANEL
Cholesterol: 148 mg/dL (ref 0–200)
HDL: 33.9 mg/dL — ABNORMAL LOW (ref >39.00)
LDL Cholesterol: 86 mg/dL (ref 0–99)
NonHDL: 114.4
Total CHOL/HDL Ratio: 4
Triglycerides: 140 mg/dL (ref 0.0–149.0)
VLDL: 28 mg/dL (ref 0.0–40.0)

## 2019-02-25 LAB — HEMOGLOBIN A1C: Hgb A1c MFr Bld: 8.6 % — ABNORMAL HIGH (ref 4.6–6.5)

## 2019-02-26 ENCOUNTER — Encounter: Payer: Self-pay | Admitting: Family Medicine

## 2019-03-01 ENCOUNTER — Ambulatory Visit (INDEPENDENT_AMBULATORY_CARE_PROVIDER_SITE_OTHER): Payer: 59 | Admitting: Internal Medicine

## 2019-03-01 ENCOUNTER — Encounter: Payer: Self-pay | Admitting: Internal Medicine

## 2019-03-01 ENCOUNTER — Other Ambulatory Visit: Payer: Self-pay

## 2019-03-01 VITALS — BP 148/82 | HR 86 | Temp 98.2°F | Ht 71.0 in | Wt 286.0 lb

## 2019-03-01 DIAGNOSIS — I10 Essential (primary) hypertension: Secondary | ICD-10-CM

## 2019-03-01 DIAGNOSIS — Z0000 Encounter for general adult medical examination without abnormal findings: Secondary | ICD-10-CM

## 2019-03-01 DIAGNOSIS — E559 Vitamin D deficiency, unspecified: Secondary | ICD-10-CM

## 2019-03-01 DIAGNOSIS — E119 Type 2 diabetes mellitus without complications: Secondary | ICD-10-CM | POA: Diagnosis not present

## 2019-03-01 DIAGNOSIS — Z23 Encounter for immunization: Secondary | ICD-10-CM | POA: Diagnosis not present

## 2019-03-01 DIAGNOSIS — E611 Iron deficiency: Secondary | ICD-10-CM

## 2019-03-01 DIAGNOSIS — E785 Hyperlipidemia, unspecified: Secondary | ICD-10-CM

## 2019-03-01 DIAGNOSIS — E538 Deficiency of other specified B group vitamins: Secondary | ICD-10-CM

## 2019-03-01 MED ORDER — METFORMIN HCL 500 MG PO TABS: ORAL_TABLET | ORAL | 3 refills | Status: DC

## 2019-03-01 MED ORDER — CARISOPRODOL 350 MG PO TABS: ORAL_TABLET | ORAL | 1 refills | Status: DC

## 2019-03-01 MED ORDER — ATORVASTATIN CALCIUM 80 MG PO TABS: 80.0000 mg | ORAL_TABLET | Freq: Every day | ORAL | 3 refills | Status: DC

## 2019-03-01 MED ORDER — GABAPENTIN 300 MG PO CAPS: ORAL_CAPSULE | ORAL | 1 refills | Status: DC

## 2019-03-01 MED ORDER — AMLODIPINE BESYLATE 5 MG PO TABS: 5.0000 mg | ORAL_TABLET | Freq: Every day | ORAL | 3 refills | Status: DC

## 2019-03-01 MED ORDER — GLIPIZIDE ER 10 MG PO TB24: ORAL_TABLET | ORAL | 3 refills | Status: DC

## 2019-03-01 MED ORDER — PIOGLITAZONE HCL 45 MG PO TABS: 45.0000 mg | ORAL_TABLET | Freq: Every day | ORAL | 3 refills | Status: DC

## 2019-03-01 MED ORDER — LISINOPRIL 40 MG PO TABS: 40.0000 mg | ORAL_TABLET | Freq: Every day | ORAL | 3 refills | Status: DC

## 2019-03-01 MED ORDER — VENLAFAXINE HCL ER 37.5 MG PO CP24: ORAL_CAPSULE | ORAL | 3 refills | Status: DC

## 2019-03-01 MED ORDER — ALPRAZOLAM 1 MG PO TABS: 1.0000 mg | ORAL_TABLET | Freq: Two times a day (BID) | ORAL | 5 refills | Status: DC | PRN

## 2019-03-01 NOTE — Patient Instructions (Addendum)
You had the Tdap tetanus shot today  Ok to increase the glipizide to 10 mg twice per day  OK to increase the Lipitor to 80 mg per day  Please take all new medication as prescribed - the amlodipine 5 mg per day  Please consider a Trulicity or Jardiance type of medication  Please continue all other medications as before, and refills have been done if requested.  Please have the pharmacy call with any other refills you may need.  Please continue your efforts at being more active, low cholesterol diet, and weight control.  You are otherwise up to date with prevention measures today.  Please keep your appointments with your specialists as you may have planned  Please return in 6 months, or sooner if needed, with Lab testing done 3-5 days before

## 2019-03-01 NOTE — Progress Notes (Signed)
Subjective:    Patient ID: Jeffrey [name], male    DOB: 1966-04-19, 53 y.o.   MRN: [id number]  HPI  Here for wellness and f/u;  Overall doing ok;  Pt denies Chest pain, worsening SOB, DOE, wheezing, orthopnea, PND, worsening LE edema, palpitations, dizziness or syncope.  Pt denies neurological change such as new headache, facial or extremity weakness.  Pt denies polydipsia, polyuria, or low sugar symptoms. Pt states overall good compliance with treatment and medications, good tolerability, and has been trying to follow appropriate diet.  Pt denies worsening depressive symptoms, suicidal ideation or panic. No fever, night sweats, wt loss, loss of appetite, or other constitutional symptoms.  Pt states good ability with ADL's, has low fall risk, home safety reviewed and adequate, no other significant changes in hearing or vision, and only occasionally active with exercise.  S/p recent oral steroids and shots related to cervical DjD. Has gained several lbs, plans to lose, declines further BP med increase Wt Readings from Last 3 Encounters:  03/01/19 286 lb (129.7 kg)  08/31/18 282 lb (127.9 kg)  03/29/18 272 lb (123.4 kg)   BP Readings from Last 3 Encounters:  03/01/19 (!) 148/82  02/20/19 (!) 154/85  08/31/18 132/84   Past Medical History:  Diagnosis Date  . Allergy   . ANXIETY 07/05/2007  . Arthritis 08/2015   neck and back  . Cancer ([place])    basal cell  . CARPAL TUNNEL SYNDROME, BILATERAL 07/05/2007  . Cervicalgia 03/18/2008  . DEPRESSION 07/05/2007  . DIABETES MELLITUS, TYPE II 07/05/2007  . ERECTILE DYSFUNCTION 07/11/2007  . GERD 07/11/2007  . HYPERLIPIDEMIA 07/05/2007  . HYPERTENSION 07/05/2007  . LOW BACK PAIN 07/11/2007  . Meralgia paresthetica 09/25/2008  . PEPTIC ULCER DISEASE 07/05/2007  . SINUSITIS- ACUTE-NOS 03/26/2009  . SKIN LESION 03/18/2008   Past Surgical History:  Procedure Laterality Date  . BASAL CELL CARCINOMA EXCISION Right 2011   shoulder area  . left wrist  surgury/fracture  1985  . SHOULDER ARTHROSCOPY WITH SUBACROMIAL DECOMPRESSION Left 03/30/2017   Procedure: SHOULDER ARTHROSCOPY WITH DEBRIDEMENT ROTATOR CUFF,SUBACROMIAL DECOMPRESSION AND OPEN TENODESIS;  Surgeon: [name], MD;  Location: [facility];  Service: Orthopedics;  Laterality: Left;  SHOULDER ARTHROSCOPY WITH DEBRIDEMENT ROTATOR CUFF,SUBACROMIAL DECOMPRESSION AND POSSIBLE OPEN TENODESIS  . WISDOM TOOTH EXTRACTION      reports that he quit smoking about 5 years ago. His smoking use included cigars. He uses smokeless tobacco. He reports current alcohol use. He reports that he does not use drugs. family history includes Cancer in his maternal grandfather; Diabetes in his father, mother, and another family member; Heart disease in his maternal grandmother; Hypertension in his father, mother, and another family member. Allergies  Allergen Reactions  . Codeine Itching   Current Outpatient Medications on File Prior to Visit  Medication Sig Dispense Refill  . aspirin EC 81 MG tablet Take 81 mg by mouth every evening.     No current facility-administered medications on file prior to visit.    Review of Systems  Constitutional: Negative for other unusual diaphoresis or sweats HENT: Negative for ear discharge or swelling Eyes: Negative for other worsening visual disturbances Respiratory: Negative for stridor or other swelling  Gastrointestinal: Negative for worsening distension or other blood Genitourinary: Negative for retention or other urinary change Musculoskeletal: Negative for other MSK pain or swelling Skin: Negative for color change or other new lesions Neurological: Negative for worsening tremors and other numbness  Psychiatric/Behavioral: Negative for worsening agitation or other fatigue  All other system neg per pt    Objective:   Physical Exam BP (!) 148/82 (BP Location: Left Arm, Patient Position: Sitting, Cuff Size: Large)   Pulse 86   Temp 98.2 F (36.8 C) (Oral)    Ht 5\' 11"  (1.803 m)   Wt 286 lb (129.7 kg)   SpO2 96%   BMI 39.89 kg/m  VS noted,  Constitutional: Pt appears in NAD HENT: Head: NCAT.  Right Ear: External ear normal.  Left Ear: External ear normal.  Eyes: . Pupils are equal, round, and reactive to light. Conjunctivae and EOM are normal Nose: without d/c or deformity Neck: Neck supple. Gross normal ROM Cardiovascular: Normal rate and regular rhythm.   Pulmonary/Chest: Effort normal and breath sounds without rales or wheezing.  Abd:  Soft, NT, ND, + BS, no organomegaly Neurological: Pt is alert. At baseline orientation, motor grossly intact Skin: Skin is warm. No rashes, other new lesions, no LE edema Psychiatric: Pt behavior is normal without agitation  No other exam findings Lab Results  Component Value Date   WBC 4.9 08/29/2018   HGB 16.6 08/29/2018   HCT 48.6 08/29/2018   PLT 240.0 08/29/2018   GLUCOSE 238 (H) 02/25/2019   CHOL 148 02/25/2019   TRIG 140.0 02/25/2019   HDL 33.90 (L) 02/25/2019   LDLDIRECT 90.0 08/18/2015   LDLCALC 86 02/25/2019   ALT 15 02/25/2019   AST 8 02/25/2019   NA 135 02/25/2019   K 4.3 02/25/2019   CL 102 02/25/2019   CREATININE 0.83 02/25/2019   BUN 18 02/25/2019   CO2 24 02/25/2019   TSH 1.25 08/29/2018   PSA 0.53 08/29/2018   HGBA1C 8.6 (H) 02/25/2019   MICROALBUR 1.4 08/29/2018       Assessment & Plan:

## 2019-03-02 ENCOUNTER — Encounter: Payer: Self-pay | Admitting: Internal Medicine

## 2019-03-02 NOTE — Assessment & Plan Note (Signed)
Mild uncontrolled, for increased glipizide xl 10 bid, cont diet, wt loss efforts

## 2019-03-02 NOTE — Assessment & Plan Note (Signed)
Also for vit d lab f/u

## 2019-03-02 NOTE — Assessment & Plan Note (Signed)
Mild uncontrolled, declines med change today, o/w stable overall by history and exam, recent data reviewed with pt, and pt to continue medical treatment as before,  to f/u any worsening symptoms or concerns

## 2019-03-02 NOTE — Assessment & Plan Note (Signed)
stable overall by history and exam, recent data reviewed with pt, and pt to continue medical treatment as before,  to f/u any worsening symptoms or concerns, for low chol diet 

## 2019-03-11 ENCOUNTER — Encounter: Payer: Self-pay | Admitting: Family Medicine

## 2019-03-31 ENCOUNTER — Encounter: Payer: Self-pay | Admitting: Family Medicine

## 2019-04-02 ENCOUNTER — Encounter: Payer: Self-pay | Admitting: Neurology

## 2019-04-02 ENCOUNTER — Other Ambulatory Visit: Payer: Self-pay

## 2019-04-02 DIAGNOSIS — M5412 Radiculopathy, cervical region: Secondary | ICD-10-CM

## 2019-05-07 ENCOUNTER — Ambulatory Visit (INDEPENDENT_AMBULATORY_CARE_PROVIDER_SITE_OTHER): Payer: 59 | Admitting: Neurology

## 2019-05-07 ENCOUNTER — Encounter: Payer: Self-pay | Admitting: Family Medicine

## 2019-05-07 ENCOUNTER — Other Ambulatory Visit: Payer: Self-pay

## 2019-05-07 DIAGNOSIS — G5622 Lesion of ulnar nerve, left upper limb: Secondary | ICD-10-CM

## 2019-05-07 DIAGNOSIS — M5412 Radiculopathy, cervical region: Secondary | ICD-10-CM

## 2019-05-07 DIAGNOSIS — G5603 Carpal tunnel syndrome, bilateral upper limbs: Secondary | ICD-10-CM

## 2019-05-07 NOTE — Procedures (Signed)
[facility]  [place], [address]  [place], [place] [address] Tel: [phone] Fax:  [phone] Test Date:  05/07/2019  Patient: Jeffrey [name] DOB: 1965/11/10 Physician: Narda Amber, DO  Sex: Male Height: 5\' 11"  Ref Phys: Hulan Saas, DO  ID#: [id number] Temp: 34.6C Technician:    Patient Complaints: This is a 53 year old man referred for evaluation of bilateral hand numbness tingling  NCV & EMG Findings: Extensive electrodiagnostic testing of the left upper extremity and additional studies of the right shows:  1. Bilateral median sensory responses show prolonged distal peak latency (L5.8, R4.6 ms) and reduced amplitude (L8.2, R8.6 V).  Bilateral ulnar sensory responses are within normal limits. 2. Bilateral median motor responses show prolonged distal onset latency (L4.9, R4.8 ms) and normal amplitude.  Left ulnar motor response shows reduced amplitude (6.1 mV) and decreased conduction velocity (A Elbow-B Elbow, 45 m/s).  Right ulnar motor responses within normal limits. 3. Chronic motor axonal loss changes are seen affecting the left abductor digiti minimi, pronator teres, and triceps muscles.  There is no evidence of active denervation or similar findings in the right upper extremity.  Impression: 1. Bilateral median neuropathy at or distal to the wrist, consistent with a clinical diagnosis of carpal tunnel syndrome.  Overall, these findings are moderate-to-severe in degree electrically. 2. Left ulnar neuropathy with slowing across the elbow, demyelinating and axonal loss in type, moderate. 3. Chronic C7 radiculopathy affecting the left upper extremity, moderate.   ___________________________ Narda Amber, DO    Nerve Conduction Studies Anti Sensory Summary Table   Site NR Peak (ms) Norm Peak (ms) P-T Amp (V) Norm P-T Amp  Left Median Anti Sensory (2nd Digit)  34.6C  Wrist    5.8 <3.6 8.2 >15  Right Median Anti Sensory (2nd Digit)  34.6C  Wrist     4.6 <3.6 8.6 >15  Left Ulnar Anti Sensory (5th Digit)  34.6C  Wrist    2.8 <3.1 12.4 >10  Right Ulnar Anti Sensory (5th Digit)  34.6C  Wrist    2.7 <3.1 16.6 >10   Motor Summary Table   Site NR Onset (ms) Norm Onset (ms) O-P Amp (mV) Norm O-P Amp Site1 Site2 Delta-0 (ms) Dist (cm) Vel (m/s) Norm Vel (m/s)  Left Median Motor (Abd Poll Brev)  34.6C  Wrist    4.9 <4.0 7.9 >6 Elbow Wrist 5.6 31.0 55 >50  Elbow    10.5  7.2         Right Median Motor (Abd Poll Brev)  34.6C  Wrist    4.8 <4.0 7.5 >6 Elbow Wrist 5.7 30.0 53 >50  Elbow    10.5  6.6         Left Ulnar Motor (Abd Dig Minimi)  34.6C  Wrist    2.4 <3.1 6.1 >7 B Elbow Wrist 4.5 25.0 56 >50  B Elbow    6.9  4.9  A Elbow B Elbow 2.2 10.0 45 >50  A Elbow    9.1  4.4         Right Ulnar Motor (Abd Dig Minimi)  34.6C  Wrist    2.7 <3.1 7.5 >7 B Elbow Wrist 4.2 25.0 60 >50  B Elbow    6.9  7.0  A Elbow B Elbow 1.7 10.0 59 >50  A Elbow    8.6  6.9          EMG   Side Muscle Ins Act Fibs Psw Fasc Number Recrt Dur Dur. Amp  Amp. Poly Poly. Comment  Right 1stDorInt Nml Nml Nml Nml Nml Nml Nml Nml Nml Nml Nml Nml N/A  Right Abd Poll Brev Nml Nml Nml Nml Nml Nml Nml Nml Nml Nml Nml Nml N/A  Right PronatorTeres Nml Nml Nml Nml Nml Nml Nml Nml Nml Nml Nml Nml N/A  Right Biceps Nml Nml Nml Nml Nml Nml Nml Nml Nml Nml Nml Nml N/A  Right Triceps Nml Nml Nml Nml Nml Nml Nml Nml Nml Nml Nml Nml N/A  Right Deltoid Nml Nml Nml Nml Nml Nml Nml Nml Nml Nml Nml Nml N/A  Left 1stDorInt Nml Nml Nml Nml Nml Nml Nml Nml Nml Nml Nml Nml N/A  Left Abd Poll Brev Nml Nml Nml Nml Nml Nml Nml Nml Nml Nml Nml Nml N/A  Left PronatorTeres Nml Nml Nml Nml 1- Rapid Some 1+ Some 1+ Nml Nml N/A  Left Biceps Nml Nml Nml Nml Nml Nml Nml Nml Nml Nml Nml Nml N/A  Left Triceps Nml Nml Nml Nml 1- Rapid Some 1+ Some 1+ Nml Nml N/A  Left Deltoid Nml Nml Nml Nml Nml Nml Nml Nml Nml Nml Nml Nml N/A  Left ABD Dig Min Nml Nml Nml Nml 1- Rapid Some 1+ Some 1+ Nml Nml N/A   Left FlexCarpiUln Nml Nml Nml Nml Nml Nml Nml Nml Nml Nml Nml Nml N/A      Waveforms:

## 2019-05-10 ENCOUNTER — Other Ambulatory Visit: Payer: Self-pay

## 2019-05-10 ENCOUNTER — Ambulatory Visit (INDEPENDENT_AMBULATORY_CARE_PROVIDER_SITE_OTHER): Payer: 59 | Admitting: Family Medicine

## 2019-05-10 ENCOUNTER — Encounter: Payer: Self-pay | Admitting: Family Medicine

## 2019-05-10 ENCOUNTER — Ambulatory Visit: Payer: Self-pay

## 2019-05-10 VITALS — BP 142/98 | HR 95 | Ht 71.0 in | Wt 294.0 lb

## 2019-05-10 DIAGNOSIS — M79642 Pain in left hand: Secondary | ICD-10-CM | POA: Diagnosis not present

## 2019-05-10 DIAGNOSIS — M5010 Cervical disc disorder with radiculopathy, unspecified cervical region: Secondary | ICD-10-CM

## 2019-05-10 DIAGNOSIS — M79641 Pain in right hand: Secondary | ICD-10-CM | POA: Diagnosis not present

## 2019-05-10 DIAGNOSIS — G5603 Carpal tunnel syndrome, bilateral upper limbs: Secondary | ICD-10-CM | POA: Diagnosis not present

## 2019-05-10 NOTE — Patient Instructions (Signed)
Brace day and night for 2 weeks then at night for 2 weeks [facility] [phone] See me in 4 weeks for right injection

## 2019-05-10 NOTE — Progress Notes (Signed)
[name] [address] [place], [place] [address] Phone: [phone] Subjective:   Jeffrey [name], am serving as a scribe for Dr. Hulan Saas.    CC: neck and wrist pain   [id number]   01/18/2019 Recurrent cervical radiculopathy.  Will reorder the epidural.  Sent in prednisone for 5 days.  Warned potential side effects including his diabetes.  Tramadol given for breakthrough pain and discussed taking a Tylenol.  Follow-up with me again virtually 2 weeks after the epidural   Update 05/10/2019 Jeffrey [name] is a 53 y.o. male coming in with complaint of bilateral hand pain. Patient states that his left hand is worse than his right. Patient states that he has tingling from his elbow down to his hand. Did have epidural that helped alleviate his symptoms.  Nerve conduction testing did show the patient did have severe carpal tunnel bilaterally.    Past Medical History:  Diagnosis Date  . Allergy   . ANXIETY 07/05/2007  . Arthritis 08/2015   neck and back  . Cancer ([place])    basal cell  . CARPAL TUNNEL SYNDROME, BILATERAL 07/05/2007  . Cervicalgia 03/18/2008  . DEPRESSION 07/05/2007  . DIABETES MELLITUS, TYPE II 07/05/2007  . ERECTILE DYSFUNCTION 07/11/2007  . GERD 07/11/2007  . HYPERLIPIDEMIA 07/05/2007  . HYPERTENSION 07/05/2007  . LOW BACK PAIN 07/11/2007  . Meralgia paresthetica 09/25/2008  . PEPTIC ULCER DISEASE 07/05/2007  . SINUSITIS- ACUTE-NOS 03/26/2009  . SKIN LESION 03/18/2008   Past Surgical History:  Procedure Laterality Date  . BASAL CELL CARCINOMA EXCISION Right 2011   shoulder area  . left wrist surgury/fracture  1985  . SHOULDER ARTHROSCOPY WITH SUBACROMIAL DECOMPRESSION Left 03/30/2017   Procedure: SHOULDER ARTHROSCOPY WITH DEBRIDEMENT ROTATOR CUFF,SUBACROMIAL DECOMPRESSION AND OPEN TENODESIS;  Surgeon: [name], MD;  Location: [facility];  Service: Orthopedics;  Laterality: Left;  SHOULDER ARTHROSCOPY WITH DEBRIDEMENT  ROTATOR CUFF,SUBACROMIAL DECOMPRESSION AND POSSIBLE OPEN TENODESIS  . WISDOM TOOTH EXTRACTION     Social History   Socioeconomic History  . Marital status: Married    Spouse name: Not on file  . Number of children: 2  . Years of education: Not on file  . Highest education level: Not on file  Occupational History  . Occupation: Librarian, academic for [employer]  . Financial resource strain: Not on file  . Food insecurity    Worry: Not on file    Inability: Not on file  . Transportation needs    Medical: Not on file    Non-medical: Not on file  Tobacco Use  . Smoking status: Former Smoker    Types: Cigars    Quit date: 02/17/2014    Years since quitting: 5.2  . Smokeless tobacco: Current User  . Tobacco comment: ecigs  Substance and Sexual Activity  . Alcohol use: Yes    Alcohol/week: 0.0 standard drinks    Comment: social  . Drug use: [name]  . Sexual activity: Not on file  Lifestyle  . Physical activity    Days per week: Not on file    Minutes per session: Not on file  . Stress: Not on file  Relationships  . Social Herbalist on phone: Not on file    Gets together: Not on file    Attends religious service: Not on file    Active member of club or organization: Not on file    Attends meetings of clubs or organizations: Not  on file    Relationship status: Not on file  Other Topics Concern  . Not on file  Social History Narrative  . Not on file   Allergies  Allergen Reactions  . Codeine Itching   Family History  Problem Relation Age of Onset  . Diabetes Mother   . Hypertension Mother   . Diabetes Father   . Hypertension Father   . Heart disease Maternal Grandmother   . Cancer Maternal Grandfather        colon cancer  . Diabetes Other   . Hypertension Other   . Colon cancer Neg Hx     Current Outpatient Medications (Endocrine & Metabolic):  .  glipiZIDE (GLUCOTROL XL) 10 MG 24 hr tablet, 1 tab by mouth twice per day  .  metFORMIN (GLUCOPHAGE) 500 MG tablet, TAKE 2 TABLETS BY MOUTH EVERY MORNING AND TAKE 2 TABLETS BY MOUTH EVERY EVENING .  pioglitazone (ACTOS) 45 MG tablet, Take 1 tablet (45 mg total) by mouth daily.  Current Outpatient Medications (Cardiovascular):  .  amLODipine (NORVASC) 5 MG tablet, Take 1 tablet (5 mg total) by mouth daily. Marland Kitchen  atorvastatin (LIPITOR) 80 MG tablet, Take 1 tablet (80 mg total) by mouth daily. Marland Kitchen  lisinopril (ZESTRIL) 40 MG tablet, Take 1 tablet (40 mg total) by mouth daily.   Current Outpatient Medications (Analgesics):  .  aspirin EC 81 MG tablet, Take 81 mg by mouth every evening.   Current Outpatient Medications (Other):  Marland Kitchen  ALPRAZolam (XANAX) 1 MG tablet, Take 1 tablet (1 mg total) by mouth 2 (two) times daily as needed. .  carisoprodol (SOMA) 350 MG tablet, TAKE 1 TABLET 3 TIMES A DAY AS NEEDED AS DIRECTED .  gabapentin (NEURONTIN) 300 MG capsule, TAKE 1 CAPSULE BY MOUTH 4 TIMES A DAY .  venlafaxine XR (EFFEXOR-XR) 37.5 MG 24 hr capsule, TAKE 1 CAPSULE BY MOUTH EVERY DAY WITH BREAKFAST    Past medical history, social, surgical and family history all reviewed in electronic medical record.  [name] pertanent information unless stated regarding to the chief complaint.   Review of Systems:  [name] headache, visual changes, nausea, vomiting, diarrhea, constipation, dizziness, abdominal pain, skin rash, fevers, chills, night sweats, weight loss, swollen lymph nodes, body aches, joint swelling, m, chest pain, shortness of breath, mood changes.  Positive muscle aches  Objective  Blood pressure (!) 142/98, pulse 95, height 5\' 11"  (1.803 m), weight 294 lb (133.4 kg), SpO2 96 %.   General: [name] apparent distress alert and oriented x3 mood and affect normal, dressed appropriately.  HEENT: Pupils equal, extraocular movements intact  Respiratory: Patient's speak in full sentences and does not appear short of breath  Cardiovascular: [name] lower extremity edema, non tender, [name] erythema   Skin: Warm dry intact with [name] signs of infection or rash on extremities or on axial skeleton.  Abdomen: Soft nontender  Neuro: Cranial nerves II through XII are intact, neurovascularly intact in all extremities with 2+ DTRs and 2+ pulses.  Lymph: [name] lymphadenopathy of posterior or anterior cervical chain or axillae bilaterally.  Gait normal with good balance and coordination.  MSK:  Non tender with full range of motion and good stability and symmetric strength and tone of shoulders, elbows,  hip, knee and ankles bilaterally.  Wrist: Bilateral Inspection normal with [name] visible erythema or swelling. ROM smooth and normal with good flexion and extension and ulnar/radial deviation that is symmetrical with opposite wrist. Palpation is normal over metacarpals, navicular, lunate, and TFCC; tendons without  tenderness/ swelling [name] snuffbox tenderness. [name] tenderness over Canal of [place]. Strength 5/5 in all directions without pain. Negative Finkelstein, positive tinel's and phalens.  Worse on the e left side Negative Watson's test. Patient's neck does have some loss of lordosis.  Positive Spurling's with radicular symptoms in the C6-C7 distribution on the left side capitis noted.  Procedure: Real-time Ultrasound Guided Injection of left carpal tunnel Device: GE Logiq Q7 Ultrasound guided injection is preferred based studies that show increased duration, increased effect, greater accuracy, decreased procedural pain, increased response rate with ultrasound guided versus blind injection.  Verbal informed consent obtained.  Time-out conducted.  Noted [name] overlying erythema, induration, or other signs of local infection.  Skin prepped in a sterile fashion.  Local anesthesia: Topical Ethyl chloride.  With sterile technique and under real time ultrasound guidance:  median nerve visualized.  23g 5/8 inch needle inserted distal to proximal approach into nerve sheath. Pictures taken nfor needle placement.  Patient did have injection of 2 cc of 0.5% Marcaine, and 1 cc of Kenalog 40 mg/dL. Completed without difficulty  Pain immediately resolved suggesting accurate placement of the medication.  Advised to call if fevers/chills, erythema, induration, drainage, or persistent bleeding.  Images permanently stored and available for review in the ultrasound unit.  Impression: Technically successful ultrasound guided injection.   Impression and Recommendations:     This case required medical decision making of moderate complexity. The above documentation has been reviewed and is accurate and complete Lyndal Pulley, DO       Note: This dictation was prepared with Dragon dictation along with smaller phrase technology. Any transcriptional errors that result from this process are unintentional.

## 2019-05-10 NOTE — Assessment & Plan Note (Signed)
Discussed posture and ergonomics, which activities to do which wants to avoid.  Follow-up again in 4 to 8 weeks

## 2019-05-10 NOTE — Assessment & Plan Note (Signed)
Patient given injection today.  Tolerated the procedure well.  Discussed which activities to do which was to avoid.  Discussed icing regimen and home exercises.  Discussed bracing at night.  Brace given today.  See how patient responds.  Differential includes some of the lumbar radiculopathy and if no improvement encourage patient to go through with the epidural of the neck.  Follow-up with me again in 6 weeks and if necessary can do contralateral wrist

## 2019-05-20 ENCOUNTER — Ambulatory Visit
Admission: RE | Admit: 2019-05-20 | Discharge: 2019-05-20 | Disposition: A | Discharge status: Home / Self Care | Payer: 59 | Source: Ambulatory Visit | Attending: Family Medicine | Admitting: Family Medicine

## 2019-05-20 ENCOUNTER — Other Ambulatory Visit: Payer: Self-pay

## 2019-05-20 DIAGNOSIS — M5010 Cervical disc disorder with radiculopathy, unspecified cervical region: Secondary | ICD-10-CM

## 2019-05-20 MED ORDER — IOPAMIDOL (ISOVUE-M 300) INJECTION 61%
1.0000 mL | Freq: Once | INTRAMUSCULAR | Status: AC | PRN
  Administered 2019-05-20: 1 mL via EPIDURAL

## 2019-05-20 MED ORDER — TRIAMCINOLONE ACETONIDE 40 MG/ML IJ SUSP (RADIOLOGY)
60.0000 mg | Freq: Once | INTRAMUSCULAR | Status: AC
  Administered 2019-05-20: 60 mg via EPIDURAL

## 2019-05-20 NOTE — Discharge Instructions (Signed)

## 2019-06-10 ENCOUNTER — Encounter: Payer: Self-pay | Admitting: Family Medicine

## 2019-06-10 ENCOUNTER — Ambulatory Visit: Payer: 59 | Admitting: Family Medicine

## 2019-06-10 ENCOUNTER — Ambulatory Visit: Payer: Self-pay

## 2019-06-10 ENCOUNTER — Other Ambulatory Visit: Payer: Self-pay

## 2019-06-10 VITALS — BP 162/90 | HR 87 | Ht 71.0 in | Wt 285.0 lb

## 2019-06-10 DIAGNOSIS — M25531 Pain in right wrist: Secondary | ICD-10-CM

## 2019-06-10 DIAGNOSIS — G5603 Carpal tunnel syndrome, bilateral upper limbs: Secondary | ICD-10-CM | POA: Diagnosis not present

## 2019-06-10 NOTE — Assessment & Plan Note (Signed)
Patient given injection and tolerated the procedure well.  Does have degenerative disc disease as well as carpal tunnel.  We will need to monitor.  Responding well with the gabapentin and Effexor.  No change in other management.  Follow-up again in 4 to 8 weeks

## 2019-06-10 NOTE — Progress Notes (Signed)
[name] Jeffrey [name], [place] [address] Phone: [phone] Subjective:   I Jeffrey [name] am serving as a Education administrator for Dr. Hulan Saas.  I'm seeing this patient by the request  of:    CC: Wrist and neck pain,  [id number]   05/10/2019 Patient given injection today.  Tolerated the procedure well.  Discussed which activities to do which was to avoid.  Discussed icing regimen and home exercises.  Discussed bracing at night.  Brace given today.  See how patient responds.  Differential includes some of the lumbar radiculopathy and if no improvement encourage patient to go through with the epidural of the neck.  Follow-up with me again in 6 weeks and if necessary can do contralateral wrist  Update 06/10/2019 Jeffrey [name] is a 53 y.o. male coming in with complaint of wrist and neck pain. Epidural on 05/20/2019. Injections in bilateral wrists on 05/10/2019. Patient states he is doing well.  Patient states that the right side is starting to get more discomfort at the moment compared to the left.  He was given a left carpal tunnel injection last exam and feels that he is 95% better.  Feels like his neck is still doing extremely well.     Past Medical History:  Diagnosis Date  . Allergy   . ANXIETY 07/05/2007  . Arthritis 08/2015   neck and back  . Cancer ([place])    basal cell  . CARPAL TUNNEL SYNDROME, BILATERAL 07/05/2007  . Cervicalgia 03/18/2008  . DEPRESSION 07/05/2007  . DIABETES MELLITUS, TYPE II 07/05/2007  . ERECTILE DYSFUNCTION 07/11/2007  . GERD 07/11/2007  . HYPERLIPIDEMIA 07/05/2007  . HYPERTENSION 07/05/2007  . LOW BACK PAIN 07/11/2007  . Meralgia paresthetica 09/25/2008  . PEPTIC ULCER DISEASE 07/05/2007  . SINUSITIS- ACUTE-NOS 03/26/2009  . SKIN LESION 03/18/2008   Past Surgical History:  Procedure Laterality Date  . BASAL CELL CARCINOMA EXCISION Right 2011   shoulder area  . left wrist surgury/fracture  1985  . SHOULDER  ARTHROSCOPY WITH SUBACROMIAL DECOMPRESSION Left 03/30/2017   Procedure: SHOULDER ARTHROSCOPY WITH DEBRIDEMENT ROTATOR CUFF,SUBACROMIAL DECOMPRESSION AND OPEN TENODESIS;  Surgeon: [name], MD;  Location: [facility];  Service: Orthopedics;  Laterality: Left;  SHOULDER ARTHROSCOPY WITH DEBRIDEMENT ROTATOR CUFF,SUBACROMIAL DECOMPRESSION AND POSSIBLE OPEN TENODESIS  . WISDOM TOOTH EXTRACTION     Social History   Socioeconomic History  . Marital status: Married    Spouse name: Not on file  . Number of children: 2  . Years of education: Not on file  . Highest education level: Not on file  Occupational History  . Occupation: Librarian, academic for [employer]  . Financial resource strain: Not on file  . Food insecurity    Worry: Not on file    Inability: Not on file  . Transportation needs    Medical: Not on file    Non-medical: Not on file  Tobacco Use  . Smoking status: Former Smoker    Types: Cigars    Quit date: 02/17/2014    Years since quitting: 5.3  . Smokeless tobacco: Current User  . Tobacco comment: ecigs  Substance and Sexual Activity  . Alcohol use: Yes    Alcohol/week: 0.0 standard drinks    Comment: social  . Drug use: No  . Sexual activity: Not on file  Lifestyle  . Physical activity    Days per week: Not on file    Minutes per session: Not on file  .  Stress: Not on file  Relationships  . Social Herbalist on phone: Not on file    Gets together: Not on file    Attends religious service: Not on file    Active member of club or organization: Not on file    Attends meetings of clubs or organizations: Not on file    Relationship status: Not on file  Other Topics Concern  . Not on file  Social History Narrative  . Not on file   Allergies  Allergen Reactions  . Codeine Itching   Family History  Problem Relation Age of Onset  . Diabetes Mother   . Hypertension Mother   . Diabetes Father   . Hypertension Father   .  Heart disease Maternal Grandmother   . Cancer Maternal Grandfather        colon cancer  . Diabetes Other   . Hypertension Other   . Colon cancer Neg Hx     Current Outpatient Medications (Endocrine & Metabolic):  .  glipiZIDE (GLUCOTROL XL) 10 MG 24 hr tablet, 1 tab by mouth twice per day .  metFORMIN (GLUCOPHAGE) 500 MG tablet, TAKE 2 TABLETS BY MOUTH EVERY MORNING AND TAKE 2 TABLETS BY MOUTH EVERY EVENING .  pioglitazone (ACTOS) 45 MG tablet, Take 1 tablet (45 mg total) by mouth daily.  Current Outpatient Medications (Cardiovascular):  .  amLODipine (NORVASC) 5 MG tablet, Take 1 tablet (5 mg total) by mouth daily. Marland Kitchen  atorvastatin (LIPITOR) 80 MG tablet, Take 1 tablet (80 mg total) by mouth daily.   Current Outpatient Medications (Analgesics):  .  aspirin EC 81 MG tablet, Take 81 mg by mouth every evening.   Current Outpatient Medications (Other):  Marland Kitchen  ALPRAZolam (XANAX) 1 MG tablet, Take 1 tablet (1 mg total) by mouth 2 (two) times daily as needed. .  carisoprodol (SOMA) 350 MG tablet, TAKE 1 TABLET 3 TIMES A DAY AS NEEDED AS DIRECTED .  gabapentin (NEURONTIN) 300 MG capsule, TAKE 1 CAPSULE BY MOUTH 4 TIMES A DAY .  venlafaxine XR (EFFEXOR-XR) 37.5 MG 24 hr capsule, TAKE 1 CAPSULE BY MOUTH EVERY DAY WITH BREAKFAST    Past medical history, social, surgical and family history all reviewed in electronic medical record.  No pertanent information unless stated regarding to the chief complaint.   Review of Systems:  No headache, visual changes, nausea, vomiting, diarrhea, constipation, dizziness, abdominal pain, skin rash, fevers, chills, night sweats, weight loss, swollen lymph nodes, body aches, joint swelling, muscle aches, chest pain, shortness of breath, mood changes.   Objective  Blood pressure (!) 162/90, pulse 87, height 5\' 11"  (1.803 m), weight 285 lb (129.3 kg), SpO2 96 %.    General: No apparent distress alert and oriented x3 mood and affect normal, dressed appropriately.   HEENT: Pupils equal, extraocular movements intact  Respiratory: Patient's speak in full sentences and does not appear short of breath  Cardiovascular:  Trace lower extremity edema, tender, no erythema   Skin: Warm dry intact with no signs of infection or rash on extremities or on axial skeleton.  Abdomen: Soft nontender  Neuro: Cranial nerves II through XII are intact, neurovascularly intact in all extremities with 2+ DTRs and 2+ pulses.  Lymph: No lymphadenopathy of posterior or anterior cervical chain or axillae bilaterally.  Gait normal with good balance and coordination.  MSK:  on tender with full range of motion and good stability and symmetric strength and tone of shoulders, elbows, hip, knee and ankles  bilaterally.  Right wrist exam still some positive Tinel's.  Contralateral wrist is significantly better from previous exam.  Grip strength is intact bilaterally.  Procedure: Real-time Ultrasound Guided Injection of right carpal tunnel Device: GE Logiq Q7  Ultrasound guided injection is preferred based studies that show increased duration, increased effect, greater accuracy, decreased procedural pain, increased response rate with ultrasound guided versus blind injection.  Verbal informed consent obtained.  Time-out conducted.  Noted no overlying erythema, induration, or other signs of local infection.  Skin prepped in a sterile fashion.  Local anesthesia: Topical Ethyl chloride.  With sterile technique and under real time ultrasound guidance:  median nerve visualized.  23g 5/8 inch needle inserted distal to proximal approach into nerve sheath. Pictures taken nfor needle placement. Patient did have injection of 2 cc of 1% lidocaine, 1 cc of 0.5% Marcaine, and 1 cc of Kenalog 40 mg/dL. Completed without difficulty  Pain immediately resolved suggesting accurate placement of the medication.  Advised to call if fevers/chills, erythema, induration, drainage, or persistent bleeding.  Images  permanently stored and available for review in the ultrasound unit.  Impression: Technically successful ultrasound guided injection.    Impression and Recommendations:     This case required medical decision making of moderate complexity. The above documentation has been reviewed and is accurate and complete Lyndal Pulley, DO       Note: This dictation was prepared with Dragon dictation along with smaller phrase technology. Any transcriptional errors that result from this process are unintentional.

## 2019-07-01 ENCOUNTER — Other Ambulatory Visit: Payer: Self-pay | Admitting: Internal Medicine

## 2019-07-02 NOTE — Telephone Encounter (Signed)
Done erx 

## 2019-07-16 LAB — HM DIABETES EYE EXAM

## 2019-07-28 ENCOUNTER — Encounter: Payer: Self-pay | Admitting: Family Medicine

## 2019-07-30 ENCOUNTER — Other Ambulatory Visit: Payer: Self-pay

## 2019-07-30 DIAGNOSIS — M5010 Cervical disc disorder with radiculopathy, unspecified cervical region: Secondary | ICD-10-CM

## 2019-08-26 DIAGNOSIS — M50223 Other cervical disc displacement at C6-C7 level: Secondary | ICD-10-CM | POA: Insufficient documentation

## 2019-09-07 ENCOUNTER — Other Ambulatory Visit: Payer: Self-pay | Admitting: Neurosurgery

## 2019-09-07 DIAGNOSIS — M5412 Radiculopathy, cervical region: Secondary | ICD-10-CM

## 2019-09-10 ENCOUNTER — Other Ambulatory Visit: Payer: Self-pay

## 2019-09-10 ENCOUNTER — Other Ambulatory Visit (INDEPENDENT_AMBULATORY_CARE_PROVIDER_SITE_OTHER): Payer: 59

## 2019-09-10 DIAGNOSIS — E119 Type 2 diabetes mellitus without complications: Secondary | ICD-10-CM

## 2019-09-10 DIAGNOSIS — E611 Iron deficiency: Secondary | ICD-10-CM

## 2019-09-10 DIAGNOSIS — E538 Deficiency of other specified B group vitamins: Secondary | ICD-10-CM | POA: Diagnosis not present

## 2019-09-10 DIAGNOSIS — E559 Vitamin D deficiency, unspecified: Secondary | ICD-10-CM | POA: Diagnosis not present

## 2019-09-10 DIAGNOSIS — Z0000 Encounter for general adult medical examination without abnormal findings: Secondary | ICD-10-CM | POA: Diagnosis not present

## 2019-09-10 LAB — BASIC METABOLIC PANEL
BUN: 13 mg/dL (ref 6–23)
CO2: 28 mEq/L (ref 19–32)
Calcium: 9 mg/dL (ref 8.4–10.5)
Chloride: 107 mEq/L (ref 96–112)
Creatinine, Ser: 0.7 mg/dL (ref 0.40–1.50)
GFR: 116.83 mL/min (ref >60.00)
Glucose, Bld: 192 mg/dL — ABNORMAL HIGH (ref 70–99)
Potassium: 4.6 mEq/L (ref 3.5–5.1)
Sodium: 138 mEq/L (ref 135–145)

## 2019-09-10 LAB — CBC WITH DIFFERENTIAL/PLATELET
Basophils Absolute: 0 10*3/uL (ref 0.0–0.1)
Basophils Relative: 0.2 % (ref 0.0–3.0)
Eosinophils Absolute: 0.1 10*3/uL (ref 0.0–0.7)
Eosinophils Relative: 1.6 % (ref 0.0–5.0)
HCT: 49.1 % (ref 39.0–52.0)
Hemoglobin: 16.5 g/dL (ref 13.0–17.0)
Lymphocytes Relative: 29.1 % (ref 12.0–46.0)
Lymphs Abs: 1.3 10*3/uL (ref 0.7–4.0)
MCHC: 33.6 g/dL (ref 30.0–36.0)
MCV: 93.3 fl (ref 78.0–100.0)
Monocytes Absolute: 0.6 10*3/uL (ref 0.1–1.0)
Monocytes Relative: 13.7 % — ABNORMAL HIGH (ref 3.0–12.0)
Neutro Abs: 2.4 10*3/uL (ref 1.4–7.7)
Neutrophils Relative %: 55.4 % (ref 43.0–77.0)
Platelets: 248 10*3/uL (ref 150.0–400.0)
RBC: 5.26 Mil/uL (ref 4.22–5.81)
RDW: 13.3 % (ref 11.5–15.5)
WBC: 4.3 10*3/uL (ref 4.0–10.5)

## 2019-09-10 LAB — URINALYSIS, ROUTINE W REFLEX MICROSCOPIC
Bilirubin Urine: NEGATIVE
Hgb urine dipstick: NEGATIVE
Ketones, ur: NEGATIVE
Leukocytes,Ua: NEGATIVE
Nitrite: NEGATIVE
RBC / HPF: NONE SEEN (ref 0–?)
Specific Gravity, Urine: 1.02 (ref 1.000–1.030)
Total Protein, Urine: NEGATIVE
Urine Glucose: >=1000 — AB
Urobilinogen, UA: 1 (ref 0.0–1.0)
pH: 7.5 (ref 5.0–8.0)

## 2019-09-10 LAB — MICROALBUMIN / CREATININE URINE RATIO
Creatinine,U: 82.4 mg/dL
Microalb Creat Ratio: 0.8 mg/g (ref 0.0–30.0)
Microalb, Ur: <0.7 mg/dL (ref 0.0–1.9)

## 2019-09-10 LAB — HEPATIC FUNCTION PANEL
ALT: 21 U/L (ref 0–53)
AST: 13 U/L (ref 0–37)
Albumin: 4.2 g/dL (ref 3.5–5.2)
Alkaline Phosphatase: 65 U/L (ref 39–117)
Bilirubin, Direct: 0.1 mg/dL (ref 0.0–0.3)
Total Bilirubin: 0.4 mg/dL (ref 0.2–1.2)
Total Protein: 5.9 g/dL — ABNORMAL LOW (ref 6.0–8.3)

## 2019-09-10 LAB — LIPID PANEL
Cholesterol: 129 mg/dL (ref 0–200)
HDL: 36 mg/dL — ABNORMAL LOW (ref >39.00)
LDL Cholesterol: 70 mg/dL (ref 0–99)
NonHDL: 93.23
Total CHOL/HDL Ratio: 4
Triglycerides: 117 mg/dL (ref 0.0–149.0)
VLDL: 23.4 mg/dL (ref 0.0–40.0)

## 2019-09-10 LAB — IBC PANEL
Iron: 122 ug/dL (ref 42–165)
Saturation Ratios: 32.8 % (ref 20.0–50.0)
Transferrin: 266 mg/dL (ref 212.0–360.0)

## 2019-09-10 LAB — VITAMIN B12: Vitamin B-12: 353 pg/mL (ref 211–911)

## 2019-09-10 LAB — VITAMIN D 25 HYDROXY (VIT D DEFICIENCY, FRACTURES): VITD: 7.24 ng/mL — ABNORMAL LOW (ref 30.00–100.00)

## 2019-09-10 LAB — TSH: TSH: 1.45 u[IU]/mL (ref 0.35–4.50)

## 2019-09-10 LAB — PSA: PSA: 0.38 ng/mL (ref 0.10–4.00)

## 2019-09-10 LAB — HEMOGLOBIN A1C: Hgb A1c MFr Bld: 7.9 % — ABNORMAL HIGH (ref 4.6–6.5)

## 2019-09-13 ENCOUNTER — Ambulatory Visit: Payer: 59 | Admitting: Internal Medicine

## 2019-09-13 ENCOUNTER — Encounter: Payer: Self-pay | Admitting: Internal Medicine

## 2019-09-13 ENCOUNTER — Other Ambulatory Visit: Payer: Self-pay

## 2019-09-13 VITALS — BP 142/88 | HR 77 | Temp 98.0°F | Ht 71.0 in | Wt 289.8 lb

## 2019-09-13 DIAGNOSIS — E559 Vitamin D deficiency, unspecified: Secondary | ICD-10-CM

## 2019-09-13 DIAGNOSIS — Z0000 Encounter for general adult medical examination without abnormal findings: Secondary | ICD-10-CM

## 2019-09-13 DIAGNOSIS — E119 Type 2 diabetes mellitus without complications: Secondary | ICD-10-CM

## 2019-09-13 MED ORDER — TRULICITY 0.75 MG/0.5ML ~~LOC~~ SOAJ: 0.7500 mg | SUBCUTANEOUS | 3 refills | Status: DC

## 2019-09-13 MED ORDER — VITAMIN D (ERGOCALCIFEROL) 1.25 MG (50000 UNIT) PO CAPS: 50000.0000 [IU] | ORAL_CAPSULE | ORAL | 0 refills | Status: DC

## 2019-09-13 NOTE — Assessment & Plan Note (Signed)

## 2019-09-13 NOTE — Progress Notes (Signed)
Subjective:    Patient ID: Jeffrey [name], male    DOB: 1966/04/23, 54 y.o.   MRN: [id number]  HPI Here for wellness and f/u;  Overall doing ok;  Pt denies Chest pain, worsening SOB, DOE, wheezing, orthopnea, PND, worsening LE edema, palpitations, dizziness or syncope.  Pt denies neurological change such as new headache, facial or extremity weakness.  Pt denies polydipsia, polyuria, or low sugar symptoms. Pt states overall good compliance with treatment and medications, good tolerability, and has been trying to follow appropriate diet.  Pt denies worsening depressive symptoms, suicidal ideation or panic. No fever, night sweats, wt loss, loss of appetite, or other constitutional symptoms.  Pt states good ability with ADL's, has low fall risk, home safety reviewed and adequate, no other significant changes in hearing or vision, and only occasionally active with exercise. 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   Past Medical History:  Diagnosis Date  . Allergy   . ANXIETY 07/05/2007  . Arthritis 08/2015   neck and back  . Cancer ([place])    basal cell  . CARPAL TUNNEL SYNDROME, BILATERAL 07/05/2007  . Cervicalgia 03/18/2008  . DEPRESSION 07/05/2007  . DIABETES MELLITUS, TYPE II 07/05/2007  . ERECTILE DYSFUNCTION 07/11/2007  . GERD 07/11/2007  . HYPERLIPIDEMIA 07/05/2007  . HYPERTENSION 07/05/2007  . LOW BACK PAIN 07/11/2007  . Meralgia paresthetica 09/25/2008  . PEPTIC ULCER DISEASE 07/05/2007  . SINUSITIS- ACUTE-NOS 03/26/2009  . SKIN LESION 03/18/2008   Past Surgical History:  Procedure Laterality Date  . BASAL CELL CARCINOMA EXCISION Right 2011   shoulder area  . left wrist surgury/fracture  1985  . SHOULDER ARTHROSCOPY WITH SUBACROMIAL DECOMPRESSION  Left 03/30/2017   Procedure: SHOULDER ARTHROSCOPY WITH DEBRIDEMENT ROTATOR CUFF,SUBACROMIAL DECOMPRESSION AND OPEN TENODESIS;  Surgeon: [name], MD;  Location: [facility];  Service: Orthopedics;  Laterality: Left;  SHOULDER ARTHROSCOPY WITH DEBRIDEMENT ROTATOR CUFF,SUBACROMIAL DECOMPRESSION AND POSSIBLE OPEN TENODESIS  . WISDOM TOOTH EXTRACTION      reports that he quit smoking about 5 years ago. His smoking use included cigars. He uses smokeless tobacco. He reports current alcohol use. He reports that he does not use drugs. family history includes Cancer in his maternal grandfather; Diabetes in his father, mother, and another family member; Heart disease in his maternal grandmother; Hypertension in his father, mother, and another family member. Allergies  Allergen Reactions  . Codeine Itching   Current Outpatient Medications on File Prior to Visit  Medication Sig Dispense Refill  . ALPRAZolam (XANAX) 1 MG tablet Take 1 tablet (1 mg total) by mouth 2 (two) times daily as needed. 60 tablet 5  . amLODipine (NORVASC) 5 MG tablet Take 1 tablet (5 mg total) by mouth daily. 90 tablet 3  . aspirin EC 81 MG tablet Take 81 mg by mouth every evening.    . carisoprodol (SOMA) 350 MG tablet TAKE 1 TABLET 3 TIMES A DAY AS NEEDED AS DIRECTED 90 tablet 1  . gabapentin (NEURONTIN) 300 MG capsule TAKE 1 CAPSULE BY MOUTH 4 TIMES A DAY 360 capsule 1  . glipiZIDE (GLUCOTROL XL) 10 MG 24 hr tablet 1 tab by mouth twice per day 180 tablet 3  . metFORMIN (GLUCOPHAGE) 500 MG tablet  TAKE 2 TABLETS BY MOUTH EVERY MORNING AND TAKE 2 TABLETS BY MOUTH EVERY EVENING 360 tablet 3  . pioglitazone (ACTOS) 45 MG tablet Take 1 tablet (45 mg total) by mouth daily. 90 tablet 3  . atorvastatin (LIPITOR) 80 MG tablet Take 1 tablet (80 mg total) by mouth daily. 90 tablet 3  . venlafaxine XR (EFFEXOR-XR) 37.5 MG 24 hr capsule TAKE 1 CAPSULE BY MOUTH EVERY DAY WITH BREAKFAST 90 capsule 3   No current facility-administered  medications on file prior to visit.   Review of Systems All otherwise neg per pt     Objective:   Physical Exam BP (!) 142/88   Pulse 77   Temp 98 F (36.7 C)   Ht 5\' 11"  (1.803 m)   Wt 289 lb 12.8 oz (131.5 kg)   SpO2 99%   BMI 40.42 kg/m  VS noted,  Constitutional: Pt appears in NAD HENT: Head: NCAT.  Right Ear: External ear normal.  Left Ear: External ear normal.  Eyes: . Pupils are equal, round, and reactive to light. Conjunctivae and EOM are normal Nose: without d/c or deformity Neck: Neck supple. Gross normal ROM Cardiovascular: Normal rate and regular rhythm.   Pulmonary/Chest: Effort normal and breath sounds without rales or wheezing.  Abd:  Soft, NT, ND, + BS, no organomegaly Neurological: Pt is alert. At baseline orientation, motor grossly intact Skin: Skin is warm. No rashes, other new lesions, no LE edema Psychiatric: Pt behavior is normal without agitation  All otherwise neg per pt Lab Results  Component Value Date   WBC 4.3 09/10/2019   HGB 16.5 09/10/2019   HCT 49.1 09/10/2019   PLT 248.0 09/10/2019   GLUCOSE 192 (H) 09/10/2019   CHOL 129 09/10/2019   TRIG 117.0 09/10/2019   HDL 36.00 (L) 09/10/2019   LDLDIRECT 90.0 08/18/2015   LDLCALC 70 09/10/2019   ALT 21 09/10/2019   AST 13 09/10/2019   NA 138 09/10/2019   K 4.6 09/10/2019   CL 107 09/10/2019   CREATININE 0.70 09/10/2019   BUN 13 09/10/2019   CO2 28 09/10/2019   TSH 1.45 09/10/2019   PSA 0.38 09/10/2019   HGBA1C 7.9 (H) 09/10/2019   MICROALBUR <0.7 09/10/2019      Assessment & Plan:

## 2019-09-13 NOTE — Patient Instructions (Addendum)
Please take all new medication as prescribed - the trulicity  Please take Vitamin D 50000 units weekly for 12 weeks, then plan to change to OTC Vitamin D3 at 2000 units per day, indefinitely.  Please continue all other medications as before, and refills have been done if requested.  Please have the pharmacy call with any other refills you may need.  Please continue your efforts at being more active, low cholesterol diet, and weight control.  You are otherwise up to date with prevention measures today.  Please keep your appointments with your specialists as you may have planned  Please make an Appointment to return in 6 months, or sooner if needed, also with Lab Appointment for testing done 3-5 days before at the [facility] (so this is for TWO appointments - please see the scheduling desk as you leave)

## 2019-09-13 NOTE — Assessment & Plan Note (Signed)
Uncontrolled, to add trulicity

## 2019-09-13 NOTE — Assessment & Plan Note (Signed)
For oral replacement 

## 2019-09-28 ENCOUNTER — Other Ambulatory Visit: Payer: 59

## 2019-10-02 ENCOUNTER — Other Ambulatory Visit: Payer: Self-pay | Admitting: Internal Medicine

## 2019-10-02 NOTE — Telephone Encounter (Signed)
Done erx 

## 2019-10-04 ENCOUNTER — Other Ambulatory Visit: Payer: Self-pay | Admitting: *Deleted

## 2019-10-04 MED ORDER — ALPRAZOLAM 1 MG PO TABS: 1.0000 mg | ORAL_TABLET | Freq: Two times a day (BID) | ORAL | 5 refills | Status: DC | PRN

## 2019-10-04 NOTE — Telephone Encounter (Signed)
Done erx 

## 2019-10-25 ENCOUNTER — Ambulatory Visit
Admission: RE | Admit: 2019-10-25 | Discharge: 2019-10-25 | Disposition: A | Discharge status: Home / Self Care | Payer: 59 | Source: Ambulatory Visit | Attending: Neurosurgery | Admitting: Neurosurgery

## 2019-10-25 ENCOUNTER — Other Ambulatory Visit: Payer: Self-pay

## 2019-10-25 DIAGNOSIS — M5412 Radiculopathy, cervical region: Secondary | ICD-10-CM

## 2019-11-02 ENCOUNTER — Other Ambulatory Visit: Payer: Self-pay | Admitting: Internal Medicine

## 2019-11-03 ENCOUNTER — Other Ambulatory Visit: Payer: Self-pay | Admitting: Internal Medicine

## 2019-11-03 MED ORDER — GABAPENTIN 300 MG PO CAPS: 300.0000 mg | ORAL_CAPSULE | Freq: Four times a day (QID) | ORAL | 1 refills | Status: DC

## 2019-11-03 MED ORDER — GLIPIZIDE ER 10 MG PO TB24: 10.0000 mg | ORAL_TABLET | Freq: Every day | ORAL | 3 refills | Status: DC

## 2019-11-05 ENCOUNTER — Encounter: Payer: Self-pay | Admitting: Internal Medicine

## 2019-11-06 MED ORDER — FARXIGA 10 MG PO TABS: 10.0000 mg | ORAL_TABLET | Freq: Every day | ORAL | 3 refills | Status: DC

## 2019-11-08 ENCOUNTER — Ambulatory Visit: Payer: 59 | Attending: Internal Medicine

## 2019-11-08 DIAGNOSIS — Z23 Encounter for immunization: Secondary | ICD-10-CM

## 2019-11-08 NOTE — Progress Notes (Signed)
   Covid-19 Vaccination Clinic  Name:  Jeffrey [name]    MRN: [id number] DOB: 05-Dec-1965  11/08/2019  [name] was observed post Covid-19 immunization for 15 minutes without incident. He was provided with Vaccine Information Sheet and instruction to access the V-Safe system.   [name] was instructed to call 911 with any severe reactions post vaccine: Marland Kitchen Difficulty breathing  . Swelling of face and throat  . A fast heartbeat  . A bad rash all over body  . Dizziness and weakness   Immunizations Administered    Name Date Dose VIS Date Route   Pfizer COVID-19 Vaccine 11/08/2019 10:52 AM 0.3 mL 07/26/2019 Intramuscular   Manufacturer: [place]   Lot: [id number]   [place]: [id number]

## 2019-11-09 IMAGING — XA DG INJECT/[PERSON_NAME] INC NEEDLE/CATH/PLC EPI/CERV/THOR W/IMG
2 series · 2 of 2 positions shown · non-contrast
Comparison: none

CLINICAL DATA: Cervical radiculopathy. Patient it well with to
epidural injections less over. Pain has since recurred.

[Series 1: ortho standard · 1 of 1 slices shown (1 of 2)]
[im 1/1]
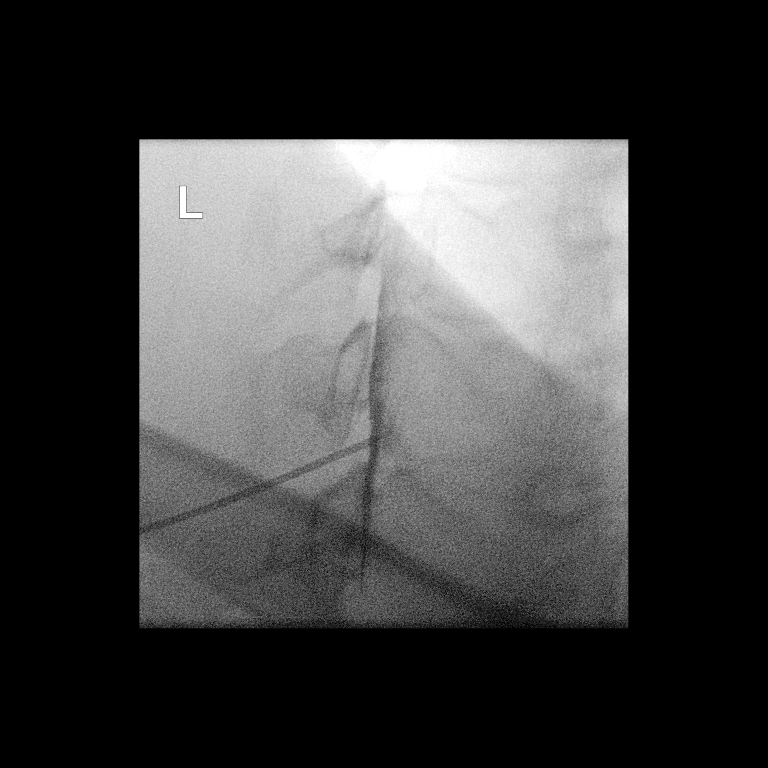

[Series 2: ortho standard · 1 of 1 slices shown (2 of 2)]
[im 1/1]
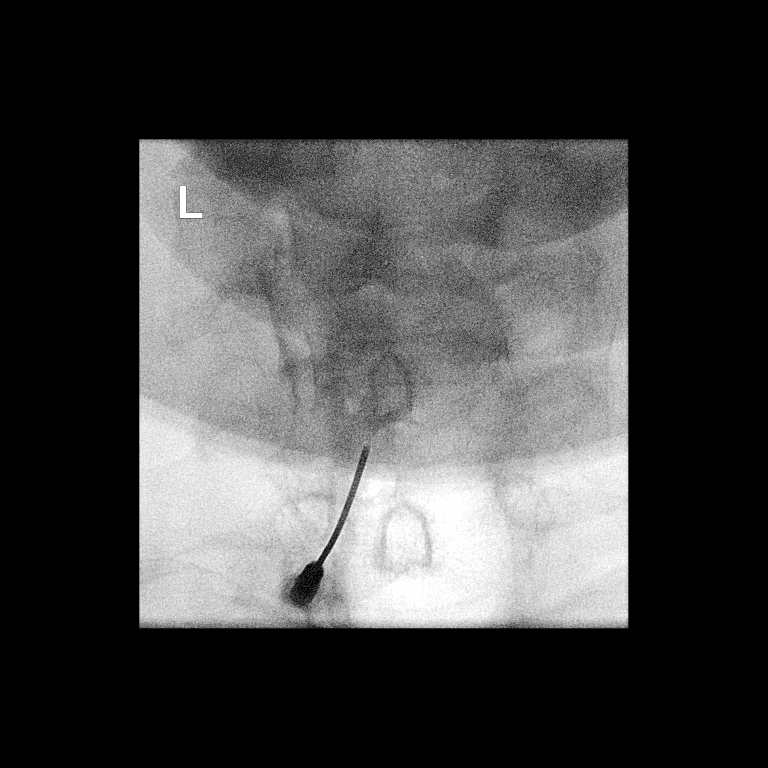

[2 of 2 positions shown; findings below may reference images not displayed]

FLUOROSCOPY TIME:  Radiation Exposure Index (as provided by the
fluoroscopic device): 22.09 uGy*m2

Fluoroscopy Time:  40 seconds

Number of Acquired Images:  0

PROCEDURE:
CERVICAL EPIDURAL INJECTION

An interlaminar approach was performed on the left at C6-7. A 20
gauge epidural needle was advanced using loss-of-resistance
technique.

DIAGNOSTIC EPIDURAL INJECTION

Injection of Isovue-M 300 shows a good epidural pattern with spread
above and below the level of needle placement, primarily on the
left. No vascular opacification is seen. THERAPEUTIC

EPIDURAL INJECTION

1.5 ml of Kenalog 40 mixed with 1 ml of 1% Lidocaine and 2 ml of
normal saline were then instilled. The procedure was well-tolerated,
and the patient was discharged thirty minutes following the
injection in good condition.
IMPRESSION: Technically successful first epidural injection on the left at C6-7.

## 2019-11-18 ENCOUNTER — Encounter: Payer: Self-pay | Admitting: Family Medicine

## 2019-12-03 ENCOUNTER — Ambulatory Visit: Payer: 59 | Attending: Internal Medicine

## 2019-12-03 DIAGNOSIS — Z23 Encounter for immunization: Secondary | ICD-10-CM

## 2019-12-03 NOTE — Progress Notes (Signed)
   Covid-19 Vaccination Clinic  Name:  Jeffrey [name]    MRN: [id number] DOB: 02-Jun-1966  12/03/2019  [name] was observed post Covid-19 immunization for 15 minutes without incident. He was provided with Vaccine Information Sheet and instruction to access the V-Safe system.   [name] was instructed to call 911 with any severe reactions post vaccine: Marland Kitchen Difficulty breathing  . Swelling of face and throat  . A fast heartbeat  . A bad rash all over body  . Dizziness and weakness   Immunizations Administered    Name Date Dose VIS Date Route   Pfizer COVID-19 Vaccine 12/03/2019 10:50 AM 0.3 mL 10/09/2018 Intramuscular   Manufacturer: [place]   Lot: [id number]   [place]: [id number]

## 2019-12-09 ENCOUNTER — Other Ambulatory Visit: Payer: Self-pay | Admitting: Internal Medicine

## 2019-12-09 NOTE — Telephone Encounter (Signed)
Done erx 

## 2019-12-25 DIAGNOSIS — M4802 Spinal stenosis, cervical region: Secondary | ICD-10-CM | POA: Diagnosis present

## 2019-12-26 ENCOUNTER — Other Ambulatory Visit: Payer: Self-pay | Admitting: Neurosurgery

## 2020-01-08 NOTE — Progress Notes (Signed)
[id number] [name], [place] - [address] [place] [address] Phone: [phone] Fax: [phone]      Your procedure is scheduled on Thursday June 3  Report to [facility] Main Entrance "A" at 0930 A.M., and check in at the Admitting office.  Call this number if you have problems the morning of surgery:  [phone]  Call [phone] if you have any questions prior to your surgery date Monday-Friday 8am-4pm    Remember:  Do not eat or drink after midnight the night before your surgery    Take these medicines the morning of surgery with A SIP OF WATER  ALPRAZolam (XANAX) if needed amLODipine (NORVASC) atorvastatin (LIPITOR)  carisoprodol (SOMA) gabapentin (NEURONTIN) venlafaxine XR (EFFEXOR-XR  Follow your surgeon's instructions on when to stop Aspirin.  If no instructions were given by your surgeon then you will need to call the office to get those instructions.    As of today, STOP taking any , Aleve, Naproxen, Ibuprofen, Motrin, Advil, Goody's, BC's, all herbal medications, fish oil, and all vitamins.   WHAT DO I DO ABOUT MY DIABETES MEDICATION?   Marland Kitchen Do not take oral diabetes medicines (pills) the morning of surgery. glipiZIDE (GLUCOTROL XL), dapagliflozin propanediol (FARXIGA), and metFORMIN (GLUCOPHAGE)  HOW TO MANAGE YOUR DIABETES BEFORE AND AFTER SURGERY  Why is it important to control my blood sugar before and after surgery? . Improving blood sugar levels before and after surgery helps healing and can limit problems. . A way of improving blood sugar control is eating a healthy diet by: o  Eating less sugar and carbohydrates o  Increasing activity/exercise o  Talking with your doctor about reaching your blood sugar goals . High blood sugars (greater than 180 mg/dL) can raise your risk of infections and slow your recovery, so you will need to focus on controlling your diabetes during the weeks  before surgery. . Make sure that the doctor who takes care of your diabetes knows about your planned surgery including the date and location.  How do I manage my blood sugar before surgery? . Check your blood sugar at least 4 times a day, starting 2 days before surgery, to make sure that the level is not too high or low. . Check your blood sugar the morning of your surgery when you wake up and every 2 hours until you get to the Short Stay unit. o If your blood sugar is less than 70 mg/dL, you will need to treat for low blood sugar: - Do not take insulin. - Treat a low blood sugar (less than 70 mg/dL) with  cup of clear juice (cranberry or apple), 4 glucose tablets, OR glucose gel. - Recheck blood sugar in 15 minutes after treatment (to make sure it is greater than 70 mg/dL). If your blood sugar is not greater than 70 mg/dL on recheck, call [phone] for further instructions. . Report your blood sugar to the short stay nurse when you get to Short Stay.  . If you are admitted to the hospital after surgery: o Your blood sugar will be checked by the staff and you will probably be given insulin after surgery (instead of oral diabetes medicines) to make sure you have good blood sugar levels. o The goal for blood sugar control after surgery is 80-180 mg/dL                      Do not wear  jewelry            Do not wear lotions, powders, colognes, or deodorant.           Men may shave face and neck.            Do not bring valuables to the hospital.            [facility] is not responsible for any belongings or valuables.  Do NOT Smoke (Tobacco/Vapping) or drink Alcohol 24 hours prior to your procedure If you use a CPAP at night, you may bring all equipment for your overnight stay.   Contacts, glasses, dentures or bridgework may not be worn into surgery.      For patients admitted to the hospital, discharge time will be determined by your treatment team.   Patients discharged the day of  surgery will not be allowed to drive home, and someone needs to stay with them for 24 hours.    Special instructions:   [facility]- Preparing For Surgery  Before surgery, you can play an important role. Because skin is not sterile, your skin needs to be as free of germs as possible. You can reduce the number of germs on your skin by washing with CHG (chlorahexidine gluconate) Soap before surgery.  CHG is an antiseptic cleaner which kills germs and bonds with the skin to continue killing germs even after washing.    Oral Hygiene is also important to reduce your risk of infection.  Remember - BRUSH YOUR TEETH THE MORNING OF SURGERY WITH YOUR REGULAR TOOTHPASTE  Please do not use if you have an allergy to CHG or antibacterial soaps. If your skin becomes reddened/irritated stop using the CHG.  Do not shave (including legs and underarms) for at least 48 hours prior to first CHG shower. It is OK to shave your face.  Please follow these instructions carefully.   1. Shower the NIGHT BEFORE SURGERY and the MORNING OF SURGERY with CHG Soap.   2. If you chose to wash your hair, wash your hair first as usual with your normal shampoo.  3. After you shampoo, rinse your hair and body thoroughly to remove the shampoo.  4. Use CHG as you would any other liquid soap. You can apply CHG directly to the skin and wash gently with a scrungie or a clean washcloth.   5. Apply the CHG Soap to your body ONLY FROM THE NECK DOWN.  Do not use on open wounds or open sores. Avoid contact with your eyes, ears, mouth and genitals (private parts). Wash Face and genitals (private parts)  with your normal soap.   6. Wash thoroughly, paying special attention to the area where your surgery will be performed.  7. Thoroughly rinse your body with warm water from the neck down.  8. DO NOT shower/wash with your normal soap after using and rinsing off the CHG Soap.  9. Pat yourself dry with a CLEAN TOWEL.  10. Wear CLEAN  PAJAMAS to bed the night before surgery, wear comfortable clothes the morning of surgery  11. Place CLEAN SHEETS on your bed the night of your first shower and DO NOT SLEEP WITH PETS.   Day of Surgery:   Do not apply any deodorants/lotions.  Please wear clean clothes to the hospital/surgery center.   Remember to brush your teeth WITH YOUR REGULAR TOOTHPASTE.   Please read over the following fact sheets that you were given.

## 2020-01-09 ENCOUNTER — Other Ambulatory Visit: Payer: Self-pay

## 2020-01-09 ENCOUNTER — Encounter (HOSPITAL_COMMUNITY)
Admission: RE | Admit: 2020-01-09 | Discharge: 2020-01-09 | Disposition: A | Discharge status: Home / Self Care | Payer: 59 | Source: Ambulatory Visit | Attending: Neurosurgery | Admitting: Neurosurgery

## 2020-01-09 ENCOUNTER — Encounter (HOSPITAL_COMMUNITY): Payer: Self-pay

## 2020-01-09 DIAGNOSIS — Z01818 Encounter for other preprocedural examination: Secondary | ICD-10-CM | POA: Insufficient documentation

## 2020-01-09 DIAGNOSIS — I10 Essential (primary) hypertension: Secondary | ICD-10-CM | POA: Diagnosis not present

## 2020-01-09 DIAGNOSIS — E118 Type 2 diabetes mellitus with unspecified complications: Secondary | ICD-10-CM | POA: Insufficient documentation

## 2020-01-09 LAB — CBC
HCT: 54.1 % — ABNORMAL HIGH (ref 39.0–52.0)
Hemoglobin: 18.3 g/dL — ABNORMAL HIGH (ref 13.0–17.0)
MCH: 31.7 pg (ref 26.0–34.0)
MCHC: 33.8 g/dL (ref 30.0–36.0)
MCV: 93.6 fL (ref 80.0–100.0)
Platelets: 235 10*3/uL (ref 150–400)
RBC: 5.78 MIL/uL (ref 4.22–5.81)
RDW: 13 % (ref 11.5–15.5)
WBC: 5.7 10*3/uL (ref 4.0–10.5)
nRBC: 0 % (ref 0.0–0.2)

## 2020-01-09 LAB — COMPREHENSIVE METABOLIC PANEL
ALT: 23 U/L (ref 0–44)
AST: 17 U/L (ref 15–41)
Albumin: 4.1 g/dL (ref 3.5–5.0)
Alkaline Phosphatase: 63 U/L (ref 38–126)
Anion gap: 9 (ref 5–15)
BUN: 16 mg/dL (ref 6–20)
CO2: 24 mmol/L (ref 22–32)
Calcium: 9.3 mg/dL (ref 8.9–10.3)
Chloride: 108 mmol/L (ref 98–111)
Creatinine, Ser: 0.87 mg/dL (ref 0.61–1.24)
GFR calc Af Amer: >60 mL/min (ref >60)
GFR calc non Af Amer: >60 mL/min (ref >60)
Glucose, Bld: 161 mg/dL — ABNORMAL HIGH (ref 70–99)
Potassium: 4.1 mmol/L (ref 3.5–5.1)
Sodium: 141 mmol/L (ref 135–145)
Total Bilirubin: 0.5 mg/dL (ref 0.3–1.2)
Total Protein: 6.4 g/dL — ABNORMAL LOW (ref 6.5–8.1)

## 2020-01-09 LAB — SURGICAL PCR SCREEN
MRSA, PCR: NEGATIVE
Staphylococcus aureus: NEGATIVE

## 2020-01-09 LAB — TYPE AND SCREEN
ABO/RH(D): B POS
Antibody Screen: NEGATIVE

## 2020-01-09 LAB — HEMOGLOBIN A1C
Hgb A1c MFr Bld: 6.9 % — ABNORMAL HIGH (ref 4.8–5.6)
Mean Plasma Glucose: 151.33 mg/dL

## 2020-01-09 LAB — GLUCOSE, CAPILLARY: Glucose-Capillary: 175 mg/dL — ABNORMAL HIGH (ref 70–99)

## 2020-01-09 LAB — ABO/RH: ABO/RH(D): B POS

## 2020-01-09 NOTE — Progress Notes (Signed)
Surgical PCR has not yet resulted. PCR collected this morning @ 1100.

## 2020-01-09 NOTE — Progress Notes (Addendum)
PCP - Biagio Borg, MD Cardiologist - Denies  PPM/ICD - Denies  Chest x-ray - N/A EKG - 01/09/20 Stress Test - Denies  ECHO - Denies Cardiac Cath - Denies  Sleep Study - Yes, negative for OSA CPAP - N/A  Fasting Blood Sugar: Per patient ~150-200. Checks Blood Sugar "every couple months". CBG at PAT appointment was 175. A1C obtained.  Blood Thinner Instructions: N/A Aspirin Instructions: Per patient, last dose 01/08/20  ERAS Protcol - No PRE-SURGERY Ensure or G2- N/A  COVID TEST- 01/14/20 @ [id number] @ [facility]   Anesthesia review: No  Patient denies shortness of breath, fever, cough and chest pain at PAT appointment   All instructions explained to the patient, with a verbal understanding of the material. Patient agrees to go over the instructions while at home for a better understanding. Patient also instructed to self quarantine after being tested for COVID-19. The opportunity to ask questions was provided.

## 2020-01-14 ENCOUNTER — Other Ambulatory Visit (HOSPITAL_COMMUNITY)
Admission: RE | Admit: 2020-01-14 | Discharge: 2020-01-14 | Disposition: A | Discharge status: Home / Self Care | Payer: 59 | Source: Ambulatory Visit | Attending: Neurosurgery | Admitting: Neurosurgery

## 2020-01-14 DIAGNOSIS — Z20822 Contact with and (suspected) exposure to covid-19: Secondary | ICD-10-CM | POA: Insufficient documentation

## 2020-01-14 DIAGNOSIS — Z01812 Encounter for preprocedural laboratory examination: Secondary | ICD-10-CM | POA: Diagnosis not present

## 2020-01-14 LAB — SARS CORONAVIRUS 2 (TAT 6-24 HRS): SARS Coronavirus 2: NEGATIVE

## 2020-01-15 MED ORDER — DEXTROSE 5 % IV SOLN
3.0000 g | INTRAVENOUS | Status: AC
  Administered 2020-01-16: 3 g via INTRAVENOUS
  Filled 2020-01-15: qty 3

## 2020-01-16 ENCOUNTER — Ambulatory Visit (HOSPITAL_COMMUNITY): Payer: 59

## 2020-01-16 ENCOUNTER — Other Ambulatory Visit: Payer: Self-pay

## 2020-01-16 ENCOUNTER — Ambulatory Visit (HOSPITAL_COMMUNITY)
Admission: RE | Disposition: A | Discharge status: Home / Self Care | Payer: Self-pay | Source: Home / Self Care | Attending: Neurosurgery

## 2020-01-16 ENCOUNTER — Ambulatory Visit (HOSPITAL_COMMUNITY): Payer: 59 | Admitting: Anesthesiology

## 2020-01-16 ENCOUNTER — Observation Stay (HOSPITAL_COMMUNITY)
Admission: RE | Admit: 2020-01-16 | Discharge: 2020-01-17 | Disposition: A | Discharge status: Home / Self Care | Payer: 59 | Attending: Neurosurgery | Admitting: Neurosurgery

## 2020-01-16 ENCOUNTER — Encounter (HOSPITAL_COMMUNITY): Payer: Self-pay | Admitting: Neurosurgery

## 2020-01-16 DIAGNOSIS — M4802 Spinal stenosis, cervical region: Secondary | ICD-10-CM | POA: Diagnosis present

## 2020-01-16 DIAGNOSIS — R2681 Unsteadiness on feet: Secondary | ICD-10-CM | POA: Diagnosis not present

## 2020-01-16 DIAGNOSIS — I10 Essential (primary) hypertension: Secondary | ICD-10-CM | POA: Diagnosis not present

## 2020-01-16 DIAGNOSIS — Z419 Encounter for procedure for purposes other than remedying health state, unspecified: Secondary | ICD-10-CM

## 2020-01-16 DIAGNOSIS — G5603 Carpal tunnel syndrome, bilateral upper limbs: Secondary | ICD-10-CM | POA: Diagnosis not present

## 2020-01-16 DIAGNOSIS — Z885 Allergy status to narcotic agent status: Secondary | ICD-10-CM | POA: Diagnosis not present

## 2020-01-16 DIAGNOSIS — Z79899 Other long term (current) drug therapy: Secondary | ICD-10-CM | POA: Diagnosis not present

## 2020-01-16 DIAGNOSIS — F17200 Nicotine dependence, unspecified, uncomplicated: Secondary | ICD-10-CM | POA: Insufficient documentation

## 2020-01-16 DIAGNOSIS — Z7984 Long term (current) use of oral hypoglycemic drugs: Secondary | ICD-10-CM | POA: Diagnosis not present

## 2020-01-16 DIAGNOSIS — M50122 Cervical disc disorder at C5-C6 level with radiculopathy: Secondary | ICD-10-CM | POA: Diagnosis not present

## 2020-01-16 DIAGNOSIS — M50123 Cervical disc disorder at C6-C7 level with radiculopathy: Secondary | ICD-10-CM | POA: Diagnosis not present

## 2020-01-16 DIAGNOSIS — M50121 Cervical disc disorder at C4-C5 level with radiculopathy: Secondary | ICD-10-CM | POA: Insufficient documentation

## 2020-01-16 HISTORY — PX: ANTERIOR CERVICAL DECOMP/DISCECTOMY FUSION: SHX1161

## 2020-01-16 HISTORY — PX: CARPAL TUNNEL RELEASE: SHX101

## 2020-01-16 LAB — GLUCOSE, CAPILLARY
Glucose-Capillary: 141 mg/dL — ABNORMAL HIGH (ref 70–99)
Glucose-Capillary: 128 mg/dL — ABNORMAL HIGH (ref 70–99)
Glucose-Capillary: 160 mg/dL — ABNORMAL HIGH (ref 70–99)
Glucose-Capillary: 219 mg/dL — ABNORMAL HIGH (ref 70–99)

## 2020-01-16 SURGERY — ANTERIOR CERVICAL DECOMPRESSION/DISCECTOMY FUSION 3 LEVELS
Anesthesia: General

## 2020-01-16 MED ORDER — LIDOCAINE HCL 1 % IJ SOLN
INTRAMUSCULAR | Status: AC
  Filled 2020-01-16: qty 20

## 2020-01-16 MED ORDER — ONDANSETRON HCL 4 MG PO TABS: 4.0000 mg | ORAL_TABLET | Freq: Four times a day (QID) | ORAL | Status: DC | PRN

## 2020-01-16 MED ORDER — PANTOPRAZOLE SODIUM 40 MG PO TBEC
40.0000 mg | DELAYED_RELEASE_TABLET | Freq: Every day | ORAL | Status: DC
  Administered 2020-01-16: 40 mg via ORAL
  Filled 2020-01-16: qty 1

## 2020-01-16 MED ORDER — PROPOFOL 10 MG/ML IV BOLUS
INTRAVENOUS | Status: AC
  Filled 2020-01-16: qty 20

## 2020-01-16 MED ORDER — DAPAGLIFLOZIN PROPANEDIOL 10 MG PO TABS
10.0000 mg | ORAL_TABLET | Freq: Every day | ORAL | Status: DC
  Administered 2020-01-17: 10 mg via ORAL
  Filled 2020-01-16: qty 1

## 2020-01-16 MED ORDER — ATORVASTATIN CALCIUM 80 MG PO TABS
80.0000 mg | ORAL_TABLET | Freq: Every day | ORAL | Status: DC
  Administered 2020-01-16 – 2020-01-17 (×2): 80 mg via ORAL
  Filled 2020-01-16 (×2): qty 1

## 2020-01-16 MED ORDER — CELECOXIB 200 MG PO CAPS
200.0000 mg | ORAL_CAPSULE | Freq: Once | ORAL | Status: AC
  Administered 2020-01-16: 200 mg via ORAL
  Filled 2020-01-16: qty 1

## 2020-01-16 MED ORDER — 0.9 % SODIUM CHLORIDE (POUR BTL) OPTIME
TOPICAL | Status: DC | PRN
  Administered 2020-01-16: 1000 mL

## 2020-01-16 MED ORDER — ONDANSETRON HCL 4 MG/2ML IJ SOLN
INTRAMUSCULAR | Status: AC
  Filled 2020-01-16: qty 2

## 2020-01-16 MED ORDER — POLYETHYLENE GLYCOL 3350 17 G PO PACK: 17.0000 g | PACK | Freq: Every day | ORAL | Status: DC | PRN

## 2020-01-16 MED ORDER — METHOCARBAMOL 1000 MG/10ML IJ SOLN
500.0000 mg | Freq: Four times a day (QID) | INTRAVENOUS | Status: DC | PRN
  Filled 2020-01-16: qty 5

## 2020-01-16 MED ORDER — ONDANSETRON HCL 4 MG/2ML IJ SOLN
INTRAMUSCULAR | Status: DC | PRN
  Administered 2020-01-16 (×2): 4 mg via INTRAVENOUS

## 2020-01-16 MED ORDER — METHOCARBAMOL 500 MG PO TABS
500.0000 mg | ORAL_TABLET | Freq: Four times a day (QID) | ORAL | Status: DC | PRN
  Administered 2020-01-17: 500 mg via ORAL
  Filled 2020-01-16: qty 1

## 2020-01-16 MED ORDER — SENNA 8.6 MG PO TABS
1.0000 | ORAL_TABLET | Freq: Every day | ORAL | Status: DC
  Administered 2020-01-16 – 2020-01-17 (×2): 8.6 mg via ORAL
  Filled 2020-01-16 (×2): qty 1

## 2020-01-16 MED ORDER — ROCURONIUM BROMIDE 10 MG/ML (PF) SYRINGE
PREFILLED_SYRINGE | INTRAVENOUS | Status: DC | PRN
  Administered 2020-01-16: 50 mg via INTRAVENOUS
  Administered 2020-01-16: 30 mg via INTRAVENOUS
  Administered 2020-01-16: 40 mg via INTRAVENOUS
  Administered 2020-01-16: 60 mg via INTRAVENOUS
  Administered 2020-01-16: 30 mg via INTRAVENOUS

## 2020-01-16 MED ORDER — SODIUM CHLORIDE 0.9 % IV SOLN: 250.0000 mL | INTRAVENOUS | Status: DC

## 2020-01-16 MED ORDER — FENTANYL CITRATE (PF) 100 MCG/2ML IJ SOLN
INTRAMUSCULAR | Status: AC
  Filled 2020-01-16: qty 2

## 2020-01-16 MED ORDER — MIDAZOLAM HCL 2 MG/2ML IJ SOLN
INTRAMUSCULAR | Status: AC
  Filled 2020-01-16: qty 2

## 2020-01-16 MED ORDER — CEFAZOLIN SODIUM-DEXTROSE 2-4 GM/100ML-% IV SOLN
2.0000 g | Freq: Three times a day (TID) | INTRAVENOUS | Status: AC
  Administered 2020-01-16 – 2020-01-17 (×2): 2 g via INTRAVENOUS
  Filled 2020-01-16 (×2): qty 100

## 2020-01-16 MED ORDER — CARISOPRODOL 350 MG PO TABS
350.0000 mg | ORAL_TABLET | Freq: Three times a day (TID) | ORAL | Status: DC
  Administered 2020-01-16 – 2020-01-17 (×3): 350 mg via ORAL
  Filled 2020-01-16 (×3): qty 1

## 2020-01-16 MED ORDER — ROCURONIUM BROMIDE 10 MG/ML (PF) SYRINGE
PREFILLED_SYRINGE | INTRAVENOUS | Status: AC
  Filled 2020-01-16: qty 10

## 2020-01-16 MED ORDER — AMLODIPINE BESYLATE 5 MG PO TABS
5.0000 mg | ORAL_TABLET | Freq: Every day | ORAL | Status: DC
  Administered 2020-01-16 – 2020-01-17 (×2): 5 mg via ORAL
  Filled 2020-01-16 (×2): qty 1

## 2020-01-16 MED ORDER — PROPOFOL 10 MG/ML IV BOLUS
INTRAVENOUS | Status: DC | PRN
  Administered 2020-01-16: 40 mg via INTRAVENOUS
  Administered 2020-01-16: 160 mg via INTRAVENOUS

## 2020-01-16 MED ORDER — VITAMIN D 25 MCG (1000 UNIT) PO TABS
2000.0000 [IU] | ORAL_TABLET | Freq: Every day | ORAL | Status: DC
  Administered 2020-01-16 – 2020-01-17 (×2): 2000 [IU] via ORAL
  Filled 2020-01-16 (×2): qty 2

## 2020-01-16 MED ORDER — CHLORHEXIDINE GLUCONATE CLOTH 2 % EX PADS: 6.0000 | MEDICATED_PAD | Freq: Once | CUTANEOUS | Status: DC

## 2020-01-16 MED ORDER — ACETAMINOPHEN 500 MG PO TABS
1000.0000 mg | ORAL_TABLET | Freq: Once | ORAL | Status: AC
  Administered 2020-01-16: 1000 mg via ORAL
  Filled 2020-01-16: qty 2

## 2020-01-16 MED ORDER — HYDROMORPHONE HCL 1 MG/ML IJ SOLN: 0.5000 mg | INTRAMUSCULAR | Status: DC | PRN

## 2020-01-16 MED ORDER — FENTANYL CITRATE (PF) 250 MCG/5ML IJ SOLN
INTRAMUSCULAR | Status: AC
  Filled 2020-01-16: qty 5

## 2020-01-16 MED ORDER — SODIUM CHLORIDE 0.9% FLUSH: 3.0000 mL | INTRAVENOUS | Status: DC | PRN

## 2020-01-16 MED ORDER — DOCUSATE SODIUM 100 MG PO CAPS
100.0000 mg | ORAL_CAPSULE | Freq: Two times a day (BID) | ORAL | Status: DC
  Administered 2020-01-16 – 2020-01-17 (×2): 100 mg via ORAL
  Filled 2020-01-16 (×2): qty 1

## 2020-01-16 MED ORDER — BISACODYL 10 MG RE SUPP: 10.0000 mg | Freq: Every day | RECTAL | Status: DC | PRN

## 2020-01-16 MED ORDER — FLEET ENEMA 7-19 GM/118ML RE ENEM: 1.0000 | ENEMA | Freq: Once | RECTAL | Status: DC | PRN

## 2020-01-16 MED ORDER — LIDOCAINE 2% (20 MG/ML) 5 ML SYRINGE
INTRAMUSCULAR | Status: AC
  Filled 2020-01-16: qty 5

## 2020-01-16 MED ORDER — LIDOCAINE HCL (PF) 1 % IJ SOLN
INTRAMUSCULAR | Status: DC | PRN
  Administered 2020-01-16: 5 mL

## 2020-01-16 MED ORDER — LIDOCAINE 2% (20 MG/ML) 5 ML SYRINGE
INTRAMUSCULAR | Status: DC | PRN
  Administered 2020-01-16: 60 mg via INTRAVENOUS

## 2020-01-16 MED ORDER — LIDOCAINE-EPINEPHRINE 1 %-1:100000 IJ SOLN
INTRAMUSCULAR | Status: AC
  Filled 2020-01-16: qty 1

## 2020-01-16 MED ORDER — HYDROCODONE-ACETAMINOPHEN 5-325 MG PO TABS
2.0000 | ORAL_TABLET | ORAL | Status: DC | PRN
  Administered 2020-01-16 – 2020-01-17 (×4): 2 via ORAL
  Filled 2020-01-16 (×4): qty 2

## 2020-01-16 MED ORDER — FENTANYL CITRATE (PF) 100 MCG/2ML IJ SOLN
25.0000 ug | INTRAMUSCULAR | Status: DC | PRN
  Administered 2020-01-16 (×4): 25 ug via INTRAVENOUS

## 2020-01-16 MED ORDER — BACITRACIN ZINC 500 UNIT/GM EX OINT
TOPICAL_OINTMENT | CUTANEOUS | Status: AC
  Filled 2020-01-16: qty 28.35

## 2020-01-16 MED ORDER — KCL IN DEXTROSE-NACL 20-5-0.45 MEQ/L-%-% IV SOLN: INTRAVENOUS | Status: DC

## 2020-01-16 MED ORDER — FENTANYL CITRATE (PF) 100 MCG/2ML IJ SOLN
INTRAMUSCULAR | Status: DC | PRN
  Administered 2020-01-16: 100 ug via INTRAVENOUS
  Administered 2020-01-16 (×2): 50 ug via INTRAVENOUS
  Administered 2020-01-16: 150 ug via INTRAVENOUS
  Administered 2020-01-16: 50 ug via INTRAVENOUS

## 2020-01-16 MED ORDER — THROMBIN 5000 UNITS EX SOLR
CUTANEOUS | Status: AC
  Filled 2020-01-16: qty 5000

## 2020-01-16 MED ORDER — ORAL CARE MOUTH RINSE: 15.0000 mL | Freq: Once | OROMUCOSAL | Status: AC

## 2020-01-16 MED ORDER — DEXAMETHASONE SODIUM PHOSPHATE 10 MG/ML IJ SOLN
INTRAMUSCULAR | Status: AC
  Filled 2020-01-16: qty 2

## 2020-01-16 MED ORDER — DEXAMETHASONE SODIUM PHOSPHATE 10 MG/ML IJ SOLN
INTRAMUSCULAR | Status: DC | PRN
  Administered 2020-01-16: 10 mg via INTRAVENOUS

## 2020-01-16 MED ORDER — BUPIVACAINE HCL (PF) 0.5 % IJ SOLN
INTRAMUSCULAR | Status: AC
  Filled 2020-01-16: qty 30

## 2020-01-16 MED ORDER — ASPIRIN EC 81 MG PO TBEC: 81.0000 mg | DELAYED_RELEASE_TABLET | Freq: Every evening | ORAL | Status: DC

## 2020-01-16 MED ORDER — PANTOPRAZOLE SODIUM 40 MG IV SOLR: 40.0000 mg | Freq: Every day | INTRAVENOUS | Status: DC

## 2020-01-16 MED ORDER — METFORMIN HCL 500 MG PO TABS
1000.0000 mg | ORAL_TABLET | Freq: Two times a day (BID) | ORAL | Status: DC
  Administered 2020-01-16 – 2020-01-17 (×2): 1000 mg via ORAL
  Filled 2020-01-16 (×2): qty 2

## 2020-01-16 MED ORDER — GLIPIZIDE ER 10 MG PO TB24
10.0000 mg | ORAL_TABLET | Freq: Every day | ORAL | Status: DC
  Administered 2020-01-17: 10 mg via ORAL
  Filled 2020-01-16: qty 1

## 2020-01-16 MED ORDER — MENTHOL 3 MG MT LOZG: 1.0000 | LOZENGE | OROMUCOSAL | Status: DC | PRN

## 2020-01-16 MED ORDER — BUPIVACAINE HCL (PF) 0.5 % IJ SOLN
INTRAMUSCULAR | Status: DC | PRN
  Administered 2020-01-16: 5 mL

## 2020-01-16 MED ORDER — GABAPENTIN 300 MG PO CAPS
300.0000 mg | ORAL_CAPSULE | Freq: Four times a day (QID) | ORAL | Status: DC
  Administered 2020-01-16 – 2020-01-17 (×3): 300 mg via ORAL
  Filled 2020-01-16 (×3): qty 1

## 2020-01-16 MED ORDER — MIDAZOLAM HCL 5 MG/5ML IJ SOLN
INTRAMUSCULAR | Status: DC | PRN
  Administered 2020-01-16 (×2): 1 mg via INTRAVENOUS

## 2020-01-16 MED ORDER — ACETAMINOPHEN 325 MG PO TABS: 650.0000 mg | ORAL_TABLET | ORAL | Status: DC | PRN

## 2020-01-16 MED ORDER — ALUM & MAG HYDROXIDE-SIMETH 200-200-20 MG/5ML PO SUSP: 30.0000 mL | Freq: Four times a day (QID) | ORAL | Status: DC | PRN

## 2020-01-16 MED ORDER — SUGAMMADEX SODIUM 200 MG/2ML IV SOLN
INTRAVENOUS | Status: DC | PRN
  Administered 2020-01-16: 200 mg via INTRAVENOUS

## 2020-01-16 MED ORDER — LACTATED RINGERS IV SOLN: INTRAVENOUS | Status: DC

## 2020-01-16 MED ORDER — PHENOL 1.4 % MT LIQD: 1.0000 | OROMUCOSAL | Status: DC | PRN

## 2020-01-16 MED ORDER — PROMETHAZINE HCL 25 MG/ML IJ SOLN: 6.2500 mg | INTRAMUSCULAR | Status: DC | PRN

## 2020-01-16 MED ORDER — ZOLPIDEM TARTRATE 5 MG PO TABS: 5.0000 mg | ORAL_TABLET | Freq: Every evening | ORAL | Status: DC | PRN

## 2020-01-16 MED ORDER — ACETAMINOPHEN 650 MG RE SUPP: 650.0000 mg | RECTAL | Status: DC | PRN

## 2020-01-16 MED ORDER — VENLAFAXINE HCL ER 37.5 MG PO CP24
37.5000 mg | ORAL_CAPSULE | Freq: Every day | ORAL | Status: DC
  Administered 2020-01-17: 37.5 mg via ORAL
  Filled 2020-01-16: qty 1

## 2020-01-16 MED ORDER — SODIUM CHLORIDE 0.9% FLUSH
3.0000 mL | Freq: Two times a day (BID) | INTRAVENOUS | Status: DC
  Administered 2020-01-16: 3 mL via INTRAVENOUS

## 2020-01-16 MED ORDER — ALPRAZOLAM 0.5 MG PO TABS
1.0000 mg | ORAL_TABLET | Freq: Two times a day (BID) | ORAL | Status: DC | PRN
  Administered 2020-01-16: 1 mg via ORAL
  Filled 2020-01-16: qty 2

## 2020-01-16 MED ORDER — CHLORHEXIDINE GLUCONATE 0.12 % MT SOLN
15.0000 mL | Freq: Once | OROMUCOSAL | Status: AC
  Administered 2020-01-16: 15 mL via OROMUCOSAL
  Filled 2020-01-16: qty 15

## 2020-01-16 MED ORDER — DEXAMETHASONE SODIUM PHOSPHATE 10 MG/ML IJ SOLN
INTRAMUSCULAR | Status: AC
  Filled 2020-01-16: qty 1

## 2020-01-16 MED ORDER — ROCURONIUM BROMIDE 10 MG/ML (PF) SYRINGE
PREFILLED_SYRINGE | INTRAVENOUS | Status: AC
  Filled 2020-01-16: qty 20

## 2020-01-16 MED ORDER — PIOGLITAZONE HCL 45 MG PO TABS
45.0000 mg | ORAL_TABLET | Freq: Every day | ORAL | Status: DC
  Administered 2020-01-16 – 2020-01-17 (×2): 45 mg via ORAL
  Filled 2020-01-16 (×2): qty 1

## 2020-01-16 MED ORDER — THROMBIN 5000 UNITS EX SOLR: OROMUCOSAL | Status: DC | PRN

## 2020-01-16 MED ORDER — OXYCODONE HCL 5 MG PO TABS: 5.0000 mg | ORAL_TABLET | ORAL | Status: DC | PRN

## 2020-01-16 MED ORDER — LIDOCAINE-EPINEPHRINE 1 %-1:100000 IJ SOLN
INTRAMUSCULAR | Status: DC | PRN
  Administered 2020-01-16: 5 mL

## 2020-01-16 MED ORDER — ONDANSETRON HCL 4 MG/2ML IJ SOLN: 4.0000 mg | Freq: Four times a day (QID) | INTRAMUSCULAR | Status: DC | PRN

## 2020-01-16 SURGICAL SUPPLY — 87 items
ADH SKN CLS APL DERMABOND .7 (GAUZE/BANDAGES/DRESSINGS) ×2
BAND INSRT 18 STRL LF DISP RB (MISCELLANEOUS) ×4
BAND RUBBER #18 3X1/16 STRL (MISCELLANEOUS) ×8 IMPLANT
BASKET BONE COLLECTION (BASKET) IMPLANT
BIT DRILL 14X2.5XNS TI ANT (BIT) IMPLANT
BIT DRILL AVIATOR 14 (BIT) ×3
BIT DRILL AVIATOR 14MM (BIT) ×1
BIT DRILL NEURO 2X3.1 SFT TUCH (MISCELLANEOUS) ×2 IMPLANT
BIT DRL 14X2.5XNS TI ANT (BIT) ×2
BLADE SURG 15 STRL LF DISP TIS (BLADE) ×2 IMPLANT
BLADE SURG 15 STRL SS (BLADE) ×4
BLADE ULTRA TIP 2M (BLADE) IMPLANT
BNDG CMPR 75X41 PLY ABS (GAUZE/BANDAGES/DRESSINGS) ×2
BNDG CMPR 75X41 PLY HI ABS (GAUZE/BANDAGES/DRESSINGS) ×2
BNDG GAUZE ELAST 4 BULKY (GAUZE/BANDAGES/DRESSINGS) ×4 IMPLANT
BNDG STRETCH 4X75 NS LF (GAUZE/BANDAGES/DRESSINGS) ×2 IMPLANT
BNDG STRETCH 4X75 STRL LF (GAUZE/BANDAGES/DRESSINGS) ×4 IMPLANT
BUR BARREL STRAIGHT FLUTE 4.0 (BURR) ×6 IMPLANT
CABLE BIPOLOR RESECTION CORD (MISCELLANEOUS) ×4 IMPLANT
CANISTER SUCT 3000ML PPV (MISCELLANEOUS) ×4 IMPLANT
CARTRIDGE OIL MAESTRO DRILL (MISCELLANEOUS) ×2 IMPLANT
COVER MAYO STAND STRL (DRAPES) ×4 IMPLANT
DECANTER SPIKE VIAL GLASS SM (MISCELLANEOUS) ×4 IMPLANT
DERMABOND ADVANCED (GAUZE/BANDAGES/DRESSINGS) ×2
DERMABOND ADVANCED .7 DNX12 (GAUZE/BANDAGES/DRESSINGS) ×2 IMPLANT
DIFFUSER DRILL AIR PNEUMATIC (MISCELLANEOUS) ×4 IMPLANT
DRAPE EXTREMITY T 121X128X90 (DISPOSABLE) ×4 IMPLANT
DRAPE HALF SHEET 40X57 (DRAPES) ×4 IMPLANT
DRAPE LAPAROTOMY 100X72 PEDS (DRAPES) ×4 IMPLANT
DRAPE MICROSCOPE LEICA (MISCELLANEOUS) ×4 IMPLANT
DRILL NEURO 2X3.1 SOFT TOUCH (MISCELLANEOUS) ×4
DRSG ADAPTIC 3X8 NADH LF (GAUZE/BANDAGES/DRESSINGS) ×2 IMPLANT
DRSG EMULSION OIL 3X3 NADH (GAUZE/BANDAGES/DRESSINGS) ×4 IMPLANT
DRSG OPSITE POSTOP 4X8 (GAUZE/BANDAGES/DRESSINGS) ×2 IMPLANT
DURAPREP 6ML APPLICATOR 50/CS (WOUND CARE) ×4 IMPLANT
ELECT COATED BLADE 2.86 ST (ELECTRODE) ×4 IMPLANT
ELECT REM PT RETURN 9FT ADLT (ELECTROSURGICAL) ×4
ELECTRODE REM PT RTRN 9FT ADLT (ELECTROSURGICAL) ×2 IMPLANT
GAUZE 4X4 16PLY RFD (DISPOSABLE) ×4 IMPLANT
GAUZE SPONGE 4X4 12PLY STRL (GAUZE/BANDAGES/DRESSINGS) ×4 IMPLANT
GAUZE SPONGE 4X4 12PLY STRL LF (GAUZE/BANDAGES/DRESSINGS) ×2 IMPLANT
GLOVE BIO SURGEON STRL SZ8 (GLOVE) ×8 IMPLANT
GLOVE BIOGEL PI IND STRL 8 (GLOVE) ×2 IMPLANT
GLOVE BIOGEL PI IND STRL 8.5 (GLOVE) ×4 IMPLANT
GLOVE BIOGEL PI INDICATOR 8 (GLOVE) ×2
GLOVE BIOGEL PI INDICATOR 8.5 (GLOVE) ×4
GLOVE ECLIPSE 8.0 STRL XLNG CF (GLOVE) ×4 IMPLANT
GLOVE EXAM NITRILE XL STR (GLOVE) IMPLANT
GOWN STRL REUS W/ TWL LRG LVL3 (GOWN DISPOSABLE) ×2 IMPLANT
GOWN STRL REUS W/ TWL XL LVL3 (GOWN DISPOSABLE) ×2 IMPLANT
GOWN STRL REUS W/TWL 2XL LVL3 (GOWN DISPOSABLE) ×8 IMPLANT
GOWN STRL REUS W/TWL LRG LVL3 (GOWN DISPOSABLE) ×12
GOWN STRL REUS W/TWL XL LVL3 (GOWN DISPOSABLE) ×16
HALTER HD/CHIN CERV TRACTION D (MISCELLANEOUS) ×4 IMPLANT
HEMOSTAT POWDER KIT SURGIFOAM (HEMOSTASIS) ×4 IMPLANT
KIT BASIN OR (CUSTOM PROCEDURE TRAY) ×4 IMPLANT
KIT TURNOVER KIT B (KITS) ×8 IMPLANT
NDL HYPO 25X1 1.5 SAFETY (NEEDLE) ×4 IMPLANT
NDL SPNL 18GX3.5 QUINCKE PK (NEEDLE) IMPLANT
NDL SPNL 22GX3.5 QUINCKE BK (NEEDLE) ×2 IMPLANT
NEEDLE HYPO 25X1 1.5 SAFETY (NEEDLE) ×8 IMPLANT
NEEDLE SPNL 18GX3.5 QUINCKE PK (NEEDLE) ×4 IMPLANT
NEEDLE SPNL 22GX3.5 QUINCKE BK (NEEDLE) ×4 IMPLANT
NS IRRIG 1000ML POUR BTL (IV SOLUTION) ×4 IMPLANT
OIL CARTRIDGE MAESTRO DRILL (MISCELLANEOUS) ×4
PACK LAMINECTOMY NEURO (CUSTOM PROCEDURE TRAY) ×4 IMPLANT
PACK SURGICAL SETUP 50X90 (CUSTOM PROCEDURE TRAY) ×4 IMPLANT
PEEK SPACER AVS AS 6X14X16 4D (Cage) ×4 IMPLANT
PEEK SPACER AVS AS 7X14X16X4% (Peek) ×2 IMPLANT
PIN DISTRACTION 14MM (PIN) ×8 IMPLANT
PLATE AVIATOR ASSY 3LVL SZ 48 (Plate) ×2 IMPLANT
PUTTY DBX 1CC (Putty) ×4 IMPLANT
PUTTY DBX 1CC DEPUY (Putty) IMPLANT
SCREW AVIATOR VAR SELFTAP 4X14 (Screw) ×16 IMPLANT
SPONGE INTESTINAL PEANUT (DISPOSABLE) ×4 IMPLANT
SPONGE SURGIFOAM ABS GEL 100 (HEMOSTASIS) ×4 IMPLANT
STAPLER SKIN PROX WIDE 3.9 (STAPLE) IMPLANT
STOCKINETTE 4X48 STRL (DRAPES) ×4 IMPLANT
SUT ETHILON 3 0 PS 1 (SUTURE) ×4 IMPLANT
SUT VIC AB 3-0 SH 8-18 (SUTURE) ×6 IMPLANT
SYR BULB EAR ULCER 3OZ GRN STR (SYRINGE) ×4 IMPLANT
SYR CONTROL 10ML LL (SYRINGE) ×4 IMPLANT
TOWEL GREEN STERILE (TOWEL DISPOSABLE) ×8 IMPLANT
TOWEL GREEN STERILE FF (TOWEL DISPOSABLE) ×8 IMPLANT
TRAY FOLEY MTR SLVR 16FR STAT (SET/KITS/TRAYS/PACK) IMPLANT
UNDERPAD 30X36 HEAVY ABSORB (UNDERPADS AND DIAPERS) ×4 IMPLANT
WATER STERILE IRR 1000ML POUR (IV SOLUTION) ×4 IMPLANT

## 2020-01-16 NOTE — Progress Notes (Signed)
Pt OOB to chair with 2 person assist

## 2020-01-16 NOTE — Progress Notes (Signed)
Orthopedic Tech Progress Note Patient Details:  Jeffrey [name] 1965-08-29 [id number] Patient has collar Patient ID: Jeffrey [name], male   DOB: 1965-12-31, 54 y.o.   MRN: [id number]   Chip Boer 01/16/2020, 8:06 PM

## 2020-01-16 NOTE — Brief Op Note (Signed)
01/16/2020  4:04 PM  PATIENT:  Jeffrey [name]  54 y.o. male  PRE-OPERATIVE DIAGNOSIS:  Cervical stenosis of spinal canal, Bilateral carpal tunnel syndrome,herniated cervical disc, cervicalgia, radiculopathy C 45, C 56, C 67 levels, left carpal tunnel syndrome  POST-OPERATIVE DIAGNOSIS:   Cervical stenosis of spinal canal, Bilateral carpal tunnel syndrome,herniated cervical disc, cervicalgia, radiculopathy C 45, C 56, C 67 levels, left carpal tunnel syndrome   PROCEDURE:  Procedure(s) with comments: Cervical 4-5 Cervical 5-6 Cervical 6-7 Anterior cervical decompression/discectomy/fusion with PEEK cages, autograft, plate LEFT CARPAL TUNNEL RELEASE (Left) - 3C  SURGEON:  Surgeon(s) and Role:    Erline Levine, MD - Primary  PHYSICIAN ASSISTANT: Arnoldo Morale, MD  ASSISTANTS: P[place], RN   Glenford Peers, NP   ANESTHESIA:   general  EBL:  135 mL   BLOOD ADMINISTERED:none  DRAINS: (#10) Jackson-Pratt drain(s) with closed bulb suction in the prevertebral space   LOCAL MEDICATIONS USED:  MARCAINE    and XYLOCAINE   SPECIMEN:  No Specimen  DISPOSITION OF SPECIMEN:  N/A  COUNTS:  YES  TOURNIQUET:  * No tourniquets in log *  DICTATION: Patient was brought to operating room and following the smooth and uncomplicated induction of general endotracheal anesthesia his head was placed on a horseshoe head holder he was placed in 5 pounds of Holter traction and his anterior neck was prepped and draped in usual sterile fashion. An incision was made on the left side of midline after infiltrating the skin and subcutaneous tissues with local lidocaine. The platysmal layer was incised and subplatysmal dissection was performed exposing the anterior border sternocleidomastoid muscle. Using blunt dissection the carotid sheath was kept lateral and trachea and esophagus kept medial exposing the anterior cervical spine. A bent spinal needle was placed it was felt to be the C 34 and C4-5 levels and this was  confirmed on intraoperative x-ray. Longus coli muscles were taken down from the anterior cervical spine using electrocautery and key elevator and self-retaining retractor was placed. The interspace at C 45 was incised and a thorough discectomy was performed. Distraction pins were placed. Uncinate spurs and central spondylitic ridges were drilled down with a high-speed drill. The spinal cord dura and both C 5 nerve roots were widely decompressed. Hemostasis was assured. After trial sizing a 6 mm peek interbody cage was selected and packed with local autograft. The graft was tamped into position and countersunk appropriately. The retractor was moved and the interspace at C 67 was incised and a thorough discectomy was performed. Distraction pins were placed. Uncinate spurs and central spondylitic ridges were drilled down with a high-speed drill. The spinal cord dura and both C 7 nerve roots were widely decompressed. Hemostasis was assured. After trial sizing a 7 mm peek interbody cage was selected and packed with local autograft. The graft was tamped into position and countersunk appropriately.The interspace at C 56 was incised and a thorough discectomy was performed. Distraction pins were placed. Uncinate spurs and central spondylitic ridges were drilled down with a high-speed drill. The spinal cord dura and both C 6 nerve roots were widely decompressed.  Hemostasis was assured. After trial sizing an 6 mm peek interbody cage was selected and packed with local autograft. The graft was tamped into position and countersunk appropriately.  Distraction weight was removed. A 48 mm Aviator anterior cervical plate was affixed to the cervical spine with 14 mm variable-angle screws 2 at C4, 2 at C5, 2 at C6,  and 2 at C7. All screws  were well-positioned and locking mechanisms were engaged. Soft tissues were inspected and found to be in good repair. The wound was irrigated. A final x-ray was obtained with good visualization at  C4 with the interbody graft partially visualized. A # 10 JP drain was inserted through a separate stab incision.   The platysma layer was closed with 3-0 Vicryl stitches and the skin was reapproximated with 3-0 Vicryl subcuticular stitches. The wound was dressed with Dermabond. Counts were correct at the end of the case.    His left hand and arm were prepped and draped with betadine scrub and paint and sterile stockinet.  Left wrist was infiltrated with lidocaine and an incision was made over a length of 2 cm in line with the fourth ray.  The flexor retinaculum was incised and the carpal tunnel was decompressed.  Decompression was carried into the volar wrist and into the distal palm.  Hemostasis was assured. The wound was irrigated and closed with 3-0 nylon vertical mattress stitches and dressed with a sterile occlusive dressing with Adaptic, fluff gauze, Kerlix and cling wrap. Patient was extubated and taken to recovery in stable and satisfactory condition.    PLAN OF CARE: Admit for overnight observation  PATIENT DISPOSITION:  PACU - hemodynamically stable.   Delay start of Pharmacological VTE agent (>24hrs) due to surgical blood loss or risk of bleeding: yes

## 2020-01-16 NOTE — Interval H&P Note (Signed)
History and Physical Interval Note:  01/16/2020 11:23 AM  Jeffrey [name]  has presented today for surgery, with the diagnosis of Cervical stenosis of spinal canal, Bilateral carpal tunnel syndrome.  The various methods of treatment have been discussed with the patient and family. After consideration of risks, benefits and other options for treatment, the patient has consented to  Procedure(s) with comments: Cervical 4-5 Cervical 5-6 Cervical 6-7 Anterior cervical decompression/discectomy/fusion (N/A) - 3C LEFT CARPAL TUNNEL RELEASE (Left) - 3C as a surgical intervention.  The patient's history has been reviewed, patient examined, no change in status, stable for surgery.  I have reviewed the patient's chart and labs.  Questions were answered to the patient's satisfaction.     Peggyann Shoals

## 2020-01-16 NOTE — H&P (Signed)
Patient ID:   [id number] Patient: Jeffrey [name]  Date of Birth: 1966/01/08 Visit Type: Office Visit   Date: 12/25/2019 11:00 AM Provider: Marchia Meiers. Vertell Limber MD   This 54 year old male presents for MRI Review/neck pain.  HISTORY OF PRESENT ILLNESS: 1.  MRI Review/neck pain  the patient returns today and we are reviewing his cervical MRI and his physical examination.  He says that he is still having lots of left arm pain.  He has also been diagnosed with significant carpal tunnel syndrome on both sides and has had shots which have some of his arm pain but he is still having a lot of numbness in his hands.  He describes that his EMG testing shows severe bilateral carpal syndrome.    His cervical MRI shows significant degenerative changes at the C4-5, C5-6, C6-7 levels.    His examination is consistent with persistent left arm pain with positive Spurling to the left, left biceps strength at 4/5, left wrist extension strength at 4/5, left triceps strength at 4/5, left wrist flexion strength at 4/5.  The patient has bilaterally positive Tinel's Phalen signs the wrists.    He is working on weight control and has lost 22 lb.  His weight is currently 280 lb.  Says this neck pain has been going on for a long time is very tired  of living with it   the patient has been advised to stop smoking.      Medical/Surgical/Interim History Reviewed, no change.  Last detailed document date:08/26/2019.     PAST MEDICAL HISTORY, SURGICAL HISTORY, FAMILY HISTORY, SOCIAL HISTORY AND REVIEW OF SYSTEMS I have reviewed the patient's past medical, surgical, family and social history as well as the comprehensive review of systems as included on the [facility] history form dated 08/26/2019, which I have signed.  Family History: Reviewed, no changes.  Last detailed document date:08/26/2019.   Social History: Reviewed, no changes. Last detailed document date: 08/26/2019.     MEDICATIONS: (added, continued or stopped this visit) Started Medication Directions Instruction Stopped  alprazolam 1 mg tablet take 1 tablet by oral route 3 times every day    amlodipine 5 mg tablet     atorvastatin 40 mg tablet take 1 tablet by oral route  every day    carisoprodol 350 mg tablet take 1 by oral route 2 times every day    gabapentin 300 mg capsule take 1 capsule by oral route 3 times every day    glipizide 10 mg tablet take 1 tablet by oral route 2 times every day before a meal    lisinopril 40 mg tablet take 1 tablet by oral route  every day    metformin 500 mg tablet take 1 tablet by oral route 2 times every day with morning and evening meals    pioglitazone 45 mg tablet take 1 tablet by oral route  every day    venlafaxine 37.5 mg tablet       ALLERGIES: Ingredient Reaction Medication Name Comment CODEINE itching    Reviewed, no changes.    PHYSICAL EXAM:  Vitals Date Temp F BP Pulse Ht In Wt Lb BMI BSA Pain Score 12/25/2019  156/89 84 71 280 39.05  5/10     IMPRESSION:    Severe bilateral carpal tunnel syndrome and severe spondylosis and cervical stenosis with multilevel radiculopathies  PLAN:  ACDF C4-5, C5-6, C6-7 with bilateral carpal tunnel release.  This will be staged and the left side  will be done 1st because of the severity of his pain.  Preoperative education was performed and the patient was fitted for a cervical collar.  Surgery is be scheduled for  January 16, 2020  Orders: Diagnostic Procedures: Assessment Procedure M54.12 Cervical Spine- Lateral Instruction(s)/Education: Assessment Instruction I10 Lifestyle education Z68.39 Dietary management education, guidance, and counseling  Completed Orders (this encounter) Order Details Reason Side Interpretation Result Initial Treatment Date Region Lifestyle education Patient will follow up with Primary Care  Physician.       Dietary management education, guidance, and counseling Encouraged patient to eat well balanced diet.        Assessment/Plan  # Detail Type Description  1. Assessment Bilateral carpal tunnel syndrome (G56.03).     2. Assessment Herniated nucleus pulposus, C5-6 (M50.222).     3. Assessment Herniated nucleus pulposus, C6-7 (M50.223).     4. Assessment Radiculopathy, cervical region (M54.12).     5. Assessment Cervical stenosis of spinal canal (M48.02).     6. Assessment Essential (primary) hypertension (I10).     7. Assessment Body mass index (BMI) 39.0-39.9, adult (Z68.39).  Plan Orders Today's instructions / counseling include(s) Dietary management education, guidance, and counseling. Clinical information/comments: Encouraged patient to eat well balanced diet.       Pain Management Plan Pain Scale: 5/10. Method: Numeric Pain Intensity Scale. Location: neck. Onset: 09/07/2007. Duration: varies. Quality: discomforting. Pain management follow-up plan of care: Patient will continue medication management..              Provider:  Marchia Meiers. Vertell Limber MD  12/27/2019 07:09 AM    Dictation edited by: Marchia Meiers. Vertell Limber    CC Providers: Cathlean Cower [facility] [place],  [place]  [phone]   Hulan Saas  [address] [place], [place] [address]               Electronically signed by Marchia Meiers. Vertell Limber MD on 12/27/2019 07:09 AM

## 2020-01-16 NOTE — Anesthesia Preprocedure Evaluation (Addendum)
Anesthesia Evaluation  Patient identified by MRN, date of birth, ID band Patient awake    Reviewed: Allergy & Precautions, NPO status , Patient's Chart, lab work & pertinent test results  History of Anesthesia Complications (+) history of anesthetic complications  Airway Mallampati: II  TM Distance: >3 FB Neck ROM: Full    Dental no notable dental hx. (+) Dental Advisory Given   Pulmonary Current Smoker and Patient abstained from smoking.,    Pulmonary exam normal        Cardiovascular hypertension, Pt. on medications Normal cardiovascular exam     Neuro/Psych PSYCHIATRIC DISORDERS Anxiety Depression    GI/Hepatic Neg liver ROS, PUD, GERD  Controlled,  Endo/Other  negative endocrine ROSdiabetes  Renal/GU negative Renal ROS     Musculoskeletal negative musculoskeletal ROS (+)   Abdominal   Peds  Hematology negative hematology ROS (+)   Anesthesia Other Findings   Reproductive/Obstetrics                           Anesthesia Physical Anesthesia Plan  ASA: II  Anesthesia Plan: General   Post-op Pain Management:    Induction: Intravenous  PONV Risk Score and Plan: 2 and Ondansetron, Dexamethasone and Midazolam  Airway Management Planned: Oral ETT  Additional Equipment:   Intra-op Plan:   Post-operative Plan: Extubation in OR  Informed Consent: I have reviewed the patients History and Physical, chart, labs and discussed the procedure including the risks, benefits and alternatives for the proposed anesthesia with the patient or authorized representative who has indicated his/her understanding and acceptance.     Dental advisory given  Plan Discussed with: Anesthesiologist  Anesthesia Plan Comments:        Anesthesia Quick Evaluation

## 2020-01-16 NOTE — Op Note (Signed)
01/16/2020  4:04 PM  PATIENT:  Jeffrey [name]  54 y.o. male  PRE-OPERATIVE DIAGNOSIS:  Cervical stenosis of spinal canal, Bilateral carpal tunnel syndrome,herniated cervical disc, cervicalgia, radiculopathy C 45, C 56, C 67 levels, left carpal tunnel syndrome  POST-OPERATIVE DIAGNOSIS:   Cervical stenosis of spinal canal, Bilateral carpal tunnel syndrome,herniated cervical disc, cervicalgia, radiculopathy C 45, C 56, C 67 levels, left carpal tunnel syndrome   PROCEDURE:  Procedure(s) with comments: Cervical 4-5 Cervical 5-6 Cervical 6-7 Anterior cervical decompression/discectomy/fusion with PEEK cages, autograft, plate LEFT CARPAL TUNNEL RELEASE (Left) - 3C  SURGEON:  Surgeon(s) and Role:    Erline Levine, MD - Primary  PHYSICIAN ASSISTANT: Arnoldo Morale, MD  ASSISTANTS: Poteat, RN   Glenford Peers, NP   ANESTHESIA:   general  EBL:  135 mL   BLOOD ADMINISTERED:none  DRAINS: (#10) Jackson-Pratt drain(s) with closed bulb suction in the prevertebral space   LOCAL MEDICATIONS USED:  MARCAINE    and XYLOCAINE   SPECIMEN:  No Specimen  DISPOSITION OF SPECIMEN:  N/A  COUNTS:  YES  TOURNIQUET:  * No tourniquets in log *  DICTATION: Patient was brought to operating room and following the smooth and uncomplicated induction of general endotracheal anesthesia his head was placed on a horseshoe head holder he was placed in 5 pounds of Holter traction and his anterior neck was prepped and draped in usual sterile fashion. An incision was made on the left side of midline after infiltrating the skin and subcutaneous tissues with local lidocaine. The platysmal layer was incised and subplatysmal dissection was performed exposing the anterior border sternocleidomastoid muscle. Using blunt dissection the carotid sheath was kept lateral and trachea and esophagus kept medial exposing the anterior cervical spine. A bent spinal needle was placed it was felt to be the C 34 and C4-5 levels and this was  confirmed on intraoperative x-ray. Longus coli muscles were taken down from the anterior cervical spine using electrocautery and key elevator and self-retaining retractor was placed. The interspace at C 45 was incised and a thorough discectomy was performed. Distraction pins were placed. Uncinate spurs and central spondylitic ridges were drilled down with a high-speed drill. The spinal cord dura and both C 5 nerve roots were widely decompressed. Hemostasis was assured. After trial sizing a 6 mm peek interbody cage was selected and packed with local autograft. The graft was tamped into position and countersunk appropriately. The retractor was moved and the interspace at C 67 was incised and a thorough discectomy was performed. Distraction pins were placed. Uncinate spurs and central spondylitic ridges were drilled down with a high-speed drill. The spinal cord dura and both C 7 nerve roots were widely decompressed. Hemostasis was assured. After trial sizing a 7 mm peek interbody cage was selected and packed with local autograft. The graft was tamped into position and countersunk appropriately.The interspace at C 56 was incised and a thorough discectomy was performed. Distraction pins were placed. Uncinate spurs and central spondylitic ridges were drilled down with a high-speed drill. The spinal cord dura and both C 6 nerve roots were widely decompressed.  Hemostasis was assured. After trial sizing an 6 mm peek interbody cage was selected and packed with local autograft. The graft was tamped into position and countersunk appropriately.  Distraction weight was removed. A 48 mm Aviator anterior cervical plate was affixed to the cervical spine with 14 mm variable-angle screws 2 at C4, 2 at C5, 2 at C6,  and 2 at C7. All screws  were well-positioned and locking mechanisms were engaged. Soft tissues were inspected and found to be in good repair. The wound was irrigated. A final x-ray was obtained with good visualization at  C4 with the interbody graft partially visualized. A # 10 JP drain was inserted through a separate stab incision.   The platysma layer was closed with 3-0 Vicryl stitches and the skin was reapproximated with 3-0 Vicryl subcuticular stitches. The wound was dressed with Dermabond. Counts were correct at the end of the case.    His left hand and arm were prepped and draped with betadine scrub and paint and sterile stockinet.  Left wrist was infiltrated with lidocaine and an incision was made over a length of 2 cm in line with the fourth ray.  The flexor retinaculum was incised and the carpal tunnel was decompressed.  Decompression was carried into the volar wrist and into the distal palm.  Hemostasis was assured. The wound was irrigated and closed with 3-0 nylon vertical mattress stitches and dressed with a sterile occlusive dressing with Adaptic, fluff gauze, Kerlix and cling wrap. Patient was extubated and taken to recovery in stable and satisfactory condition.    PLAN OF CARE: Admit for overnight observation  PATIENT DISPOSITION:  PACU - hemodynamically stable.   Delay start of Pharmacological VTE agent (>24hrs) due to surgical blood loss or risk of bleeding: yes

## 2020-01-16 NOTE — Progress Notes (Signed)
Awake, alert, conversant.  MAEW with good power.  Expected discomfort between shoulders.  Arms feel much better.  Patient is doing well.

## 2020-01-16 NOTE — Transfer of Care (Signed)
Immediate Anesthesia Transfer of Care Note  Patient: Jeffrey [name]  Procedure(s) Performed: Cervical 4-5 Cervical 5-6 Cervical 6-7 Anterior cervical decompression/discectomy/fusion (N/A ) LEFT CARPAL TUNNEL RELEASE (Left )  Patient Location: PACU  Anesthesia Type:General  Level of Consciousness: awake, alert , oriented and patient cooperative  Airway & Oxygen Therapy: Patient Spontanous Breathing and Patient connected to face mask oxygen  Post-op Assessment: Report given to RN and Post -op Vital signs reviewed and stable  Post vital signs: Reviewed and stable  Last Vitals:  Vitals Value Taken Time  BP 165/101 01/16/20 1536  Temp    Pulse 96 01/16/20 1538  Resp 21 01/16/20 1538  SpO2 100 % 01/16/20 1538  Vitals shown include unvalidated device data.  Last Pain:  Vitals:   01/16/20 1020  TempSrc:   PainSc: 0-No pain      Patients Stated Pain Goal: 2 ([id number])  Complications: No apparent anesthesia complications

## 2020-01-17 ENCOUNTER — Encounter: Payer: Self-pay | Admitting: *Deleted

## 2020-01-17 DIAGNOSIS — M4802 Spinal stenosis, cervical region: Secondary | ICD-10-CM | POA: Diagnosis not present

## 2020-01-17 LAB — GLUCOSE, CAPILLARY: Glucose-Capillary: 179 mg/dL — ABNORMAL HIGH (ref 70–99)

## 2020-01-17 MED ORDER — HYDROCODONE-ACETAMINOPHEN 5-325 MG PO TABS: 1.0000 | ORAL_TABLET | ORAL | 0 refills | Status: DC | PRN

## 2020-01-17 MED FILL — Thrombin For Soln 5000 Unit: CUTANEOUS | Qty: 5000 | Status: AC

## 2020-01-17 NOTE — Plan of Care (Signed)
Patient alert and oriented, mae's well, voiding adequate amount of urine, swallowing without difficulty, no c/o pain at time of discharge. Patient discharged home with family. Script and discharged instructions given to patient. Patient and family stated understanding of instructions given. Patient has an appointment with Dr. Vertell Limber in 1 week

## 2020-01-17 NOTE — Discharge Summary (Signed)
Physician Discharge Summary  Patient ID: Jeffrey [name] MRN: [id number] DOB/AGE: [birth year] 54 y.o.  Admit date: 01/16/2020 Discharge date: 01/17/2020  Admission Diagnoses:Herniated cervical disc with stenosis, cord compression, myelopathy, radiculopathy, cervicalgia C 45, C 56, C 67 levels, left carpal tunnel syndrome  Discharge Diagnoses: Herniated cervical disc with stenosis, cord compression, myelopathy, radiculopathy, cervicalgia C 45, C 56, C 67 levels, left carpal tunnel syndrome  Active Problems:   Cervical stenosis of spinal canal   Discharged Condition: good  Hospital Course: Patient underwent anterior cervical decompression and fusion C 45, C 56, C C 67 levels with left carpal tunnel release.  He did well with surgery with significant improvement in his preoperative symptoms.  He was discharged home on the morning of POD 1.  Consults: None  Significant Diagnostic Studies: None  Treatments: surgery:  Patient underwent anterior cervical decompression and fusion C 45, C 56, C C 67 levels with left carpal tunnel release  Discharge Exam: Blood pressure (!) 154/89, pulse 79, temperature 97.9 F (36.6 C), temperature source Oral, resp. rate 20, height 5\' 11"  (1.803 m), weight 124.5 kg, SpO2 95 %. Neurologic: Alert and oriented X 3, normal strength and tone. Normal symmetric reflexes. Normal coordination and gait Wound:CDI  Disposition: Home      Discharge Instructions    Diet - low sodium heart healthy   Complete by: As directed    Increase activity slowly   Complete by: As directed    No wound care   Complete by: As directed           Allergies as of 01/17/2020      Reactions   Codeine Itching       Discharge Instructions    Diet - low sodium heart healthy   Complete by: As directed    Increase activity slowly   Complete by: As directed    No wound care   Complete by: As directed      Allergies as of 01/17/2020      Reactions    Codeine Itching      Medication List    TAKE these medications   ALPRAZolam 1 MG tablet Commonly known as: XANAX Take 1 tablet (1 mg total) by mouth 2 (two) times daily as needed.   amLODipine 5 MG tablet Commonly known as: NORVASC Take 1 tablet (5 mg total) by mouth daily.   aspirin EC 81 MG tablet Take 81 mg by mouth every evening.   atorvastatin 80 MG tablet Commonly known as: LIPITOR Take 1 tablet (80 mg total) by mouth daily.   carisoprodol 350 MG tablet Commonly known as: SOMA TAKE 1 TABLET 3 TIMES A DAY AS NEEDED AS DIRECTED What changed: See the new instructions.   Farxiga 10 MG Tabs tablet Generic drug: dapagliflozin propanediol Take 10 mg by mouth daily before breakfast.   gabapentin 300 MG capsule Commonly known as: NEURONTIN Take 1 capsule (300 mg total) by mouth 4 (four) times daily.   glipiZIDE 10 MG 24 hr tablet Commonly known as: GLUCOTROL XL Take 1 tablet (10 mg total) by mouth daily with breakfast.   HYDROcodone-acetaminophen 5-325 MG tablet Commonly known as: NORCO/VICODIN Take 1-2 tablets by mouth every 4 (four) hours as needed for severe pain ((score 7 to 10)).   metFORMIN 500 MG tablet Commonly known as: GLUCOPHAGE TAKE 2 TABLETS BY MOUTH EVERY MORNING AND TAKE 2 TABLETS BY MOUTH EVERY EVENING What changed:   how much to take  how to take this  when to take this  additional instructions   pioglitazone 45 MG tablet Commonly known as: ACTOS Take 1 tablet (45 mg total) by mouth daily.   venlafaxine XR 37.5 MG 24 hr capsule Commonly known as: EFFEXOR-XR TAKE 1 CAPSULE BY MOUTH EVERY DAY WITH BREAKFAST What changed:   how much to take  how to take this  when to take this  additional instructions   Vitamin D (Ergocalciferol) 1.25 MG (50000 UNIT) Caps capsule Commonly known as: DRISDOL Take 1 capsule (50,000 Units total) by mouth every 7 (seven) days.   Vitamin D 50 MCG (2000 UT) Caps Take 2,000 Units by mouth daily.         Signed: Peggyann Shoals, MD 01/17/2020, 7:34 AM

## 2020-01-17 NOTE — Anesthesia Postprocedure Evaluation (Signed)
Anesthesia Post Note  Patient: BRACK SHADDOCK  Procedure(s) Performed: Cervical 4-5 Cervical 5-6 Cervical 6-7 Anterior cervical decompression/discectomy/fusion (N/A ) LEFT CARPAL TUNNEL RELEASE (Left )     Patient location during evaluation: PACU Anesthesia Type: General Level of consciousness: awake and alert Pain management: pain level controlled Vital Signs Assessment: post-procedure vital signs reviewed and stable Respiratory status: spontaneous breathing, nonlabored ventilation and respiratory function stable Cardiovascular status: blood pressure returned to baseline and stable Postop Assessment: no apparent nausea or vomiting Anesthetic complications: no    Last Vitals:  Vitals:   01/17/20 0258 01/17/20 0757  BP: (!) 154/89 (!) 142/74  Pulse: 79 81  Resp: 20 17  Temp: 36.6 C 36.9 C  SpO2: 95% 97%    Last Pain:  Vitals:   01/17/20 0757  TempSrc: Oral  PainSc:                  Lidia Collum

## 2020-01-17 NOTE — Evaluation (Signed)
Occupational Therapy Evaluation Patient Details Name: Jeffrey [name] MRN: [id number] DOB: 11-21-1965 Today's Date: 01/17/2020    History of Present Illness 54 yo male s/p ACDF C4-5 5-6 6-7 with L carpal tunnel release PMH R TSA arthritis DM2 HTn high cholesterol skin CA   Clinical Impression   Patient evaluated by Occupational Therapy with no further acute OT needs identified. All education has been completed and the patient has no further questions. See below for any follow-up Occupational Therapy or equipment needs. OT to sign off. Thank you for referral.       Follow Up Recommendations  No OT follow up(outpatient hand following MD appointment possibly)    Equipment Recommendations  None recommended by OT    Recommendations for Other Services       Precautions / Restrictions Precautions Precautions: Cervical Precaution Comments: handout provided and reviewed for cervical precautions with adls  Required Braces or Orthoses: Cervical Brace Cervical Brace: Hard collar;At all times      Mobility Bed Mobility Overal bed mobility: Modified Independent                Transfers Overall transfer level: Needs assistance   Transfers: Sit to/from Stand Sit to Stand: Supervision              Balance                                           ADL either performed or assessed with clinical judgement   ADL Overall ADL's : Needs assistance/impaired Eating/Feeding: Modified independent   Grooming: Wash/dry face   Upper Body Bathing: Modified independent   Lower Body Bathing: Minimal assistance   Upper Body Dressing : Modified independent   Lower Body Dressing: Minimal assistance   Toilet Transfer: Supervision/safety             General ADL Comments: educated on toilet aid use. don doff of brace and return demo min (A)> pt educated on sleeping positioning Cervical precautions ( handout provided): Educated patient on don doff brace with  return demonstration, educated on oral care using cups, washing face with cloth, never to wash directly on incision site, avoid neck rotation flexion and extension, positioning with pillows in chair for bil UE, sleeping positioning, avoiding pushing / pulling with bil UE, Pt educated on need to notify doctor / RN of swallowing changes or choking..       Vision Baseline Vision/History: Wears glasses Wears Glasses: Reading only       Perception     Praxis      Pertinent Vitals/Pain Pain Assessment: No/denies pain     Hand Dominance Right   Extremity/Trunk Assessment Upper Extremity Assessment Upper Extremity Assessment: LUE deficits/detail LUE Deficits / Details: able to complete digit flexion extension, able to verbalize NWB, educated on ice and elevation with digit activation for edema management   Lower Extremity Assessment Lower Extremity Assessment: Defer to PT evaluation   Cervical / Trunk Assessment Cervical / Trunk Assessment: Other exceptions Cervical / Trunk Exceptions: s/p surg   Communication Communication Communication: No difficulties   Cognition Arousal/Alertness: Awake/alert Behavior During Therapy: WFL for tasks assessed/performed Overall Cognitive Status: Within Functional Limits for tasks assessed  General Comments  all dressings intact and dry    Exercises     Shoulder Instructions      Home Living Family/patient expects to be discharged to:: Private residence   Available Help at Discharge: Family Type of Home: House Home Access: Stairs to enter Technical brewer of Steps: 8   Home Layout: One level     Bathroom Shower/Tub: Occupational psychologist: Handicapped height     Home Equipment: [place] held shower head;Adaptive equipment Adaptive Equipment: Reacher;Sock aid;Other (Comment)(toilet aid) Additional Comments: has 2 cats and dog in the home. Dog likes to sit  with patient.      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 [employer]        OT Problem List:        OT Treatment/Interventions:      OT Goals(Current goals can be found in the care plan section) Acute Rehab OT Goals Patient Stated Goal: to go home today and return to work  OT Frequency:     Barriers to D/C:            Co-evaluation              AM-PAC OT "6 Clicks" Daily Activity     Outcome Measure Help from another person eating meals?: None Help from another person taking care of personal grooming?: None Help from another person toileting, which includes using toliet, bedpan, or urinal?: None Help from another person bathing (including washing, rinsing, drying)?: None Help from another person to put on and taking off regular upper body clothing?: None Help from another person to put on and taking off regular lower body clothing?: A Little 6 Click Score: 23   End of Session Equipment Utilized During Treatment: Cervical collar Nurse Communication: Mobility status;Precautions;Weight bearing status  Activity Tolerance: Patient tolerated treatment well Patient left: in bed;with call bell/phone within reach;with family/visitor present  OT Visit Diagnosis: Unsteadiness on feet (R26.81)                Time: 9643-8381 OT Time Calculation (min): 27 min Charges:  OT General Charges $OT Visit: 1 Visit OT Evaluation $OT Eval Moderate Complexity: 1 Mod   Brynn, OTR/L  Acute Rehabilitation Services Pager: [phone] Office: [phone] .   Jeri Modena 01/17/2020, 10:19 AM

## 2020-01-17 NOTE — Progress Notes (Signed)
Subjective: Patient reports doing well  Objective: Vital signs in last 24 hours: Temp:  [97.9 F (36.6 C)-98.4 F (36.9 C)] 97.9 F (36.6 C) (06/04 0258) Pulse Rate:  [79-98] 79 (06/04 0258) Resp:  [11-20] 20 (06/04 0258) BP: (154-177)/(76-101) 154/89 (06/04 0258) SpO2:  [90 %-99 %] 95 % (06/04 0258) Weight:  [124.5 kg] 124.5 kg (06/03 0934)  Intake/Output from previous day: 06/03 0701 - 06/04 0700 In: 1250 [I.V.:1200; IV Piggyback:50] Out: 195 [Drains:60; Blood:135] Intake/Output this shift: No intake/output data recorded.  Physical Exam: Strength full.  Dressings CDI.  Lab Results: No results for input(s): WBC, HGB, HCT, PLT in the last 72 hours. BMET No results for input(s): NA, K, CL, CO2, GLUCOSE, BUN, CREATININE, CALCIUM in the last 72 hours.  Studies/Results: DG Cervical Spine 2-3 Views  Result Date: 01/16/2020 CLINICAL DATA:  C4 through C7 ACDF EXAM: CERVICAL SPINE - 2-3 VIEW COMPARISON:  08/26/2019 FINDINGS: Two lateral views of the cervical spine are obtained. Evaluation is limited due to portable cross-table positioning and technique. Initial image demonstrates instrumentation at the C3/C4 level. Final image demonstrates the upper extent of an anterior cervical fusion, with fusion plate and intra corporal screws visualized at C4 and C5. The inferior extent is not well visualized due to overlapping structures. Patient is intubated with enteric catheter in place. IMPRESSION: 1. Intraoperative exam as above. Electronically Signed   By: Randa Ngo M.D.   On: 01/16/2020 16:24    Assessment/Plan: Patient is doing well.  Discharge home.    LOS: 0 days    Peggyann Shoals, MD 01/17/2020, 7:29 AM

## 2020-01-17 NOTE — Discharge Instructions (Signed)

## 2020-01-17 NOTE — Evaluation (Signed)
Physical Therapy Evaluation and Discharge Patient Details Name: Jeffrey [name] MRN: [id number] DOB: 01/27/1966 Today's Date: 01/17/2020   History of Present Illness  54 yo male s/p ACDF C4-5 5-6 6-7 with L carpal tunnel release PMH R TSA arthritis DM2 HTn high cholesterol skin CA  Clinical Impression  Patient evaluated by Physical Therapy with no further acute PT needs identified. All education has been completed and the patient has no further questions. Pt was able to demonstrate transfers and ambulation with gross modified independence. Pt was educated on precautions, brace application/wearing schedule, appropriate activity progression, and car transfer. See below for any follow-up Physical Therapy or equipment needs. PT is signing off. Thank you for this referral.     Follow Up Recommendations No PT follow up;Supervision - Intermittent    Equipment Recommendations  None recommended by PT    Recommendations for Other Services       Precautions / Restrictions Precautions Precautions: Cervical Precaution Comments: handout provided and reviewed for cervical precautions with functional mobility Required Braces or Orthoses: Cervical Brace Cervical Brace: Hard collar;At all times Restrictions Weight Bearing Restrictions: No      Mobility  Bed Mobility Overal bed mobility: Modified Independent                Transfers Overall transfer level: Modified independent Equipment used: None Transfers: Sit to/from Stand Sit to Stand: Supervision         General transfer comment: No assist to power-up to full standing position.   Ambulation/Gait Ambulation/Gait assistance: Modified independent (Device/Increase time) Gait Distance (Feet): 400 Feet Assistive device: None Gait Pattern/deviations: Step-through pattern;Decreased stride length Gait velocity: Decreased Gait velocity interpretation: 1.31 - 2.62 ft/sec, indicative of limited community ambulator General Gait  Details: VC's for improved posture. No assistance required and no unsteadiness/LOB noted.   Stairs Stairs: Yes Stairs assistance: Supervision;Modified independent (Device/Increase time) Stair Management: One rail Right;Alternating pattern;Forwards Number of Stairs: 10 General stair comments: VC's for sequencing and general safety. Pt was able to complete a full flight without difficulty.   Wheelchair Mobility    Modified Rankin (Stroke Patients Only)       Balance Overall balance assessment: Mild deficits observed, not formally tested                                           Pertinent Vitals/Pain Pain Assessment: No/denies pain    Home Living Family/patient expects to be discharged to:: Private residence Living Arrangements: Spouse/significant other Available Help at Discharge: Family Type of Home: House Home Access: Stairs to enter   Technical brewer of Steps: 8 Home Layout: One level Home Equipment: Shower seat;Hand held shower head;Adaptive equipment Additional Comments: has 2 cats and dog in the home. Dog likes to sit with patient.    Prior Function Level of Independence: Independent         Comments: works in Health and safety inspector to [employer]   Dominant Hand: Right    Extremity/Trunk Assessment   Upper Extremity Assessment Upper Extremity Assessment: Defer to OT evaluation LUE Deficits / Details: able to complete digit flexion extension, able to verbalize NWB, educated on ice and elevation with digit activation for edema management    Lower Extremity Assessment Lower Extremity Assessment: Overall WFL for tasks assessed    Cervical / Trunk Assessment Cervical / Trunk Assessment: Other exceptions  Cervical / Trunk Exceptions: s/p surg  Communication   Communication: No difficulties  Cognition Arousal/Alertness: Awake/alert Behavior During Therapy: WFL for tasks  assessed/performed Overall Cognitive Status: Within Functional Limits for tasks assessed                                        General Comments General comments (skin integrity, edema, etc.): all dressings intact and dry    Exercises     Assessment/Plan    PT Assessment Patent does not need any further PT services  PT Problem List         PT Treatment Interventions      PT Goals (Current goals can be found in the Care Plan section)  Acute Rehab PT Goals Patient Stated Goal: to go home today and return to work PT Goal Formulation: All assessment and education complete, DC therapy    Frequency     Barriers to discharge        Co-evaluation               AM-PAC PT "6 Clicks" Mobility  Outcome Measure Help needed turning from your back to your side while in a flat bed without using bedrails?: None Help needed moving from lying on your back to sitting on the side of a flat bed without using bedrails?: None Help needed moving to and from a bed to a chair (including a wheelchair)?: None Help needed standing up from a chair using your arms (e.g., wheelchair or bedside chair)?: None Help needed to walk in hospital room?: None Help needed climbing 3-5 steps with a railing? : None 6 Click Score: 24    End of Session Equipment Utilized During Treatment: Cervical collar Activity Tolerance: Patient tolerated treatment well Patient left: in bed;with call bell/phone within reach;with family/visitor present Nurse Communication: Mobility status PT Visit Diagnosis: Unsteadiness on feet (R26.81);Pain Pain - Right/Left: Left Pain - part of body: Hand    Time: 3817-7116 PT Time Calculation (min) (ACUTE ONLY): 21 min   Charges:   PT Evaluation $PT Eval Low Complexity: 1 Low          Jeffrey [name], PT, DPT Acute Rehabilitation Services Pager: [phone] Office: [phone]   Thelma Comp 01/17/2020, 11:07 AM

## 2020-01-27 DIAGNOSIS — Z6836 Body mass index (BMI) 36.0-36.9, adult: Secondary | ICD-10-CM | POA: Insufficient documentation

## 2020-02-12 ENCOUNTER — Other Ambulatory Visit: Payer: Self-pay | Admitting: Internal Medicine

## 2020-02-12 NOTE — Telephone Encounter (Signed)
Please refill as per office routine med refill policy (all routine meds refilled for 3 mo or monthly per pt preference up to one year from last visit, then month to month grace period for 3 mo, then further med refills will have to be denied)  

## 2020-02-20 ENCOUNTER — Other Ambulatory Visit (HOSPITAL_COMMUNITY): Payer: 59

## 2020-02-20 ENCOUNTER — Encounter (HOSPITAL_COMMUNITY)
Admission: RE | Admit: 2020-02-20 | Discharge: 2020-02-20 | Disposition: A | Discharge status: Home / Self Care | Payer: 59 | Source: Ambulatory Visit | Attending: Neurosurgery | Admitting: Neurosurgery

## 2020-02-20 ENCOUNTER — Other Ambulatory Visit: Payer: Self-pay

## 2020-02-20 ENCOUNTER — Encounter (HOSPITAL_COMMUNITY): Payer: Self-pay

## 2020-02-20 DIAGNOSIS — Z01812 Encounter for preprocedural laboratory examination: Secondary | ICD-10-CM | POA: Diagnosis not present

## 2020-02-20 LAB — BASIC METABOLIC PANEL
Anion gap: 9 (ref 5–15)
BUN: <5 mg/dL — ABNORMAL LOW (ref 6–20)
CO2: 25 mmol/L (ref 22–32)
Calcium: 9.8 mg/dL (ref 8.9–10.3)
Chloride: 106 mmol/L (ref 98–111)
Creatinine, Ser: 0.74 mg/dL (ref 0.61–1.24)
GFR calc Af Amer: >60 mL/min (ref >60)
GFR calc non Af Amer: >60 mL/min (ref >60)
Glucose, Bld: 96 mg/dL (ref 70–99)
Potassium: 4 mmol/L (ref 3.5–5.1)
Sodium: 140 mmol/L (ref 135–145)

## 2020-02-20 LAB — CBC
HCT: 53.2 % — ABNORMAL HIGH (ref 39.0–52.0)
Hemoglobin: 17.6 g/dL — ABNORMAL HIGH (ref 13.0–17.0)
MCH: 30.4 pg (ref 26.0–34.0)
MCHC: 33.1 g/dL (ref 30.0–36.0)
MCV: 91.9 fL (ref 80.0–100.0)
Platelets: 218 10*3/uL (ref 150–400)
RBC: 5.79 MIL/uL (ref 4.22–5.81)
RDW: 13.1 % (ref 11.5–15.5)
WBC: 6.5 10*3/uL (ref 4.0–10.5)
nRBC: 0 % (ref 0.0–0.2)

## 2020-02-20 LAB — GLUCOSE, CAPILLARY: Glucose-Capillary: 99 mg/dL (ref 70–99)

## 2020-02-20 NOTE — Pre-Procedure Instructions (Addendum)
Your procedure is scheduled on Tuesday, July 13th.  Report to [facility] Main Entrance "A" at 10:00 A.M., and check in at the Admitting office.  Call this number if you have problems the morning of surgery:  [phone]  Call [phone] if you have any questions prior to your surgery date Monday-Friday 8am-4pm    Remember:  Do not eat or drink after midnight the night before your surgery    Take these medicines the morning of surgery with A SIP OF WATER  amLODipine (NORVASC)  atorvastatin (LIPITOR)  gabapentin (NEURONTIN)  fluticasone (FLONASE) -as needed carisoprodol (SOMA)-as needed.  Follow your surgeon's instructions on when to stop Aspirin.  If no instructions were given by your surgeon then you will need to call the office to get those instructions.    As of today, STOP taking any Aspirin (unless otherwise instructed by your surgeon) Aleve, Naproxen, Ibuprofen, Motrin, Advil, Goody's, BC's, all herbal medications, fish oil, and all vitamins.   WHAT DO I DO ABOUT MY DIABETES MEDICATION?   Marland Kitchen Do not take oral dapagliflozin propanediol (FARXIGA) the day before your surgery or the day of your surgery.   . Do not take glipiZIDE (GLUCOTROL XL) or metFORMIN (GLUCOPHAGE) the day of your surgery.    . Take pioglitazone (ACTOS) as usual.      HOW TO MANAGE YOUR DIABETES BEFORE AND AFTER SURGERY  Why is it important to control my blood sugar before and after surgery? . Improving blood sugar levels before and after surgery helps healing and can limit problems. . A way of improving blood sugar control is eating a healthy diet by: o  Eating less sugar and carbohydrates o  Increasing activity/exercise o  Talking with your doctor about reaching your blood sugar goals . High blood sugars (greater than 180 mg/dL) can raise your risk of infections and slow your recovery, so you will need to focus on controlling your diabetes during the weeks before surgery. . Make sure that the  doctor who takes care of your diabetes knows about your planned surgery including the date and location.  How do I manage my blood sugar before surgery? . Check your blood sugar at least 4 times a day, starting 2 days before surgery, to make sure that the level is not too high or low. . Check your blood sugar the morning of your surgery when you wake up and every 2 hours until you get to the Short Stay unit. o If your blood sugar is less than 70 mg/dL, you will need to treat for low blood sugar: - Do not take insulin. - Treat a low blood sugar (less than 70 mg/dL) with  cup of clear juice (cranberry or apple), 4 glucose tablets, OR glucose gel. - Recheck blood sugar in 15 minutes after treatment (to make sure it is greater than 70 mg/dL). If your blood sugar is not greater than 70 mg/dL on recheck, call [phone] for further instructions. . Report your blood sugar to the short stay nurse when you get to Short Stay.  . If you are admitted to the hospital after surgery: o Your blood sugar will be checked by the staff and you will probably be given insulin after surgery (instead of oral diabetes medicines) to make sure you have good blood sugar levels. o The goal for blood sugar control after surgery is 80-180 mg/dL.              Do not wear jewelry.  Do not wear lotions, powders, colognes, or deodorant.            Men may shave face and neck.            Do not bring valuables to the hospital.            [facility] is not responsible for any belongings or valuables.  Do NOT Smoke (Tobacco/Vaping) or drink Alcohol 24 hours prior to your procedure If you use a CPAP at night, you may bring all equipment for your overnight stay.   Contacts, glasses, dentures or bridgework may not be worn into surgery.      For patients admitted to the hospital, discharge time will be determined by your treatment team.   Patients discharged the day of surgery will not be allowed to drive home, and  someone needs to stay with them for 24 hours.    Special instructions:   [place]- Preparing For Surgery  Before surgery, you can play an important role. Because skin is not sterile, your skin needs to be as free of germs as possible. You can reduce the number of germs on your skin by washing with CHG (chlorahexidine gluconate) Soap before surgery.  CHG is an antiseptic cleaner which kills germs and bonds with the skin to continue killing germs even after washing.    Oral Hygiene is also important to reduce your risk of infection.  Remember - BRUSH YOUR TEETH THE MORNING OF SURGERY WITH YOUR REGULAR TOOTHPASTE  Please do not use if you have an allergy to CHG or antibacterial soaps. If your skin becomes reddened/irritated stop using the CHG.  Do not shave (including legs and underarms) for at least 48 hours prior to first CHG shower. It is OK to shave your face.  Please follow these instructions carefully.   1. Shower the NIGHT BEFORE SURGERY and the MORNING OF SURGERY with CHG Soap.   2. If you chose to wash your hair, wash your hair first as usual with your normal shampoo.  3. After you shampoo, rinse your hair and body thoroughly to remove the shampoo.  4. Use CHG as you would any other liquid soap. You can apply CHG directly to the skin and wash gently with a scrungie or a clean washcloth.   5. Apply the CHG Soap to your body ONLY FROM THE NECK DOWN.  Do not use on open wounds or open sores. Avoid contact with your eyes, ears, mouth and genitals (private parts). Wash Face and genitals (private parts)  with your normal soap.   6. Wash thoroughly, paying special attention to the area where your surgery will be performed.  7. Thoroughly rinse your body with warm water from the neck down.  8. DO NOT shower/wash with your normal soap after using and rinsing off the CHG Soap.  9. Pat yourself dry with a CLEAN TOWEL.  10. Wear CLEAN PAJAMAS to bed the night before  surgery  11. Place CLEAN SHEETS on your bed the night of your first shower and DO NOT SLEEP WITH PETS.   Day of Surgery: Wear Clean/Comfortable clothing the morning of surgery Do not apply any deodorants/lotions.   Remember to brush your teeth WITH YOUR REGULAR TOOTHPASTE.   Please read over the following fact sheets that you were given.

## 2020-02-20 NOTE — Progress Notes (Signed)
PCP - Dr. Cathlean Cower Cardiologist - denies  PPM/ICD - N/A Device Orders -N/A  Rep Notified - N/A  Chest x-ray - N/A EKG - 01/09/20 Stress Test - denies ECHO - denies Cardiac Cath - denies  Sleep Study - has had a sleep study in the past. Pt states it was negative for OSA. CPAP - denies  Fasting Blood Sugar -90-150  Checks Blood Sugar 2-3 times a week  Blood Thinner Instructions:N/A Aspirin Instructions:N/A  ERAS Protcol -N/A PRE-SURGERY Ensure or G2-N/A   COVID TEST- Scheduled for 02/21/20.    Anesthesia review: No  Patient denies shortness of breath, fever, cough and chest pain at PAT appointment   All instructions explained to the patient, with a verbal understanding of the material. Patient agrees to go over the instructions while at home for a better understanding. Patient also instructed to self quarantine after being tested for COVID-19. The opportunity to ask questions was provided.    Coronavirus Screening  Have you experienced the following symptoms:  Cough yes/no: No Fever (>100.42F)  yes/no: No Runny nose yes/no: No Sore throat yes/no: No Difficulty breathing/shortness of breath  yes/no: No  Have you or a family member traveled in the last 14 days and where? yes/no: No   If the patient indicates "YES" to the above questions, their PAT will be rescheduled to limit the exposure to others and, the surgeon will be notified. THE PATIENT WILL NEED TO BE ASYMPTOMATIC FOR 14 DAYS.   If the patient is not experiencing any of these symptoms, the PAT nurse will instruct them to NOT bring anyone with them to their appointment since they may have these symptoms or traveled as well.   Please remind your patients and families that hospital visitation restrictions are in effect and the importance of the restrictions.

## 2020-02-21 ENCOUNTER — Other Ambulatory Visit (HOSPITAL_COMMUNITY)
Admission: RE | Admit: 2020-02-21 | Discharge: 2020-02-21 | Disposition: A | Discharge status: Home / Self Care | Payer: 59 | Source: Ambulatory Visit | Attending: Neurosurgery | Admitting: Neurosurgery

## 2020-02-21 DIAGNOSIS — Z20822 Contact with and (suspected) exposure to covid-19: Secondary | ICD-10-CM | POA: Diagnosis not present

## 2020-02-21 DIAGNOSIS — Z01812 Encounter for preprocedural laboratory examination: Secondary | ICD-10-CM | POA: Diagnosis not present

## 2020-02-21 LAB — SARS CORONAVIRUS 2 (TAT 6-24 HRS): SARS Coronavirus 2: NEGATIVE

## 2020-02-25 ENCOUNTER — Encounter (HOSPITAL_COMMUNITY)
Admission: RE | Disposition: A | Discharge status: Home / Self Care | Payer: Self-pay | Source: Home / Self Care | Attending: Neurosurgery

## 2020-02-25 ENCOUNTER — Ambulatory Visit (HOSPITAL_COMMUNITY): Payer: 59

## 2020-02-25 ENCOUNTER — Encounter (HOSPITAL_COMMUNITY): Payer: Self-pay | Admitting: Neurosurgery

## 2020-02-25 ENCOUNTER — Ambulatory Visit (HOSPITAL_COMMUNITY)
Admission: RE | Admit: 2020-02-25 | Discharge: 2020-02-25 | Disposition: A | Discharge status: Home / Self Care | Payer: 59 | Attending: Neurosurgery | Admitting: Neurosurgery

## 2020-02-25 ENCOUNTER — Other Ambulatory Visit: Payer: Self-pay

## 2020-02-25 DIAGNOSIS — Z6837 Body mass index (BMI) 37.0-37.9, adult: Secondary | ICD-10-CM | POA: Diagnosis not present

## 2020-02-25 DIAGNOSIS — F418 Other specified anxiety disorders: Secondary | ICD-10-CM | POA: Diagnosis not present

## 2020-02-25 DIAGNOSIS — Z79899 Other long term (current) drug therapy: Secondary | ICD-10-CM | POA: Insufficient documentation

## 2020-02-25 DIAGNOSIS — G5603 Carpal tunnel syndrome, bilateral upper limbs: Secondary | ICD-10-CM | POA: Diagnosis present

## 2020-02-25 DIAGNOSIS — E119 Type 2 diabetes mellitus without complications: Secondary | ICD-10-CM | POA: Insufficient documentation

## 2020-02-25 DIAGNOSIS — M1990 Unspecified osteoarthritis, unspecified site: Secondary | ICD-10-CM | POA: Diagnosis not present

## 2020-02-25 DIAGNOSIS — Z7984 Long term (current) use of oral hypoglycemic drugs: Secondary | ICD-10-CM | POA: Diagnosis not present

## 2020-02-25 DIAGNOSIS — I10 Essential (primary) hypertension: Secondary | ICD-10-CM | POA: Diagnosis not present

## 2020-02-25 DIAGNOSIS — F17200 Nicotine dependence, unspecified, uncomplicated: Secondary | ICD-10-CM | POA: Diagnosis not present

## 2020-02-25 HISTORY — PX: CARPAL TUNNEL RELEASE: SHX101

## 2020-02-25 LAB — GLUCOSE, CAPILLARY
Glucose-Capillary: 183 mg/dL — ABNORMAL HIGH (ref 70–99)
Glucose-Capillary: 161 mg/dL — ABNORMAL HIGH (ref 70–99)

## 2020-02-25 SURGERY — CARPAL TUNNEL RELEASE
Anesthesia: Monitor Anesthesia Care | Site: Wrist | Laterality: Right

## 2020-02-25 MED ORDER — AMLODIPINE BESYLATE 5 MG PO TABS: 5.0000 mg | ORAL_TABLET | Freq: Every day | ORAL | Status: DC

## 2020-02-25 MED ORDER — FENTANYL CITRATE (PF) 100 MCG/2ML IJ SOLN
INTRAMUSCULAR | Status: DC | PRN
  Administered 2020-02-25: 100 ug via INTRAVENOUS

## 2020-02-25 MED ORDER — CHLORHEXIDINE GLUCONATE 0.12 % MT SOLN
OROMUCOSAL | Status: AC
  Administered 2020-02-25: 15 mL via OROMUCOSAL
  Filled 2020-02-25: qty 15

## 2020-02-25 MED ORDER — PROPOFOL 10 MG/ML IV BOLUS
INTRAVENOUS | Status: AC
  Filled 2020-02-25: qty 20

## 2020-02-25 MED ORDER — LIDOCAINE HCL (PF) 1 % IJ SOLN
INTRAMUSCULAR | Status: DC | PRN
  Administered 2020-02-25: 2 mL

## 2020-02-25 MED ORDER — OXYCODONE HCL 5 MG PO TABS: 5.0000 mg | ORAL_TABLET | Freq: Once | ORAL | Status: DC | PRN

## 2020-02-25 MED ORDER — METFORMIN HCL 500 MG PO TABS: 1000.0000 mg | ORAL_TABLET | Freq: Two times a day (BID) | ORAL | Status: DC

## 2020-02-25 MED ORDER — ASPIRIN EC 81 MG PO TBEC: 81.0000 mg | DELAYED_RELEASE_TABLET | Freq: Every evening | ORAL | Status: DC

## 2020-02-25 MED ORDER — FLUTICASONE PROPIONATE 50 MCG/ACT NA SUSP: 1.0000 | Freq: Every day | NASAL | Status: DC | PRN

## 2020-02-25 MED ORDER — CHLORHEXIDINE GLUCONATE 0.12 % MT SOLN: 15.0000 mL | Freq: Once | OROMUCOSAL | Status: AC

## 2020-02-25 MED ORDER — MIDAZOLAM HCL 2 MG/2ML IJ SOLN
INTRAMUSCULAR | Status: AC
  Administered 2020-02-25: 2 mg via INTRAVENOUS
  Filled 2020-02-25: qty 2

## 2020-02-25 MED ORDER — ALBUTEROL SULFATE HFA 108 (90 BASE) MCG/ACT IN AERS
INHALATION_SPRAY | RESPIRATORY_TRACT | Status: DC | PRN
Start: 2020-02-25 — End: 2020-02-25
  Administered 2020-02-25: 6 via RESPIRATORY_TRACT

## 2020-02-25 MED ORDER — ORAL CARE MOUTH RINSE: 15.0000 mL | Freq: Once | OROMUCOSAL | Status: AC

## 2020-02-25 MED ORDER — FENTANYL CITRATE (PF) 100 MCG/2ML IJ SOLN
INTRAMUSCULAR | Status: AC
  Administered 2020-02-25: 50 ug via INTRAVENOUS
  Filled 2020-02-25: qty 2

## 2020-02-25 MED ORDER — PIOGLITAZONE HCL 45 MG PO TABS: 45.0000 mg | ORAL_TABLET | Freq: Every evening | ORAL | Status: DC

## 2020-02-25 MED ORDER — PROMETHAZINE HCL 25 MG/ML IJ SOLN: 6.2500 mg | INTRAMUSCULAR | Status: DC | PRN

## 2020-02-25 MED ORDER — FENTANYL CITRATE (PF) 250 MCG/5ML IJ SOLN
INTRAMUSCULAR | Status: AC
  Filled 2020-02-25: qty 5

## 2020-02-25 MED ORDER — CARISOPRODOL 350 MG PO TABS: 350.0000 mg | ORAL_TABLET | Freq: Three times a day (TID) | ORAL | Status: DC | PRN

## 2020-02-25 MED ORDER — PROPOFOL 500 MG/50ML IV EMUL
INTRAVENOUS | Status: DC | PRN
  Administered 2020-02-25: 100 ug/kg/min via INTRAVENOUS

## 2020-02-25 MED ORDER — ACETAMINOPHEN 500 MG PO TABS: 1000.0000 mg | ORAL_TABLET | Freq: Once | ORAL | Status: AC

## 2020-02-25 MED ORDER — ONDANSETRON HCL 4 MG/2ML IJ SOLN
INTRAMUSCULAR | Status: DC | PRN
  Administered 2020-02-25: 4 mg via INTRAVENOUS

## 2020-02-25 MED ORDER — PROPOFOL 10 MG/ML IV BOLUS
INTRAVENOUS | Status: DC | PRN
  Administered 2020-02-25: 70 mg via INTRAVENOUS
  Administered 2020-02-25: 100 mg via INTRAVENOUS
  Administered 2020-02-25: 60 mg via INTRAVENOUS

## 2020-02-25 MED ORDER — FENTANYL CITRATE (PF) 100 MCG/2ML IJ SOLN: 50.0000 ug | Freq: Once | INTRAMUSCULAR | Status: AC

## 2020-02-25 MED ORDER — LACTATED RINGERS IV SOLN: INTRAVENOUS | Status: DC

## 2020-02-25 MED ORDER — ATORVASTATIN CALCIUM 80 MG PO TABS: 80.0000 mg | ORAL_TABLET | Freq: Every day | ORAL | Status: DC

## 2020-02-25 MED ORDER — SUCCINYLCHOLINE CHLORIDE 20 MG/ML IJ SOLN
INTRAMUSCULAR | Status: DC | PRN
Start: 2020-02-25 — End: 2020-02-25
  Administered 2020-02-25: 120 mg via INTRAVENOUS

## 2020-02-25 MED ORDER — HYDROMORPHONE HCL 1 MG/ML IJ SOLN: 0.2500 mg | INTRAMUSCULAR | Status: DC | PRN

## 2020-02-25 MED ORDER — DAPAGLIFLOZIN PROPANEDIOL 10 MG PO TABS: 10.0000 mg | ORAL_TABLET | Freq: Every day | ORAL | Status: DC

## 2020-02-25 MED ORDER — OXYCODONE HCL 5 MG/5ML PO SOLN: 5.0000 mg | Freq: Once | ORAL | Status: DC | PRN

## 2020-02-25 MED ORDER — GABAPENTIN 300 MG PO CAPS: 300.0000 mg | ORAL_CAPSULE | Freq: Four times a day (QID) | ORAL | Status: DC

## 2020-02-25 MED ORDER — GLIPIZIDE ER 10 MG PO TB24: 10.0000 mg | ORAL_TABLET | Freq: Every day | ORAL | Status: DC

## 2020-02-25 MED ORDER — MIDAZOLAM HCL 2 MG/2ML IJ SOLN
INTRAMUSCULAR | Status: AC
  Filled 2020-02-25: qty 2

## 2020-02-25 MED ORDER — VITAMIN D 50 MCG (2000 UT) PO CAPS: 2000.0000 [IU] | ORAL_CAPSULE | Freq: Every day | ORAL | Status: DC

## 2020-02-25 MED ORDER — LIDOCAINE HCL (PF) 1 % IJ SOLN
INTRAMUSCULAR | Status: AC
  Filled 2020-02-25: qty 30

## 2020-02-25 MED ORDER — ACETAMINOPHEN 500 MG PO TABS
ORAL_TABLET | ORAL | Status: AC
  Administered 2020-02-25: 1000 mg via ORAL
  Filled 2020-02-25: qty 2

## 2020-02-25 MED ORDER — DEXTROSE 5 % IV SOLN
3.0000 g | INTRAVENOUS | Status: AC
  Administered 2020-02-25: 3 g via INTRAVENOUS
  Filled 2020-02-25: qty 3

## 2020-02-25 MED ORDER — MIDAZOLAM HCL 2 MG/2ML IJ SOLN: 2.0000 mg | Freq: Once | INTRAMUSCULAR | Status: AC

## 2020-02-25 MED ORDER — 0.9 % SODIUM CHLORIDE (POUR BTL) OPTIME
TOPICAL | Status: DC | PRN
  Administered 2020-02-25: 1000 mL

## 2020-02-25 MED ORDER — DEXAMETHASONE SODIUM PHOSPHATE 10 MG/ML IJ SOLN
INTRAMUSCULAR | Status: DC | PRN
  Administered 2020-02-25: 4 mg via INTRAVENOUS

## 2020-02-25 MED ORDER — ALPRAZOLAM 1 MG PO TABS: 1.0000 mg | ORAL_TABLET | Freq: Two times a day (BID) | ORAL | Status: DC | PRN

## 2020-02-25 SURGICAL SUPPLY — 46 items
BLADE SURG 15 STRL LF DISP TIS (BLADE) ×1 IMPLANT
BLADE SURG 15 STRL SS (BLADE) ×3
BNDG CMPR 75X41 PLY ABS (GAUZE/BANDAGES/DRESSINGS) ×1
BNDG CMPR 75X41 PLY HI ABS (GAUZE/BANDAGES/DRESSINGS) ×1
BNDG GAUZE ELAST 4 BULKY (GAUZE/BANDAGES/DRESSINGS) ×3 IMPLANT
BNDG STRETCH 4X75 NS LF (GAUZE/BANDAGES/DRESSINGS) ×2 IMPLANT
BNDG STRETCH 4X75 STRL LF (GAUZE/BANDAGES/DRESSINGS) ×3 IMPLANT
CABLE BIPOLOR RESECTION CORD (MISCELLANEOUS) ×3 IMPLANT
COVER WAND RF STERILE (DRAPES) ×3 IMPLANT
DECANTER SPIKE VIAL GLASS SM (MISCELLANEOUS) ×3 IMPLANT
DRAPE EXTREMITY T 121X128X90 (DISPOSABLE) ×3 IMPLANT
DRAPE HALF SHEET 40X57 (DRAPES) ×3 IMPLANT
DRSG EMULSION OIL 3X3 NADH (GAUZE/BANDAGES/DRESSINGS) ×3 IMPLANT
GAUZE 4X4 16PLY RFD (DISPOSABLE) ×3 IMPLANT
GAUZE SPONGE 4X4 12PLY STRL (GAUZE/BANDAGES/DRESSINGS) ×3 IMPLANT
GAUZE SPONGE 4X4 12PLY STRL LF (GAUZE/BANDAGES/DRESSINGS) ×2 IMPLANT
GLOVE BIO SURGEON STRL SZ 6.5 (GLOVE) ×2 IMPLANT
GLOVE BIO SURGEON STRL SZ8 (GLOVE) ×3 IMPLANT
GLOVE BIO SURGEONS STRL SZ 6.5 (GLOVE) ×2
GLOVE BIOGEL PI IND STRL 6.5 (GLOVE) IMPLANT
GLOVE BIOGEL PI IND STRL 7.0 (GLOVE) IMPLANT
GLOVE BIOGEL PI IND STRL 8.5 (GLOVE) ×1 IMPLANT
GLOVE BIOGEL PI INDICATOR 6.5 (GLOVE) ×2
GLOVE BIOGEL PI INDICATOR 7.0 (GLOVE) ×2
GLOVE BIOGEL PI INDICATOR 8.5 (GLOVE) ×2
GLOVE EXAM NITRILE XL STR (GLOVE) IMPLANT
GOWN STRL REUS W/ TWL LRG LVL3 (GOWN DISPOSABLE) ×1 IMPLANT
GOWN STRL REUS W/ TWL XL LVL3 (GOWN DISPOSABLE) IMPLANT
GOWN STRL REUS W/TWL 2XL LVL3 (GOWN DISPOSABLE) IMPLANT
GOWN STRL REUS W/TWL LRG LVL3 (GOWN DISPOSABLE) ×3
GOWN STRL REUS W/TWL XL LVL3 (GOWN DISPOSABLE)
KIT BASIN OR (CUSTOM PROCEDURE TRAY) ×3 IMPLANT
KIT TURNOVER KIT B (KITS) ×3 IMPLANT
NDL HYPO 25X1 1.5 SAFETY (NEEDLE) ×1 IMPLANT
NEEDLE HYPO 25X1 1.5 SAFETY (NEEDLE) ×3 IMPLANT
NS IRRIG 1000ML POUR BTL (IV SOLUTION) ×3 IMPLANT
PACK SURGICAL SETUP 50X90 (CUSTOM PROCEDURE TRAY) ×3 IMPLANT
PAD ARMBOARD 7.5X6 YLW CONV (MISCELLANEOUS) ×9 IMPLANT
STOCKINETTE 4X48 STRL (DRAPES) ×3 IMPLANT
SUT ETHILON 3 0 PS 1 (SUTURE) ×3 IMPLANT
SYR BULB EAR ULCER 3OZ GRN STR (SYRINGE) ×3 IMPLANT
SYR CONTROL 10ML LL (SYRINGE) ×3 IMPLANT
TOWEL GREEN STERILE (TOWEL DISPOSABLE) ×3 IMPLANT
TOWEL GREEN STERILE FF (TOWEL DISPOSABLE) ×3 IMPLANT
UNDERPAD 30X36 HEAVY ABSORB (UNDERPADS AND DIAPERS) ×3 IMPLANT
WATER STERILE IRR 1000ML POUR (IV SOLUTION) ×3 IMPLANT

## 2020-02-25 NOTE — Interval H&P Note (Signed)
History and Physical Interval Note:  02/25/2020 11:57 AM  Jeffrey [name]  has presented today for surgery, with the diagnosis of Bilateral carpal tunnel syndrome.  The various methods of treatment have been discussed with the patient and family. After consideration of risks, benefits and other options for treatment, the patient has consented to  Procedure(s): RIGHT CARPAL TUNNEL RELEASE (Right) as a surgical intervention.  The patient's history has been reviewed, patient examined, no change in status, stable for surgery.  I have reviewed the patient's chart and labs.  Questions were answered to the patient's satisfaction.     Peggyann Shoals

## 2020-02-25 NOTE — Brief Op Note (Signed)
02/25/2020  1:09 PM  PATIENT:  Jeffrey [name]  54 y.o. male  PRE-OPERATIVE DIAGNOSIS:  Bilateral carpal tunnel syndrome  POST-OPERATIVE DIAGNOSIS:  Bilateral carpal tunnel syndrome  PROCEDURE:  Procedure(s): RIGHT CARPAL TUNNEL RELEASE (Right)  SURGEON:  Surgeon(s) and Role:    Erline Levine, MD - Primary  PHYSICIAN ASSISTANT:   ASSISTANTS: none   ANESTHESIA:   local and MAC  EBL: Minimal  BLOOD ADMINISTERED:none  DRAINS: none   LOCAL MEDICATIONS USED:  LIDOCAINE   SPECIMEN:  No Specimen  DISPOSITION OF SPECIMEN:  N/A  COUNTS:  YES  TOURNIQUET:  * No tourniquets in log *  DICTATION:DICTATION:   Patient was given intravenous sedation after right upper extremity was blocked per Anesthesia.  He was positioned with his right arm on the arm board.  His hand and arm were prepped and draped with betadine scrub and Duraprep and sterile stockinet.  Right wrist was infiltrated with lidocaine and an incision was made over a length of 2 cm in line with the fourth ray.  The flexor retinaculum was incised and the carpal tunnel was decompressed.  Decompression was carried into the volar wrist and into the distal palm.  Hemostasis was assured. The wound was irrigated and closed with 3-0 nylon vertical mattress stitches and dressed with a sterile occlusive dressing with Adaptic, fluff gauze, Kerlix and cling wrap. Patient was taken to recovery in stable and satisfactory condition. Counts were correct at the end of the case.  The patient vomited during surgery and was briefly intubated per Anesthesia, out of concern about possible aspiration.    PLAN OF CARE: Discharge to home after PACU  PATIENT DISPOSITION:  PACU - hemodynamically stable.   Delay start of Pharmacological VTE agent (>24hrs) due to surgical blood loss or risk of bleeding: yes

## 2020-02-25 NOTE — H&P (Signed)
Patient ID:                   [id number] Patient:            Jeffrey [name]                     Date of Birth:   [birth year] Visit Type:       Office Visit                               Date:    12/25/2019 11:00 AM Provider:          Marchia Meiers. Vertell Limber MD   This 54 year old male presents for MRI Review/neck pain.  HISTORY OF PRESENT ILLNESS: 1.  MRI Review/neck pain  the patient returns today and we are reviewing his cervical MRI and his physical examination.  He says that he is still having lots of left arm pain.  He has also been diagnosed with significant carpal tunnel syndrome on both sides and has had shots which have some of his arm pain but he is still having a lot of numbness in his hands.  He describes that his EMG testing shows severe bilateral carpal syndrome.    His cervical MRI shows significant degenerative changes at the C4-5, C5-6, C6-7 levels.    His examination is consistent with persistent left arm pain with positive Spurling to the left, left biceps strength at 4/5, left wrist extension strength at 4/5, left triceps strength at 4/5, left wrist flexion strength at 4/5.  The patient has bilaterally positive Tinel's Phalen signs the wrists.    He is working on weight control and has lost 22 lb.  His weight is currently 280 lb.  Says this neck pain has been going on for a long time is very tired  of living with it   the patient has been advised to stop smoking.      Medical/Surgical/Interim History Reviewed, no change.  Last detailed document date:08/26/2019.     PAST MEDICAL HISTORY, SURGICAL HISTORY, FAMILY HISTORY, SOCIAL HISTORY AND REVIEW OF SYSTEMS I have reviewed the patient's past medical, surgical, family and social history as well as the comprehensive review of systems as included on the [facility] history form dated 08/26/2019, which I have signed.  Family History: Reviewed, no changes.  Last detailed document  date:08/26/2019.   Social History: Reviewed, no changes. Last detailed document date: 08/26/2019.    MEDICATIONS: (added, continued or stopped this visit) Started Medication Directions Instruction Stopped  alprazolam 1 mg tablet take 1 tablet by oral route 3 times every day    amlodipine 5 mg tablet     atorvastatin 40 mg tablet take 1 tablet by oral route  every day    carisoprodol 350 mg tablet take 1 by oral route 2 times every day    gabapentin 300 mg capsule take 1 capsule by oral route 3 times every day    glipizide 10 mg tablet take 1 tablet by oral route 2 times every day before a meal    lisinopril 40 mg tablet take 1 tablet by oral route  every day    metformin 500 mg tablet take 1 tablet by oral route 2 times every day with morning and evening meals    pioglitazone 45 mg tablet take 1 tablet by oral route  every day    venlafaxine 37.5  mg tablet       ALLERGIES: Ingredient Reaction Medication Name Comment CODEINE itching    Reviewed, no changes.    PHYSICAL EXAM:  Vitals Date Temp F BP Pulse Ht In Wt Lb BMI BSA Pain Score 12/25/2019  156/89 84 71 280 39.05  5/10     IMPRESSION:    Severe bilateral carpal tunnel syndrome and severe spondylosis and cervical stenosis with multilevel radiculopathies  PLAN:  ACDF C4-5, C5-6, C6-7 with bilateral carpal tunnel release.  This will be staged and the left side will be done 1st because of the severity of his pain.  Preoperative education was performed and the patient was fitted for a cervical collar.  Surgery is be scheduled for  January 16, 2020  Orders: Diagnostic Procedures: Assessment Procedure M54.12 Cervical Spine- Lateral Instruction(s)/Education: Assessment Instruction I10 Lifestyle education Z68.39 Dietary management education, guidance, and counseling  Completed Orders (this  encounter) Order Details Reason Side Interpretation Result Initial Treatment Date Region Lifestyle education Patient will follow up with Primary Care Physician.       Dietary management education, guidance, and counseling Encouraged patient to eat well balanced diet.        Assessment/Plan  # Detail Type Description  1. Assessment Bilateral carpal tunnel syndrome (G56.03).     2. Assessment Herniated nucleus pulposus, C5-6 (M50.222).     3. Assessment Herniated nucleus pulposus, C6-7 (M50.223).     4. Assessment Radiculopathy, cervical region (M54.12).     5. Assessment Cervical stenosis of spinal canal (M48.02).     6. Assessment Essential (primary) hypertension (I10).     7. Assessment Body mass index (BMI) 39.0-39.9, adult (Z68.39).  Plan Orders Today's instructions / counseling include(s) Dietary management education, guidance, and counseling. Clinical information/comments: Encouraged patient to eat well balanced diet.       Pain Management Plan Pain Scale: 5/10. Method: Numeric Pain Intensity Scale. Location: neck. Onset: 09/07/2007. Duration: varies. Quality: discomforting. Pain management follow-up plan of care: Patient will continue medication management..              Provider:  Marchia Meiers. Vertell Limber MD  12/27/2019 07:09 AM    Dictation edited by: Marchia Meiers. Vertell Limber    CC Providers: Cathlean Cower [facility] [place],  [place]  [phone]   Hulan Saas  [address] [place], [place] [address]

## 2020-02-25 NOTE — Anesthesia Preprocedure Evaluation (Signed)
Anesthesia Evaluation    Reviewed: Allergy & Precautions, Patient's Chart, lab work & pertinent test results  Airway Mallampati: II  TM Distance: >3 FB Neck ROM: Full    Dental no notable dental hx.    Pulmonary Current Smoker and Patient abstained from smoking.,    Pulmonary exam normal breath sounds clear to auscultation       Cardiovascular hypertension, Pt. on medications negative cardio ROS Normal cardiovascular exam Rhythm:Regular Rate:Normal     Neuro/Psych PSYCHIATRIC DISORDERS Anxiety Depression    GI/Hepatic Neg liver ROS, PUD, GERD  ,  Endo/Other  diabetes, Well Controlled, Type 2, Oral Hypoglycemic AgentsMorbid obesityObesity BMI 37 a1c 6.9  Renal/GU negative Renal ROS  negative genitourinary   Musculoskeletal  (+) Arthritis , Osteoarthritis,    Abdominal   Peds  Hematology negative hematology ROS (+)   Anesthesia Other Findings Right carpal tunnel syndrome   Reproductive/Obstetrics negative OB ROS                             Anesthesia Physical Anesthesia Plan  ASA: II  Anesthesia Plan: Regional and MAC   Post-op Pain Management:    Induction:   PONV Risk Score and Plan: Propofol infusion, TIVA and Treatment may vary due to age or medical condition  Airway Management Planned: Nasal Cannula and Natural Airway  Additional Equipment: None  Intra-op Plan:   Post-operative Plan:   Informed Consent: I have reviewed the patients History and Physical, chart, labs and discussed the procedure including the risks, benefits and alternatives for the proposed anesthesia with the patient or authorized representative who has indicated his/her understanding and acceptance.     Dental advisory given  Plan Discussed with: CRNA  Anesthesia Plan Comments:         Anesthesia Quick Evaluation

## 2020-02-25 NOTE — Progress Notes (Signed)
Orthopedic Tech Progress Note Patient Details:  Jeffrey [name] 01-27-1966 [id number] PACU RN called requesting an ARM SLING for patient Ortho Devices Type of Ortho Device: Arm sling Ortho Device/Splint Location: RUE Ortho Device/Splint Interventions: Application, Adjustment   Post Interventions Patient Tolerated: Well, Ambulated well Instructions Provided: Poper ambulation with device, Care of device   Janit Pagan 02/25/2020, 2:24 PM

## 2020-02-25 NOTE — Progress Notes (Signed)
Dr. Elgie Congo notified of BP

## 2020-02-25 NOTE — Discharge Instructions (Signed)
Keep arm elevated for 72 hours.

## 2020-02-25 NOTE — Anesthesia Postprocedure Evaluation (Signed)
Anesthesia Post Note  Patient: Jeffrey [name]  Procedure(s) Performed: RIGHT CARPAL TUNNEL RELEASE (Right Wrist)     Patient location during evaluation: PACU Anesthesia Type: Regional, General and MAC Level of consciousness: awake and alert, oriented and patient cooperative Pain management: pain level controlled Vital Signs Assessment: post-procedure vital signs reviewed and stable Respiratory status: spontaneous breathing, nonlabored ventilation and respiratory function stable Cardiovascular status: blood pressure returned to baseline and stable Postop Assessment: no apparent nausea or vomiting Anesthetic complications: no Comments: Conversion from MAC to general because of patient coughing on secretions, inadequate respirations.    No complications documented.  Last Vitals:  Vitals:   02/25/20 1345 02/25/20 1400  BP: 140/85 140/76  Pulse: 90 86  Resp: 18 18  Temp:  36.4 C  SpO2: 94% 94%    Last Pain:  Vitals:   02/25/20 1400  TempSrc:   PainSc: 0-No pain                 Pervis Hocking

## 2020-02-25 NOTE — Op Note (Signed)
02/25/2020  1:09 PM  PATIENT:  Jeffrey [name]  54 y.o. male  PRE-OPERATIVE DIAGNOSIS:  Bilateral carpal tunnel syndrome  POST-OPERATIVE DIAGNOSIS:  Bilateral carpal tunnel syndrome  PROCEDURE:  Procedure(s): RIGHT CARPAL TUNNEL RELEASE (Right)  SURGEON:  Surgeon(s) and Role:    Erline Levine, MD - Primary  PHYSICIAN ASSISTANT:   ASSISTANTS: none   ANESTHESIA:   local and MAC  EBL: Minimal  BLOOD ADMINISTERED:none  DRAINS: none   LOCAL MEDICATIONS USED:  LIDOCAINE   SPECIMEN:  No Specimen  DISPOSITION OF SPECIMEN:  N/A  COUNTS:  YES  TOURNIQUET:  * No tourniquets in log *  DICTATION:DICTATION:   Patient was given intravenous sedation after right upper extremity was blocked per Anesthesia.  He was positioned with his right arm on the arm board.  His hand and arm were prepped and draped with betadine scrub and Duraprep and sterile stockinet.  Right wrist was infiltrated with lidocaine and an incision was made over a length of 2 cm in line with the fourth ray.  The flexor retinaculum was incised and the carpal tunnel was decompressed.  Decompression was carried into the volar wrist and into the distal palm.  Hemostasis was assured. The wound was irrigated and closed with 3-0 nylon vertical mattress stitches and dressed with a sterile occlusive dressing with Adaptic, fluff gauze, Kerlix and cling wrap. Patient was taken to recovery in stable and satisfactory condition. Counts were correct at the end of the case.  The patient vomited during surgery and was briefly intubated per Anesthesia, out of concern about possible aspiration.    PLAN OF CARE: Discharge to home after PACU  PATIENT DISPOSITION:  PACU - hemodynamically stable.   Delay start of Pharmacological VTE agent (>24hrs) due to surgical blood loss or risk of bleeding: yes

## 2020-02-25 NOTE — Transfer of Care (Signed)
Immediate Anesthesia Transfer of Care Note  Patient: Jeffrey [name]  Procedure(s) Performed: RIGHT CARPAL TUNNEL RELEASE (Right Wrist)  Patient Location: PACU  Anesthesia Type:General  Level of Consciousness: awake, alert  and oriented  Airway & Oxygen Therapy: Patient Spontanous Breathing and Patient connected to face mask oxygen  Post-op Assessment: Report given to RN, Post -op Vital signs reviewed and stable and Patient moving all extremities  Post vital signs: Reviewed and stable  Last Vitals:  Vitals Value Taken Time  BP 163/64 02/25/20 1313  Temp    Pulse 106 02/25/20 1319  Resp 23 02/25/20 1319  SpO2 94 % 02/25/20 1319  Vitals shown include unvalidated device data.  Last Pain:  Vitals:   02/25/20 1315  TempSrc:   PainSc: (P) 0-No pain         Complications: No complications documented.

## 2020-02-25 NOTE — Anesthesia Procedure Notes (Addendum)
Procedure Name: Intubation Date/Time: 02/25/2020 12:46 PM Performed by: Leonor Liv, CRNA Pre-anesthesia Checklist: Patient identified, Emergency Drugs available, Suction available, Patient being monitored and Timeout performed Patient Re-evaluated:Patient Re-evaluated prior to induction Preoxygenation: Pre-oxygenation with 100% oxygen Induction Type: IV induction Ventilation: Mask ventilation with difficulty, Oral airway inserted - appropriate to patient size and Two handed mask ventilation required Laryngoscope Size: Mac and 4 Grade View: Grade I Tube size: 7.5 mm Number of attempts: 1 Placement Confirmation: positive ETCO2 Secured at: 23 cm Tube secured with: Tape Dental Injury: Teeth and Oropharynx as per pre-operative assessment

## 2020-02-26 ENCOUNTER — Encounter (HOSPITAL_COMMUNITY): Payer: Self-pay | Admitting: Neurosurgery

## 2020-02-27 NOTE — Discharge Summary (Signed)
Physician Discharge Summary  Patient ID: Jeffrey [name] MRN: [id number] DOB/AGE: [birth year] 54 y.o.  Admit date: 02/25/2020 Discharge date: 02/27/2020  Admission Diagnoses:right carpal tunnel syndrome  Discharge Diagnoses: Same Active Problems:   * No active hospital problems. *   Discharged Condition: good  Hospital Course: Patient underwent right carpal tunnel release.  He did well with surgery and was discharged home immediately postop from the PACU.  Consults: None  Significant Diagnostic Studies: None  Treatments: surgery:  Patient underwent right carpal tunnel release  Discharge Exam: Blood pressure 140/76, pulse 86, temperature 97.6 F (36.4 C), resp. rate 18, height 5\' 11"  (1.803 m), weight 120.2 kg, SpO2 94 %. Neurologic: Alert and oriented X 3, normal strength and tone. Normal symmetric reflexes. Normal coordination and gait Wound:CDI  Disposition: Home  Discharge Instructions     Remove dressing in 72 hours   Complete by: As directed    Diet - low sodium heart healthy   Complete by: As directed    Increase activity slowly   Complete by: As directed      Allergies as of 02/27/2020      Reactions   Codeine Itching      Medication List    TAKE these medications   ALPRAZolam 1 MG tablet Commonly known as: XANAX Take 1 tablet (1 mg total) by mouth 2 (two) times daily as needed. What changed: reasons to take this   amLODipine 5 MG tablet Commonly known as: NORVASC TAKE 1 TABLET BY MOUTH EVERY DAY   aspirin EC 81 MG tablet Take 81 mg by mouth every evening.   atorvastatin 80 MG tablet Commonly known as: LIPITOR TAKE 1 TABLET BY MOUTH EVERY DAY   carisoprodol 350 MG tablet Commonly known as: SOMA TAKE 1 TABLET 3 TIMES A DAY AS NEEDED AS DIRECTED What changed: See the new instructions.   Farxiga 10 MG Tabs tablet Generic drug: dapagliflozin propanediol Take 10 mg by mouth daily before breakfast.   fluticasone 50 MCG/ACT nasal spray Commonly  known as: FLONASE Place 1-2 sprays into both nostrils daily as needed for allergies (sinus congestion.).   gabapentin 300 MG capsule Commonly known as: NEURONTIN Take 1 capsule (300 mg total) by mouth 4 (four) times daily.   glipiZIDE 10 MG 24 hr tablet Commonly known as: GLUCOTROL XL Take 1 tablet (10 mg total) by mouth daily with breakfast.   HYDROcodone-acetaminophen 5-325 MG tablet Commonly known as: NORCO/VICODIN Take 1-2 tablets by mouth every 4 (four) hours as needed for severe pain ((score 7 to 10)).   metFORMIN 500 MG tablet Commonly known as: GLUCOPHAGE TAKE 2 TABLETS BY MOUTH EVERY MORNING AND TAKE 2 TABLETS BY MOUTH EVERY EVENING What changed:   how much to take  how to take this  when to take this  additional instructions   pioglitazone 45 MG tablet Commonly known as: ACTOS Take 1 tablet (45 mg total) by mouth daily. What changed: when to take this   venlafaxine XR 37.5 MG 24 hr capsule Commonly known as: EFFEXOR-XR TAKE 1 CAPSULE BY MOUTH EVERY DAY WITH BREAKFAST   Vitamin D (Ergocalciferol) 1.25 MG (50000 UNIT) Caps capsule Commonly known as: DRISDOL Take 1 capsule (50,000 Units total) by mouth every 7 (seven) days.   Vitamin D 50 MCG (2000 UT) Caps Take 2,000 Units by mouth daily.        Signed: Peggyann Shoals, MD 02/27/2020, 10:05 AM

## 2020-03-06 ENCOUNTER — Other Ambulatory Visit: Payer: Self-pay | Admitting: Internal Medicine

## 2020-03-06 NOTE — Telephone Encounter (Signed)
Done erx 

## 2020-03-09 ENCOUNTER — Other Ambulatory Visit: Payer: 59

## 2020-03-09 DIAGNOSIS — E559 Vitamin D deficiency, unspecified: Secondary | ICD-10-CM

## 2020-03-09 DIAGNOSIS — E119 Type 2 diabetes mellitus without complications: Secondary | ICD-10-CM

## 2020-03-09 NOTE — Addendum Note (Signed)
Addended by: Steward Ros on: 03/09/2020 11:07 AM   Modules accepted: Orders

## 2020-03-10 LAB — LIPID PANEL
Cholesterol: 151 mg/dL (ref <200)
HDL: 30 mg/dL — ABNORMAL LOW (ref >=40)
LDL Cholesterol (Calc): 96 mg/dL (calc)
Non-HDL Cholesterol (Calc): 121 mg/dL (calc) (ref <130)
Total CHOL/HDL Ratio: 5 (calc) — ABNORMAL HIGH (ref <5.0)
Triglycerides: 157 mg/dL — ABNORMAL HIGH (ref <150)

## 2020-03-10 LAB — HEPATIC FUNCTION PANEL
AG Ratio: 2.6 (calc) — ABNORMAL HIGH (ref 1.0–2.5)
ALT: 14 U/L (ref 9–46)
AST: 13 U/L (ref 10–35)
Albumin: 4.4 g/dL (ref 3.6–5.1)
Alkaline phosphatase (APISO): 70 U/L (ref 35–144)
Bilirubin, Direct: 0.1 mg/dL (ref 0.0–0.2)
Globulin: 1.7 g/dL (calc) — ABNORMAL LOW (ref 1.9–3.7)
Indirect Bilirubin: 0.4 mg/dL (calc) (ref 0.2–1.2)
Total Bilirubin: 0.5 mg/dL (ref 0.2–1.2)
Total Protein: 6.1 g/dL (ref 6.1–8.1)

## 2020-03-10 LAB — BASIC METABOLIC PANEL
BUN: 13 mg/dL (ref 7–25)
CO2: 24 mmol/L (ref 20–32)
Calcium: 9.1 mg/dL (ref 8.6–10.3)
Chloride: 108 mmol/L (ref 98–110)
Creat: 0.77 mg/dL (ref 0.70–1.33)
Glucose, Bld: 152 mg/dL — ABNORMAL HIGH (ref 65–99)
Potassium: 4.1 mmol/L (ref 3.5–5.3)
Sodium: 142 mmol/L (ref 135–146)

## 2020-03-10 LAB — HEMOGLOBIN A1C
Hgb A1c MFr Bld: 7.5 % of total Hgb — ABNORMAL HIGH (ref <5.7)
Mean Plasma Glucose: 169 (calc)
eAG (mmol/L): 9.3 (calc)

## 2020-03-10 LAB — VITAMIN D 25 HYDROXY (VIT D DEFICIENCY, FRACTURES): Vit D, 25-Hydroxy: 23 ng/mL — ABNORMAL LOW (ref 30–100)

## 2020-03-12 ENCOUNTER — Other Ambulatory Visit: Payer: Self-pay | Admitting: Internal Medicine

## 2020-03-12 NOTE — Telephone Encounter (Signed)
Done erx 

## 2020-03-13 ENCOUNTER — Encounter: Payer: Self-pay | Admitting: Internal Medicine

## 2020-03-13 ENCOUNTER — Ambulatory Visit: Payer: 59 | Admitting: Internal Medicine

## 2020-03-13 VITALS — BP 140/86 | HR 91 | Temp 98.0°F | Ht 71.0 in | Wt 271.0 lb

## 2020-03-13 DIAGNOSIS — E559 Vitamin D deficiency, unspecified: Secondary | ICD-10-CM | POA: Diagnosis not present

## 2020-03-13 DIAGNOSIS — E119 Type 2 diabetes mellitus without complications: Secondary | ICD-10-CM

## 2020-03-13 DIAGNOSIS — E785 Hyperlipidemia, unspecified: Secondary | ICD-10-CM

## 2020-03-13 DIAGNOSIS — I10 Essential (primary) hypertension: Secondary | ICD-10-CM | POA: Diagnosis not present

## 2020-03-13 DIAGNOSIS — Z0000 Encounter for general adult medical examination without abnormal findings: Secondary | ICD-10-CM

## 2020-03-13 NOTE — Progress Notes (Signed)
Subjective:    Patient ID: Jeffrey [name], male    DOB: [birth year], 54 y.o.   MRN: [id number]  HPI  Here to f/u; overall doing ok,  Pt denies chest pain, increasing sob or doe, wheezing, orthopnea, PND, increased LE swelling, palpitations, dizziness or syncope.  Pt denies new neurological symptoms such as new headache, or facial or extremity weakness or numbness.  Pt denies polydipsia, polyuria, or low sugar episode.  Pt states overall good compliance with meds, mostly trying to follow appropriate diet, with wt overall stable,  but little exercise however Recent s/p left cts, cervical fusion, then right cts and overall doing well.  Some decreased activity around the surguries Wt Readings from Last 3 Encounters:  02/25/20 265 lb (120.2 kg)  03/13/20 (!) 271 lb (122.9 kg)  02/20/20 274 lb (124.3 kg)   Past Medical History:  Diagnosis Date  . Allergy   . ANXIETY 07/05/2007  . Arthritis 08/2015   neck and back  . Cancer ([place])    basal cell  . CARPAL TUNNEL SYNDROME, BILATERAL 07/05/2007  . Cervicalgia 03/18/2008  . DEPRESSION 07/05/2007  . DIABETES MELLITUS, TYPE II 07/05/2007  . ERECTILE DYSFUNCTION 07/11/2007  . GERD 07/11/2007  . HYPERLIPIDEMIA 07/05/2007  . HYPERTENSION 07/05/2007  . LOW BACK PAIN 07/11/2007  . Meralgia paresthetica 09/25/2008  . PEPTIC ULCER DISEASE 07/05/2007  . SINUSITIS- ACUTE-NOS 03/26/2009  . SKIN LESION 03/18/2008   Past Surgical History:  Procedure Laterality Date  . ANTERIOR CERVICAL DECOMP/DISCECTOMY FUSION N/A 01/16/2020   Procedure: Cervical 4-5 Cervical 5-6 Cervical 6-7 Anterior cervical decompression/discectomy/fusion;  Surgeon: Erline Levine, MD;  Location: [facility];  Service: Neurosurgery;  Laterality: N/A;  3C  . BASAL CELL CARCINOMA EXCISION Right 2011   shoulder area  . CARPAL TUNNEL RELEASE Left 01/16/2020   Procedure: LEFT CARPAL TUNNEL RELEASE;  Surgeon: Erline Levine, MD;  Location: [facility];  Service: Neurosurgery;  Laterality: Left;  3C  . CARPAL  TUNNEL RELEASE Right 02/25/2020   Procedure: RIGHT CARPAL TUNNEL RELEASE;  Surgeon: Erline Levine, MD;  Location: [facility];  Service: Neurosurgery;  Laterality: Right;  . FRACTURE SURGERY    . left wrist surgury/fracture  1985  . SHOULDER ARTHROSCOPY WITH SUBACROMIAL DECOMPRESSION Left 03/30/2017   Procedure: SHOULDER ARTHROSCOPY WITH DEBRIDEMENT ROTATOR CUFF,SUBACROMIAL DECOMPRESSION AND OPEN TENODESIS;  Surgeon: [name], MD;  Location: [facility];  Service: Orthopedics;  Laterality: Left;  SHOULDER ARTHROSCOPY WITH DEBRIDEMENT ROTATOR CUFF,SUBACROMIAL DECOMPRESSION AND POSSIBLE OPEN TENODESIS  . WISDOM TOOTH EXTRACTION      reports that he has been smoking cigars. He uses smokeless tobacco. He reports current alcohol use. He reports that he does not use drugs. family history includes Cancer in his maternal grandfather; Diabetes in his father, mother, and another family member; Heart disease in his maternal grandmother; Hypertension in his father, mother, and another family member. Allergies  Allergen Reactions  . Codeine Itching   Current Facility-Administered Medications on File Prior to Visit  Medication Dose Route Frequency Provider Last Rate Last Admin  . oxyCODONE (Oxy IR/ROXICODONE) immediate release tablet 5 mg  5 mg Oral Once PRN Pervis Hocking, DO       Or  . oxyCODONE (ROXICODONE) 5 MG/5ML solution 5 mg  5 mg Oral Once PRN Nunzio Cobbs M, DO      . promethazine (PHENERGAN) injection 6.25-12.5 mg  6.25-12.5 mg Intravenous Q15 min PRN Pervis Hocking, DO       Current Outpatient Medications on File Prior to  Visit  Medication Sig Dispense Refill  . amLODipine (NORVASC) 5 MG tablet TAKE 1 TABLET BY MOUTH EVERY DAY 90 tablet 3  . aspirin EC 81 MG tablet Take 81 mg by mouth every evening.    Marland Kitchen atorvastatin (LIPITOR) 80 MG tablet TAKE 1 TABLET BY MOUTH EVERY DAY 90 tablet 3  . carisoprodol (SOMA) 350 MG tablet TAKE 1 TABLET 3 TIMES A DAY AS NEEDED AS DIRECTED 90  tablet 2  . dapagliflozin propanediol (FARXIGA) 10 MG TABS tablet Take 10 mg by mouth daily before breakfast. 90 tablet 3  . fluticasone (FLONASE) 50 MCG/ACT nasal spray Place 1-2 sprays into both nostrils daily as needed for allergies (sinus congestion.).    Marland Kitchen glipiZIDE (GLUCOTROL XL) 10 MG 24 hr tablet Take 1 tablet (10 mg total) by mouth daily with breakfast. 90 tablet 3  . metFORMIN (GLUCOPHAGE) 500 MG tablet TAKE 2 TABLETS BY MOUTH EVERY MORNING AND TAKE 2 TABLETS BY MOUTH EVERY EVENING 360 tablet 3  . pioglitazone (ACTOS) 45 MG tablet TAKE 1 TABLET BY MOUTH EVERY DAY 90 tablet 3   Review of Systems All otherwise neg per pt     Objective:   Physical Exam BP (!) 140/86 (BP Location: Left Arm, Patient Position: Sitting, Cuff Size: Large)   Pulse 91   Temp 98 F (36.7 C) (Oral)   Ht 5\' 11"  (1.803 m)   Wt (!) 271 lb (122.9 kg)   SpO2 93%   BMI 37.80 kg/m  VS noted,  Constitutional: Pt appears in NAD HENT: Head: NCAT.  Right Ear: External ear normal.  Left Ear: External ear normal.  Eyes: . Pupils are equal, round, and reactive to light. Conjunctivae and EOM are normal Nose: without d/c or deformity Neck: Neck supple. Gross normal ROM Cardiovascular: Normal rate and regular rhythm.   Pulmonary/Chest: Effort normal and breath sounds without rales or wheezing.  Abd:  Soft, NT, ND, + BS, no organomegaly Neurological: Pt is alert. At baseline orientation, motor grossly intact Skin: Skin is warm. No rashes, other new lesions, no LE edema Psychiatric: Pt behavior is normal without agitation  All otherwise neg per pt Lab Results  Component Value Date   WBC 6.5 02/20/2020   HGB 17.6 (H) 02/20/2020   HCT 53.2 (H) 02/20/2020   PLT 218 02/20/2020   GLUCOSE 152 (H) 03/09/2020   CHOL 151 03/09/2020   TRIG 157 (H) 03/09/2020   HDL 30 (L) 03/09/2020   LDLDIRECT 90.0 08/18/2015   LDLCALC 96 03/09/2020   ALT 14 03/09/2020   AST 13 03/09/2020   NA 142 03/09/2020   K 4.1 03/09/2020     CL 108 03/09/2020   CREATININE 0.77 03/09/2020   BUN 13 03/09/2020   CO2 24 03/09/2020   TSH 1.45 09/10/2019   PSA 0.38 09/10/2019   HGBA1C 7.5 (H) 03/09/2020   MICROALBUR <0.7 09/10/2019      Assessment & Plan:

## 2020-03-13 NOTE — Patient Instructions (Signed)
Please continue all other medications as before, and refills have been done if requested.  Please have the pharmacy call with any other refills you may need.  Please continue your efforts at being more active, low cholesterol diet, and weight control.  Please keep your appointments with your specialists as you may have planned  Please make an Appointment to return in 6 months, or sooner if needed, also with Lab Appointment for testing done 3-5 days before at the FIRST FLOOR Lab (so this is for TWO appointments - please see the scheduling desk as you leave)  

## 2020-03-15 ENCOUNTER — Encounter: Payer: Self-pay | Admitting: Internal Medicine

## 2020-03-15 NOTE — Assessment & Plan Note (Signed)
stable overall by history and exam, recent data reviewed with pt, and pt to continue medical treatment as before,  to f/u any worsening symptoms or concerns  

## 2020-03-15 NOTE — Assessment & Plan Note (Addendum)
Cont oral replacement  I spent 31 minutes in preparing to see the patient by review of recent labs, imaging and procedures, obtaining and reviewing separately obtained history, communicating with the patient and family or caregiver, ordering medications, tests or procedures, and documenting clinical information in the EHR including the differential Dx, treatment, and any further evaluation and other management of vit d deficiency, htn, hld, dm

## 2020-03-15 NOTE — Assessment & Plan Note (Signed)
Mild worsening o/w stable overall by history and exam, recent data reviewed with pt, and pt to continue medical treatment as before,  to f/u any worsening symptoms or concerns, declines med change, to work on diet

## 2020-03-15 NOTE — Assessment & Plan Note (Signed)
stable overall by history and exam, recent data reviewed with pt, and pt to continue medical treatment as before,  to f/u any worsening symptoms or concerns, declines further med change, to work on diet

## 2020-04-05 ENCOUNTER — Encounter: Payer: Self-pay | Admitting: Internal Medicine

## 2020-04-22 ENCOUNTER — Other Ambulatory Visit: Payer: Self-pay | Admitting: Internal Medicine

## 2020-06-14 ENCOUNTER — Other Ambulatory Visit: Payer: Self-pay | Admitting: Internal Medicine

## 2020-07-20 ENCOUNTER — Other Ambulatory Visit: Payer: Self-pay | Admitting: Internal Medicine

## 2020-07-20 NOTE — Telephone Encounter (Signed)
Done erx 

## 2020-07-23 LAB — HM DIABETES EYE EXAM

## 2020-08-03 ENCOUNTER — Encounter: Payer: Self-pay | Admitting: Internal Medicine

## 2020-08-17 ENCOUNTER — Encounter: Payer: Self-pay | Admitting: Internal Medicine

## 2020-09-09 ENCOUNTER — Other Ambulatory Visit (INDEPENDENT_AMBULATORY_CARE_PROVIDER_SITE_OTHER): Payer: 59

## 2020-09-09 ENCOUNTER — Other Ambulatory Visit: Payer: Self-pay

## 2020-09-09 DIAGNOSIS — Z0000 Encounter for general adult medical examination without abnormal findings: Secondary | ICD-10-CM

## 2020-09-09 DIAGNOSIS — Z125 Encounter for screening for malignant neoplasm of prostate: Secondary | ICD-10-CM

## 2020-09-09 DIAGNOSIS — E119 Type 2 diabetes mellitus without complications: Secondary | ICD-10-CM

## 2020-09-09 DIAGNOSIS — E559 Vitamin D deficiency, unspecified: Secondary | ICD-10-CM | POA: Diagnosis not present

## 2020-09-09 LAB — PSA: PSA: 0.5 ng/mL (ref 0.10–4.00)

## 2020-09-09 LAB — COMPREHENSIVE METABOLIC PANEL
ALT: 18 U/L (ref 0–53)
AST: 14 U/L (ref 0–37)
Albumin: 4.5 g/dL (ref 3.5–5.2)
Alkaline Phosphatase: 71 U/L (ref 39–117)
BUN: 14 mg/dL (ref 6–23)
CO2: 26 mEq/L (ref 19–32)
Calcium: 9.4 mg/dL (ref 8.4–10.5)
Chloride: 105 mEq/L (ref 96–112)
Creatinine, Ser: 0.75 mg/dL (ref 0.40–1.50)
GFR: 100.82 mL/min (ref >60.00)
Glucose, Bld: 131 mg/dL — ABNORMAL HIGH (ref 70–99)
Potassium: 4.2 mEq/L (ref 3.5–5.1)
Sodium: 140 mEq/L (ref 135–145)
Total Bilirubin: 0.8 mg/dL (ref 0.2–1.2)
Total Protein: 6.5 g/dL (ref 6.0–8.3)

## 2020-09-09 LAB — CBC WITH DIFFERENTIAL/PLATELET
Basophils Absolute: 0 10*3/uL (ref 0.0–0.1)
Basophils Relative: 0.6 % (ref 0.0–3.0)
Eosinophils Absolute: 0.1 10*3/uL (ref 0.0–0.7)
Eosinophils Relative: 1.7 % (ref 0.0–5.0)
HCT: 51.9 % (ref 39.0–52.0)
Hemoglobin: 17.7 g/dL — ABNORMAL HIGH (ref 13.0–17.0)
Lymphocytes Relative: 19.9 % (ref 12.0–46.0)
Lymphs Abs: 1.4 10*3/uL (ref 0.7–4.0)
MCHC: 34.2 g/dL (ref 30.0–36.0)
MCV: 92.3 fl (ref 78.0–100.0)
Monocytes Absolute: 0.8 10*3/uL (ref 0.1–1.0)
Monocytes Relative: 10.6 % (ref 3.0–12.0)
Neutro Abs: 4.8 10*3/uL (ref 1.4–7.7)
Neutrophils Relative %: 67.2 % (ref 43.0–77.0)
Platelets: 247 10*3/uL (ref 150.0–400.0)
RBC: 5.62 Mil/uL (ref 4.22–5.81)
RDW: 13.9 % (ref 11.5–15.5)
WBC: 7.2 10*3/uL (ref 4.0–10.5)

## 2020-09-09 LAB — URINALYSIS, ROUTINE W REFLEX MICROSCOPIC
Bilirubin Urine: NEGATIVE
Hgb urine dipstick: NEGATIVE
Leukocytes,Ua: NEGATIVE
Nitrite: NEGATIVE
RBC / HPF: NONE SEEN (ref 0–?)
Specific Gravity, Urine: 1.02 (ref 1.000–1.030)
Total Protein, Urine: NEGATIVE
Urine Glucose: >=1000 — AB
Urobilinogen, UA: 0.2 (ref 0.0–1.0)
pH: 5.5 (ref 5.0–8.0)

## 2020-09-09 LAB — MICROALBUMIN / CREATININE URINE RATIO
Creatinine,U: 61.7 mg/dL
Microalb Creat Ratio: 1.1 mg/g (ref 0.0–30.0)
Microalb, Ur: <0.7 mg/dL (ref 0.0–1.9)

## 2020-09-09 LAB — VITAMIN D 25 HYDROXY (VIT D DEFICIENCY, FRACTURES): VITD: 27.7 ng/mL — ABNORMAL LOW (ref 30.00–100.00)

## 2020-09-09 LAB — LIPID PANEL
Cholesterol: 118 mg/dL (ref 0–200)
HDL: 35.6 mg/dL — ABNORMAL LOW (ref >39.00)
LDL Cholesterol: 68 mg/dL (ref 0–99)
NonHDL: 82.57
Total CHOL/HDL Ratio: 3
Triglycerides: 74 mg/dL (ref 0.0–149.0)
VLDL: 14.8 mg/dL (ref 0.0–40.0)

## 2020-09-09 LAB — HEMOGLOBIN A1C: Hgb A1c MFr Bld: 7.1 % — ABNORMAL HIGH (ref 4.6–6.5)

## 2020-09-09 LAB — TSH: TSH: 0.82 u[IU]/mL (ref 0.35–4.50)

## 2020-09-09 NOTE — Addendum Note (Signed)
Addended by: Hasaan Radde on: 09/09/2020 12:56 PM   Modules accepted: Orders  

## 2020-09-09 NOTE — Addendum Note (Signed)
Addended by: Jacob Moores on: 09/09/2020 12:56 PM   Modules accepted: Orders

## 2020-09-11 ENCOUNTER — Ambulatory Visit: Payer: 59 | Admitting: Internal Medicine

## 2020-09-14 ENCOUNTER — Encounter: Payer: Self-pay | Admitting: Internal Medicine

## 2020-09-14 ENCOUNTER — Other Ambulatory Visit: Payer: Self-pay

## 2020-09-14 ENCOUNTER — Ambulatory Visit: Payer: 59 | Admitting: Internal Medicine

## 2020-09-14 VITALS — BP 162/84 | HR 87 | Temp 98.6°F | Ht 71.0 in | Wt 265.0 lb

## 2020-09-14 DIAGNOSIS — E785 Hyperlipidemia, unspecified: Secondary | ICD-10-CM | POA: Diagnosis not present

## 2020-09-14 DIAGNOSIS — D049 Carcinoma in situ of skin, unspecified: Secondary | ICD-10-CM | POA: Diagnosis not present

## 2020-09-14 DIAGNOSIS — I10 Essential (primary) hypertension: Secondary | ICD-10-CM

## 2020-09-14 DIAGNOSIS — C4491 Basal cell carcinoma of skin, unspecified: Secondary | ICD-10-CM

## 2020-09-14 DIAGNOSIS — E559 Vitamin D deficiency, unspecified: Secondary | ICD-10-CM

## 2020-09-14 DIAGNOSIS — Z0001 Encounter for general adult medical examination with abnormal findings: Secondary | ICD-10-CM | POA: Diagnosis not present

## 2020-09-14 DIAGNOSIS — E1165 Type 2 diabetes mellitus with hyperglycemia: Secondary | ICD-10-CM

## 2020-09-14 MED ORDER — OLMESARTAN MEDOXOMIL 20 MG PO TABS: 20.0000 mg | ORAL_TABLET | Freq: Every day | ORAL | 3 refills | Status: DC

## 2020-09-14 MED ORDER — CARISOPRODOL 350 MG PO TABS: ORAL_TABLET | ORAL | 2 refills | Status: DC

## 2020-09-14 MED ORDER — DAPAGLIFLOZIN PROPANEDIOL 10 MG PO TABS: 10.0000 mg | ORAL_TABLET | Freq: Every day | ORAL | 3 refills | Status: DC

## 2020-09-14 MED ORDER — TRIAMCINOLONE ACETONIDE 55 MCG/ACT NA AERO
2.0000 | INHALATION_SPRAY | Freq: Every day | NASAL | 3 refills | Status: DC
Start: 2020-09-14 — End: 2024-07-09

## 2020-09-14 MED ORDER — GLIPIZIDE ER 10 MG PO TB24: 10.0000 mg | ORAL_TABLET | Freq: Every day | ORAL | 3 refills | Status: DC

## 2020-09-14 NOTE — Progress Notes (Signed)
Established Patient Office Visit  Subjective:  Patient ID: Jeffrey [name], male    DOB: 20-Aug-1965  Age: 55 y.o. MRN: [id number]        Chief Complaint:: wellness exam and Follow-up  HTN, HLD, low vitd , and smoking       HPI:  Jeffrey [name] is a 55 y.o. male here for wellness exam; overall doing well , o/w up to date, still smoking and not ready to quit. Wt overall stable   Wt Readings from Last 3 Encounters:  09/14/20 265 lb (120.2 kg)  03/13/20 (!) 271 lb (122.9 kg)  02/25/20 265 lb (120.2 kg)   BP Readings from Last 3 Encounters:  09/14/20 (!) 162/84  03/13/20 (!) 140/86  02/25/20 140/76   Immunization History  Administered Date(s) Administered  . H1N1 09/25/2008  . Influenza Inj Mdck Quad Pf 05/25/2017, 05/12/2018, 04/25/2019  . Influenza Split 07/04/2011  . Influenza Whole 09/24/2009  . Influenza,inj,Quad PF,6+ Mos 05/24/2013, 08/18/2015, 05/07/2020  . Influenza-Unspecified 06/15/2016  . PFIZER(Purple Top)SARS-COV-2 Vaccination 11/08/2019, 12/03/2019, 07/02/2020  . Pneumococcal Conjugate-13 08/31/2015  . Pneumococcal Polysaccharide-23 08/23/2016  . Td 09/25/2008  . Tdap 03/01/2019  There are no preventive care reminders to display for this patient.       Also BP has been increased recently, Pt denies chest pain, increased sob or doe, wheezing, orthopnea, PND, increased LE swelling, palpitations, dizziness or syncope.  Currently taking vit d 2000 u qd.  Has hx of basal cell ca, asks for f/u derm appt  Past Medical History:  Diagnosis Date  . Allergy   . ANXIETY 07/05/2007  . Arthritis 08/2015   neck and back  . Cancer ([place])    basal cell  . CARPAL TUNNEL SYNDROME, BILATERAL 07/05/2007  . Cervicalgia 03/18/2008  . DEPRESSION 07/05/2007  . DIABETES MELLITUS, TYPE II 07/05/2007  . ERECTILE DYSFUNCTION 07/11/2007  . GERD 07/11/2007  . HYPERLIPIDEMIA 07/05/2007  . HYPERTENSION 07/05/2007  . LOW BACK PAIN 07/11/2007  . Meralgia paresthetica 09/25/2008  .  PEPTIC ULCER DISEASE 07/05/2007  . SINUSITIS- ACUTE-NOS 03/26/2009  . SKIN LESION 03/18/2008   Past Surgical History:  Procedure Laterality Date  . ANTERIOR CERVICAL DECOMP/DISCECTOMY FUSION N/A 01/16/2020   Procedure: Cervical 4-5 Cervical 5-6 Cervical 6-7 Anterior cervical decompression/discectomy/fusion;  Surgeon: Erline Levine, MD;  Location: [facility];  Service: Neurosurgery;  Laterality: N/A;  3C  . BASAL CELL CARCINOMA EXCISION Right 2011   shoulder area  . CARPAL TUNNEL RELEASE Left 01/16/2020   Procedure: LEFT CARPAL TUNNEL RELEASE;  Surgeon: Erline Levine, MD;  Location: [facility];  Service: Neurosurgery;  Laterality: Left;  3C  . CARPAL TUNNEL RELEASE Right 02/25/2020   Procedure: RIGHT CARPAL TUNNEL RELEASE;  Surgeon: Erline Levine, MD;  Location: [facility];  Service: Neurosurgery;  Laterality: Right;  . FRACTURE SURGERY    . left wrist surgury/fracture  1985  . SHOULDER ARTHROSCOPY WITH SUBACROMIAL DECOMPRESSION Left 03/30/2017   Procedure: SHOULDER ARTHROSCOPY WITH DEBRIDEMENT ROTATOR CUFF,SUBACROMIAL DECOMPRESSION AND OPEN TENODESIS;  Surgeon: [name], MD;  Location: [facility];  Service: Orthopedics;  Laterality: Left;  SHOULDER ARTHROSCOPY WITH DEBRIDEMENT ROTATOR CUFF,SUBACROMIAL DECOMPRESSION AND POSSIBLE OPEN TENODESIS  . WISDOM TOOTH EXTRACTION      reports that he has been smoking cigars. He uses smokeless tobacco. He reports current alcohol use. He reports that he does not use drugs. family history includes Cancer in his maternal grandfather; Diabetes in his father, mother, and another family member; Heart disease in his maternal grandmother; Hypertension  in his father, mother, and another family member. Allergies  Allergen Reactions  . Codeine Itching   Current Outpatient Medications on File Prior to Visit  Medication Sig Dispense Refill  . ALPRAZolam (XANAX) 1 MG tablet TAKE 1 TABLET BY MOUTH TWICE A DAY AS NEEDED 60 tablet 5  . amLODipine (NORVASC) 5 MG tablet TAKE 1 TABLET BY  MOUTH EVERY DAY 90 tablet 3  . aspirin EC 81 MG tablet Take 81 mg by mouth every evening.    Marland Kitchen atorvastatin (LIPITOR) 80 MG tablet TAKE 1 TABLET BY MOUTH EVERY DAY 90 tablet 3  . metFORMIN (GLUCOPHAGE) 500 MG tablet TAKE 2 TABLETS BY MOUTH EVERY MORNING AND TAKE 2 TABLETS BY MOUTH EVERY EVENING 360 tablet 3  . pioglitazone (ACTOS) 45 MG tablet TAKE 1 TABLET BY MOUTH EVERY DAY 90 tablet 3   No current facility-administered medications on file prior to visit.        ROS:  All others reviewed and negative.  Objective        PE:  BP (!) 162/84   Pulse 87   Temp 98.6 F (37 C) (Oral)   Ht 5\' 11"  (1.803 m)   Wt 265 lb (120.2 kg)   SpO2 97%   BMI 36.96 kg/m                 Constitutional: Pt appears in NAD               HENT: Head: NCAT.                Right Ear: External ear normal.                 Left Ear: External ear normal.                Eyes: . Pupils are equal, round, and reactive to light. Conjunctivae and EOM are normal               Nose: without d/c or deformity               Neck: Neck supple. Gross normal ROM               Cardiovascular: Normal rate and regular rhythm.                 Pulmonary/Chest: Effort normal and breath sounds without rales or wheezing.                Abd:  Soft, NT, ND, + BS, no organomegaly               Neurological: Pt is alert. At baseline orientation, motor grossly intact               Skin: Skin is warm. No rashes, no other new lesions, LE edema - none               Psychiatric: Pt behavior is normal without agitation   Micro: none  Cardiac tracings I have personally interpreted today:  none  Pertinent Radiological findings (summarize): none   Lab Results  Component Value Date   WBC 7.2 09/09/2020   HGB 17.7 (H) 09/09/2020   HCT 51.9 09/09/2020   PLT 247.0 09/09/2020   GLUCOSE 131 (H) 09/09/2020   CHOL 118 09/09/2020   TRIG 74.0 09/09/2020   HDL 35.60 (L) 09/09/2020   LDLDIRECT 90.0 08/18/2015   LDLCALC 68 09/09/2020   ALT  18 09/09/2020   AST 14  09/09/2020   NA 140 09/09/2020   K 4.2 09/09/2020   CL 105 09/09/2020   CREATININE 0.75 09/09/2020   BUN 14 09/09/2020   CO2 26 09/09/2020   TSH 0.82 09/09/2020   PSA 0.50 09/09/2020   HGBA1C 7.1 (H) 09/09/2020   MICROALBUR <0.7 09/09/2020   Assessment/Plan:  KHADAR CONBOY is a 55 y.o. White or Caucasian [1] male with  has a past medical history of Allergy, ANXIETY (07/05/2007), Arthritis (08/2015), Cancer ([place]), CARPAL TUNNEL SYNDROME, BILATERAL (07/05/2007), Cervicalgia (03/18/2008), DEPRESSION (07/05/2007), DIABETES MELLITUS, TYPE II (07/05/2007), ERECTILE DYSFUNCTION (07/11/2007), GERD (07/11/2007), HYPERLIPIDEMIA (07/05/2007), HYPERTENSION (07/05/2007), LOW BACK PAIN (07/11/2007), Meralgia paresthetica (09/25/2008), PEPTIC ULCER DISEASE (07/05/2007), SINUSITIS- ACUTE-NOS (03/26/2009), and SKIN LESION (03/18/2008). Encounter for well adult exam with abnormal findings Age and sex appropriate education and counseling updated with regular exercise and diet Referrals for preventative services - none needed Immunizations addressed - none needed Smoking counseling  - pt not ready to quit Evidence for depression or other mood disorder - none significant Most recent labs reviewed. I have personally reviewed and have noted: 1) the patient's medical and social history 2) The patient's current medications and supplements 3) The patient's height, weight, and BMI have been recorded in the chart   Hyperlipidemia . Lab Results  Component Value Date   LDLCALC 68 09/09/2020   Stable, pt to continue current diet, declines statin, but will have ct cardiac score and consider in future  Essential hypertension BP Readings from Last 3 Encounters:  09/14/20 (!) 162/84  03/13/20 (!) 140/86  02/25/20 140/76   Uncontrolled, pt to add benicar 40 mg per day f/u bp at home and next visit  Current Outpatient Medications (Endocrine & Metabolic):  .  metFORMIN (GLUCOPHAGE) 500 MG  tablet, TAKE 2 TABLETS BY MOUTH EVERY MORNING AND TAKE 2 TABLETS BY MOUTH EVERY EVENING .  pioglitazone (ACTOS) 45 MG tablet, TAKE 1 TABLET BY MOUTH EVERY DAY .  dapagliflozin propanediol (FARXIGA) 10 MG TABS tablet, Take 1 tablet (10 mg total) by mouth daily before breakfast. .  glipiZIDE (GLUCOTROL XL) 10 MG 24 hr tablet, Take 1 tablet (10 mg total) by mouth daily with breakfast.  Current Outpatient Medications (Cardiovascular):  .  amLODipine (NORVASC) 5 MG tablet, TAKE 1 TABLET BY MOUTH EVERY DAY .  atorvastatin (LIPITOR) 80 MG tablet, TAKE 1 TABLET BY MOUTH EVERY DAY .  olmesartan (BENICAR) 20 MG tablet, Take 1 tablet (20 mg total) by mouth daily.  Current Outpatient Medications (Respiratory):  .  triamcinolone (NASACORT) 55 MCG/ACT AERO nasal inhaler, Place 2 sprays into the nose daily.  Current Outpatient Medications (Analgesics):  .  aspirin EC 81 MG tablet, Take 81 mg by mouth every evening.   Current Outpatient Medications (Other):  Marland Kitchen  ALPRAZolam (XANAX) 1 MG tablet, TAKE 1 TABLET BY MOUTH TWICE A DAY AS NEEDED .  carisoprodol (SOMA) 350 MG tablet, TAKE 1 TABLET BY MOUTH 3 TIMES A DAY AS NEEDED AS DIRECTED   Diabetes ([facility]CC) Lab Results  Component Value Date   HGBA1C 7.1 (H) 09/09/2020   Stable, pt to continue current medical treatment metformin, farxiga, actos, glipizide   Skin cancer, basal cell Will need referral derm for f/u  Vitamin D deficiency Last vitamin D Lab Results  Component Value Date   VD25OH 27.70 (L) 09/09/2020   Low vit d, for increased oral replacement to 4000 u qd  Followup: Return in about 6 months (around 03/14/2021).  Cathlean Cower, MD 09/20/2020 9:05 PM [place]  [facility]

## 2020-09-14 NOTE — Patient Instructions (Signed)
Please take all new medication as prescribed - the benicar 20 mg per day for blood pressure  We have discussed the Cardiac CT Score test to measure the calcification level (if any) in your heart arteries.  This test has been ordered in our [place], so please call [facility] directly, as they prefer this, at [phone] to be scheduled.  Ok to increase the Vitamin D to 4000 units per day  Please stop smoking  You will be contacted regarding the referral for: Dermatology  Please continue all other medications as before, and refills have been done if requested.  Please have the pharmacy call with any other refills you may need.  Please continue your efforts at being more active, low cholesterol diet, and weight control.  You are otherwise up to date with prevention measures today.  Please keep your appointments with your specialists as you may have planned  Please make an Appointment to return in 6 months, or sooner if needed, also with Lab Appointment for testing done 3-5 days before at the [facility] [place] (so this is for TWO appointments - please see the scheduling desk as you leave)  Due to the ongoing Covid 19 pandemic, our lab now requires an appointment for any labs done at our office.  If you need labs done and do not have an appointment, please call our office ahead of time to schedule before presenting to the lab for your testing.

## 2020-09-20 ENCOUNTER — Encounter: Payer: Self-pay | Admitting: Internal Medicine

## 2020-09-20 NOTE — Assessment & Plan Note (Signed)
Last vitamin D Lab Results  Component Value Date   VD25OH 27.70 (L) 09/09/2020   Low vit d, for increased oral replacement to 4000 u qd

## 2020-09-20 NOTE — Assessment & Plan Note (Signed)
Lab Results  Component Value Date   HGBA1C 7.1 (H) 09/09/2020   Stable, pt to continue current medical treatment metformin, farxiga, actos, glipizide

## 2020-09-20 NOTE — Assessment & Plan Note (Signed)
Age and sex appropriate education and counseling updated with regular exercise and diet Referrals for preventative services - none needed Immunizations addressed - none needed Smoking counseling  - pt not ready to quit Evidence for depression or other mood disorder - none significant Most recent labs reviewed. I have personally reviewed and have noted: 1) the patient's medical and social history 2) The patient's current medications and supplements 3) The patient's height, weight, and BMI have been recorded in the chart

## 2020-09-20 NOTE — Assessment & Plan Note (Signed)
BP Readings from Last 3 Encounters:  09/14/20 (!) 162/84  03/13/20 (!) 140/86  02/25/20 140/76   Uncontrolled, pt to add benicar 40 mg per day f/u bp at home and next visit  Current Outpatient Medications (Endocrine & Metabolic):  .  metFORMIN (GLUCOPHAGE) 500 MG tablet, TAKE 2 TABLETS BY MOUTH EVERY MORNING AND TAKE 2 TABLETS BY MOUTH EVERY EVENING .  pioglitazone (ACTOS) 45 MG tablet, TAKE 1 TABLET BY MOUTH EVERY DAY .  dapagliflozin propanediol (FARXIGA) 10 MG TABS tablet, Take 1 tablet (10 mg total) by mouth daily before breakfast. .  glipiZIDE (GLUCOTROL XL) 10 MG 24 hr tablet, Take 1 tablet (10 mg total) by mouth daily with breakfast.  Current Outpatient Medications (Cardiovascular):  .  amLODipine (NORVASC) 5 MG tablet, TAKE 1 TABLET BY MOUTH EVERY DAY .  atorvastatin (LIPITOR) 80 MG tablet, TAKE 1 TABLET BY MOUTH EVERY DAY .  olmesartan (BENICAR) 20 MG tablet, Take 1 tablet (20 mg total) by mouth daily.  Current Outpatient Medications (Respiratory):  .  triamcinolone (NASACORT) 55 MCG/ACT AERO nasal inhaler, Place 2 sprays into the nose daily.  Current Outpatient Medications (Analgesics):  .  aspirin EC 81 MG tablet, Take 81 mg by mouth every evening.   Current Outpatient Medications (Other):  Marland Kitchen  ALPRAZolam (XANAX) 1 MG tablet, TAKE 1 TABLET BY MOUTH TWICE A DAY AS NEEDED .  carisoprodol (SOMA) 350 MG tablet, TAKE 1 TABLET BY MOUTH 3 TIMES A DAY AS NEEDED AS DIRECTED

## 2020-09-20 NOTE — Assessment & Plan Note (Signed)
Will need referral derm for f/u

## 2020-09-20 NOTE — Assessment & Plan Note (Addendum)
.   Lab Results  Component Value Date   LDLCALC 68 09/09/2020   Stable, pt to continue current diet, declines statin, but will have ct cardiac score and consider in future

## 2020-09-20 NOTE — Addendum Note (Signed)
Addended by: Biagio Borg on: 09/20/2020 09:07 PM   Modules accepted: Orders

## 2020-09-29 ENCOUNTER — Encounter: Payer: Self-pay | Admitting: Internal Medicine

## 2020-10-04 IMAGING — CR DG CERVICAL SPINE 2 OR 3 VIEWS
2 series · 2 of 2 positions shown · non-contrast
Comparison: 08/26/2019

CLINICAL DATA: C4 through C7 ACDF

EXAM:
CERVICAL SPINE - 2-3 VIEW

[AP (1 of 2)]
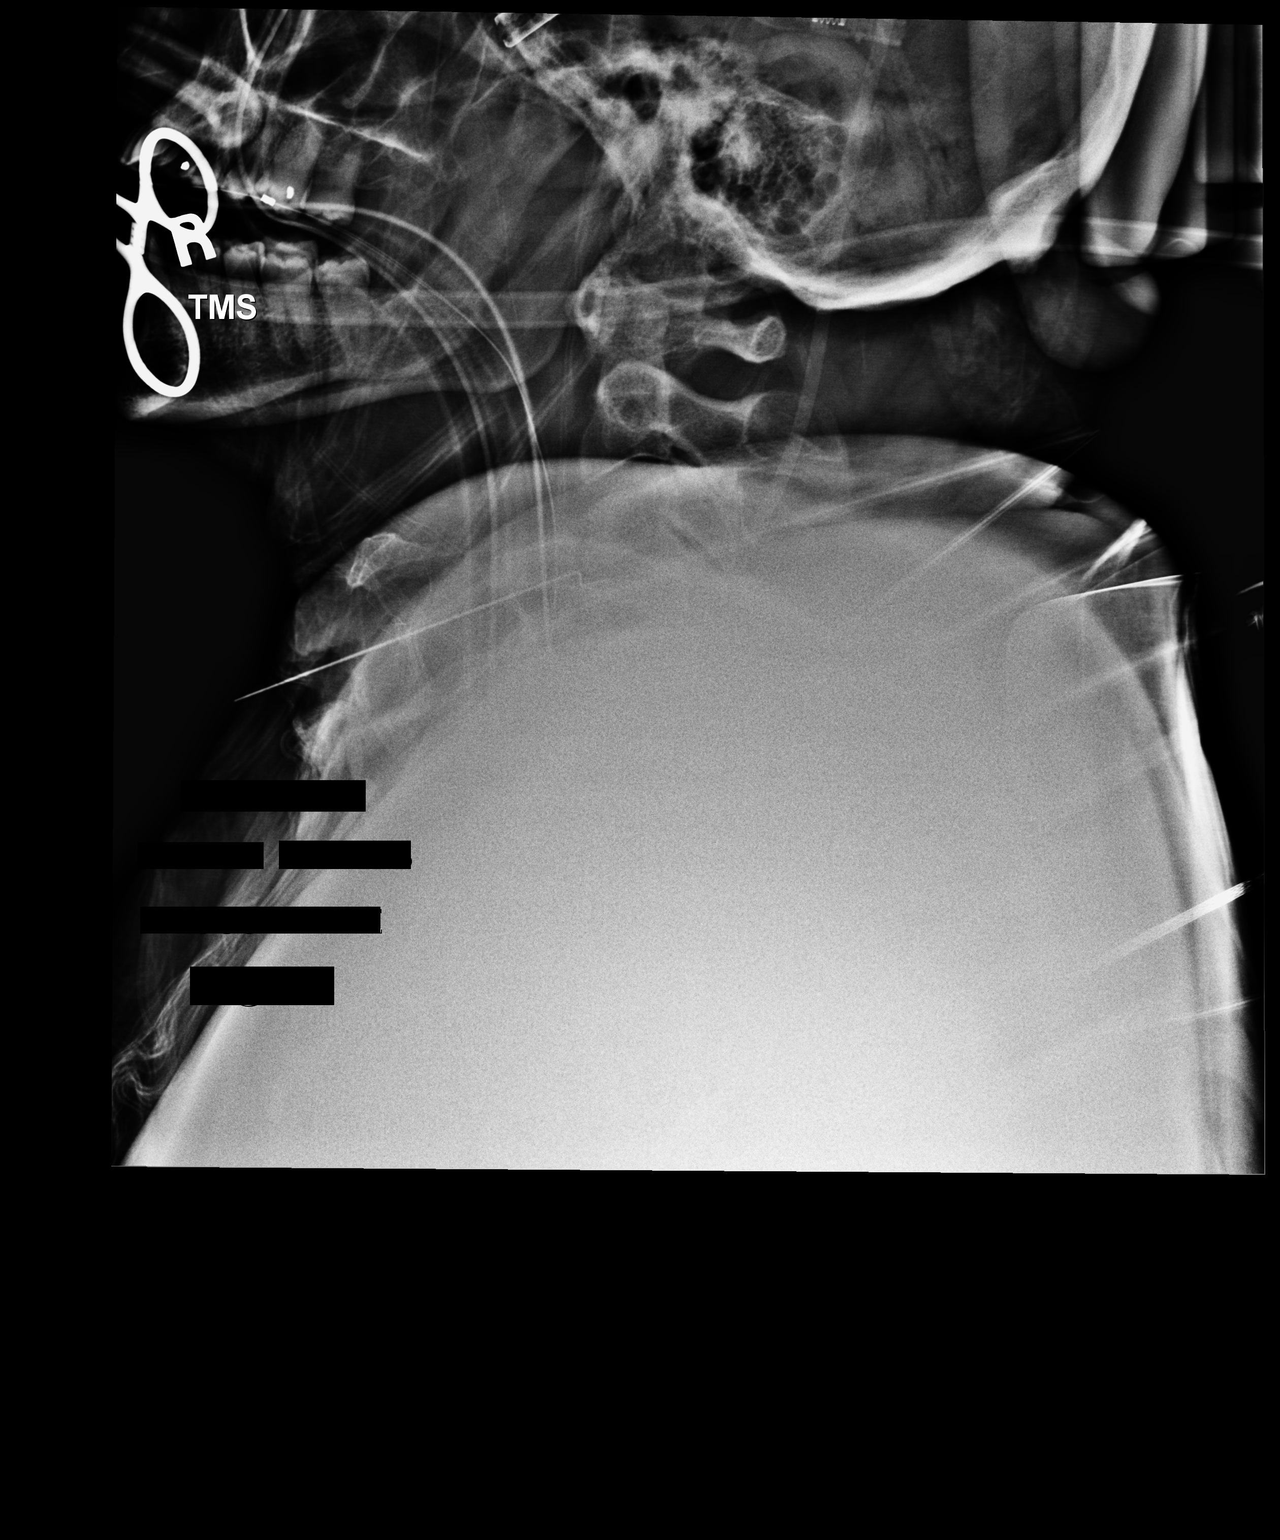

[AP (2 of 2)]
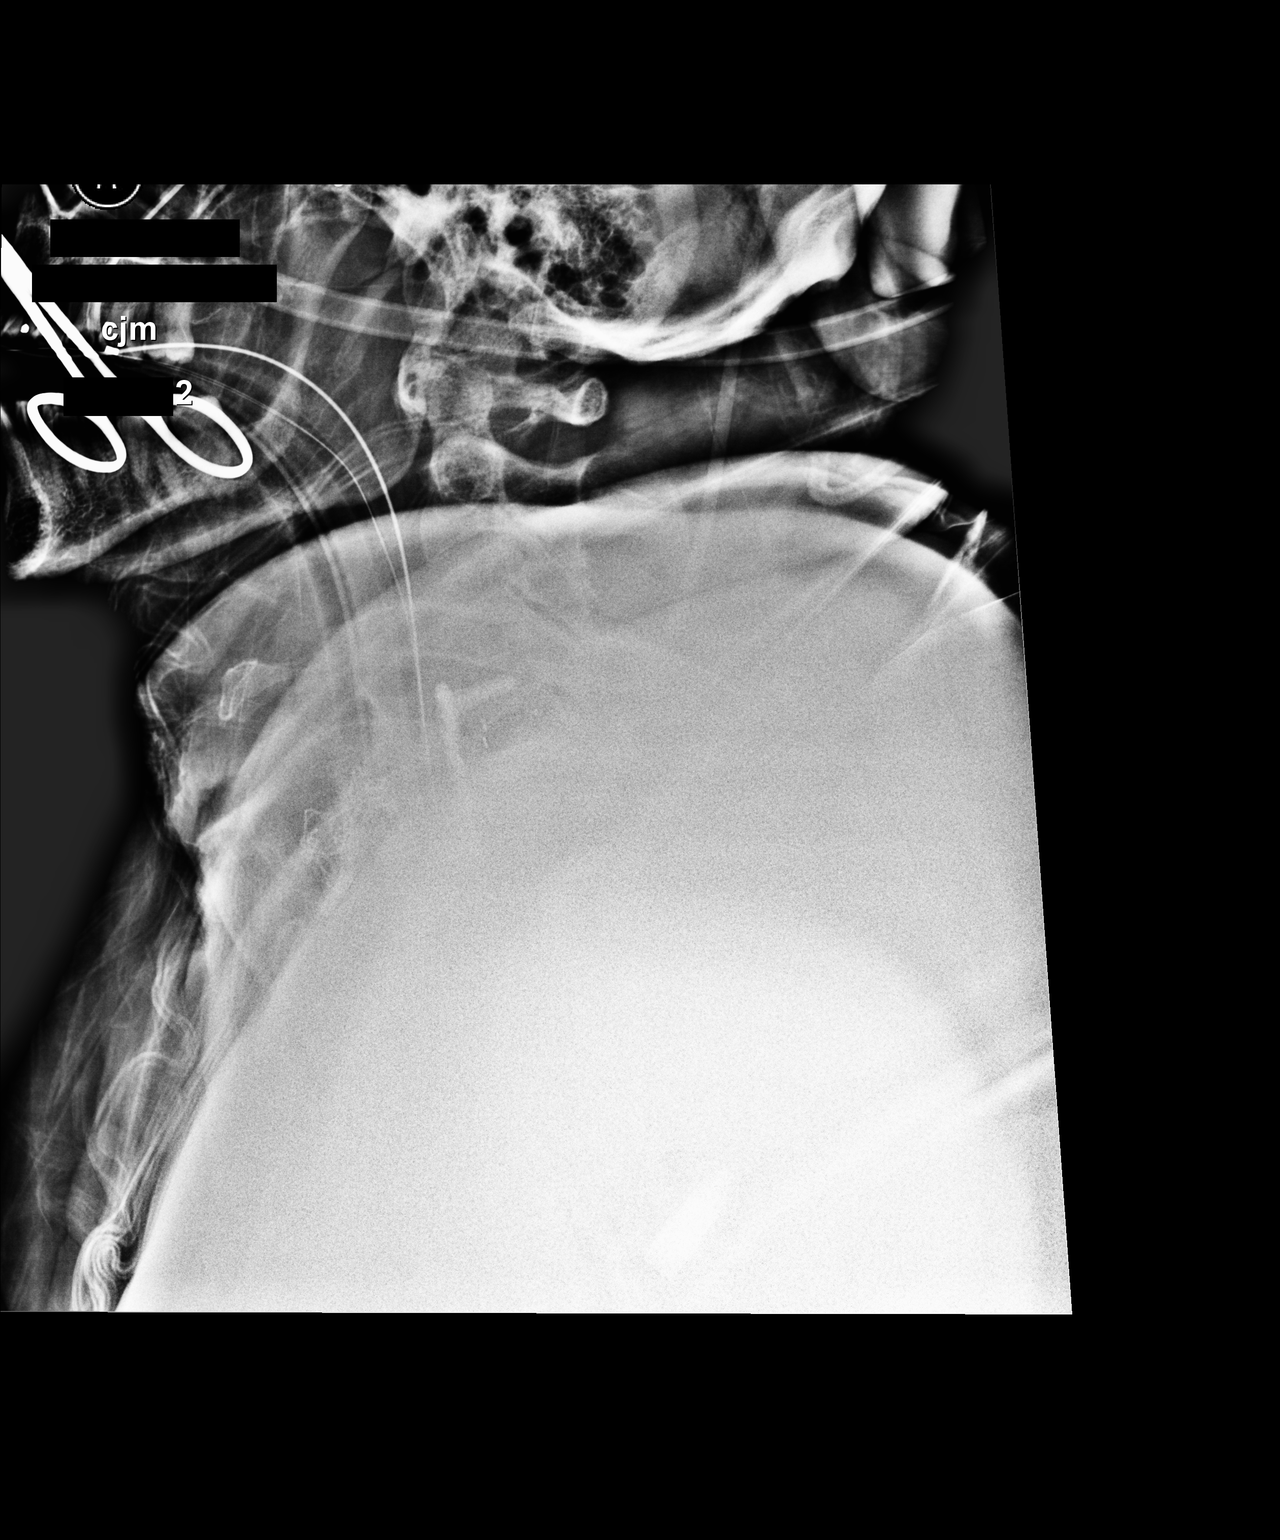

[2 of 2 positions shown; findings below may reference images not displayed]

FINDINGS: Two lateral views of the cervical spine are obtained. Evaluation is
limited due to portable cross-table positioning and technique.
Initial image demonstrates instrumentation at the C3/C4 level. Final
image demonstrates the upper extent of an anterior cervical fusion,
with fusion plate and intra corporal screws visualized at C4 and C5.
The inferior extent is not well visualized due to overlapping
structures. Patient is intubated with enteric catheter in place.
IMPRESSION: 1. Intraoperative exam as above.

## 2020-12-05 ENCOUNTER — Telehealth: Payer: Self-pay | Admitting: Internal Medicine

## 2020-12-07 NOTE — Telephone Encounter (Signed)
Tried calling patient, no answer. Left message for patient to call me back. 

## 2020-12-07 NOTE — Telephone Encounter (Signed)
BP Readings from Last 3 Encounters:  09/14/20 (!) 162/84  03/13/20 (!) 140/86  02/25/20 140/76    Ok to let pt know, benicar 20 not covered well by his insurance,  So we can try change to losartan 100 mg per day - I will send

## 2020-12-07 NOTE — Telephone Encounter (Signed)
Patient returned call. He said that he will call back tomorrow. He was headed into work and is unable to have his phone

## 2020-12-08 NOTE — Telephone Encounter (Signed)
Patient notified and verbalizes understanding.

## 2020-12-10 ENCOUNTER — Ambulatory Visit: Payer: 59 | Admitting: Physician Assistant

## 2020-12-10 ENCOUNTER — Other Ambulatory Visit: Payer: Self-pay

## 2020-12-10 DIAGNOSIS — D485 Neoplasm of uncertain behavior of skin: Secondary | ICD-10-CM | POA: Diagnosis not present

## 2020-12-10 DIAGNOSIS — D1800 Hemangioma unspecified site: Secondary | ICD-10-CM

## 2020-12-10 DIAGNOSIS — Z1283 Encounter for screening for malignant neoplasm of skin: Secondary | ICD-10-CM | POA: Diagnosis not present

## 2020-12-10 DIAGNOSIS — Z85828 Personal history of other malignant neoplasm of skin: Secondary | ICD-10-CM | POA: Diagnosis not present

## 2020-12-10 DIAGNOSIS — E119 Type 2 diabetes mellitus without complications: Secondary | ICD-10-CM

## 2020-12-10 DIAGNOSIS — L821 Other seborrheic keratosis: Secondary | ICD-10-CM | POA: Diagnosis not present

## 2020-12-10 DIAGNOSIS — L814 Other melanin hyperpigmentation: Secondary | ICD-10-CM

## 2020-12-10 DIAGNOSIS — L578 Other skin changes due to chronic exposure to nonionizing radiation: Secondary | ICD-10-CM

## 2020-12-10 NOTE — Patient Instructions (Signed)

## 2020-12-24 ENCOUNTER — Telehealth: Payer: Self-pay | Admitting: *Deleted

## 2020-12-24 ENCOUNTER — Encounter: Payer: Self-pay | Admitting: Physician Assistant

## 2020-12-24 NOTE — Telephone Encounter (Signed)
-----   Message from Warren Danes, [place] sent at 12/24/2020 10:14 AM EDT ----- Nancy Fetter freckle with few atypical cells.  RTC if recurs.

## 2020-12-24 NOTE — Telephone Encounter (Signed)
Pathology results to patient.

## 2020-12-24 NOTE — Progress Notes (Signed)
   Follow-Up Visit   Subjective  Jeffrey [name] is a 55 y.o. male who presents for the following: Annual Exam (Skin check concerns are left eye and left ear. Patient also has a lesion on his chest. No bleeding. Patient dose have history of Basal cell on right shoulder. ).   The following portions of the chart were reviewed this encounter and updated as appropriate:  Tobacco  Allergies  Meds  Problems  Med Hx  Surg Hx  Fam Hx      Objective  Well appearing patient in no apparent distress; mood and affect are within normal limits.  All skin waist up examined.  Objective  Left Temporal Scalp: Bichromic dark nested macule.       Assessment & Plan  Neoplasm of uncertain behavior of skin Left Temporal Scalp  Skin / nail biopsy Type of biopsy: tangential   Informed consent: discussed and consent obtained   Timeout: patient name, date of birth, surgical site, and procedure verified   Anesthesia: the lesion was anesthetized in a standard fashion   Anesthetic:  1% lidocaine w/ epinephrine 1-100,000 local infiltration Instrument used: flexible razor blade   Hemostasis achieved with: ferric subsulfate   Outcome: patient tolerated procedure well   Post-procedure details: wound care instructions given    Specimen 1 - Surgical pathology Differential Diagnosis: atypia   Check Margins: YES  Lentigines - Scattered tan macules - Discussed due to sun exposure - Benign, observe - Call for any changes  Seborrheic Keratoses - Stuck-on, waxy, tan-brown papules and plaques  - Discussed benign etiology and prognosis. - Observe - Call for any changes  Hemangiomas - Red papules - Discussed benign nature - Observe - Call for any changes  Actinic Damage - diffuse scaly erythematous macules with underlying dyspigmentation - Recommend daily broad spectrum sunscreen SPF 30+ to sun-exposed areas, reapply every 2 hours as needed.  - Call for new or changing lesions.  Skin  cancer screening performed today.   I, Rodert Hinch, PA-C, have reviewed all documentation's for this visit.  The documentation on 12/24/20 for the exam, diagnosis, procedures and orders are all accurate and complete.

## 2021-02-03 ENCOUNTER — Other Ambulatory Visit: Payer: Self-pay | Admitting: Internal Medicine

## 2021-02-03 NOTE — Telephone Encounter (Signed)
Alprazolam  Last Visit: 09/14/20 Next Visit: 8.1/22 Last Filled: 11/10/20  PMP done; please advise

## 2021-02-16 ENCOUNTER — Other Ambulatory Visit: Payer: Self-pay | Admitting: Internal Medicine

## 2021-02-16 NOTE — Telephone Encounter (Signed)
Please refill as per office routine med refill policy (all routine meds refilled for 3 mo or monthly per pt preference up to one year from last visit, then month to month grace period for 3 mo, then further med refills will have to be denied)  

## 2021-03-12 ENCOUNTER — Other Ambulatory Visit: Payer: Self-pay

## 2021-03-12 ENCOUNTER — Other Ambulatory Visit: Payer: Self-pay | Admitting: Internal Medicine

## 2021-03-15 ENCOUNTER — Other Ambulatory Visit: Payer: Self-pay

## 2021-03-15 ENCOUNTER — Ambulatory Visit: Payer: 59 | Admitting: Internal Medicine

## 2021-03-15 ENCOUNTER — Encounter: Payer: Self-pay | Admitting: Internal Medicine

## 2021-03-15 ENCOUNTER — Other Ambulatory Visit (INDEPENDENT_AMBULATORY_CARE_PROVIDER_SITE_OTHER): Payer: 59

## 2021-03-15 VITALS — BP 134/78 | HR 90 | Temp 98.3°F | Ht 71.0 in | Wt 252.0 lb

## 2021-03-15 DIAGNOSIS — Z23 Encounter for immunization: Secondary | ICD-10-CM

## 2021-03-15 DIAGNOSIS — E1165 Type 2 diabetes mellitus with hyperglycemia: Secondary | ICD-10-CM

## 2021-03-15 DIAGNOSIS — E7800 Pure hypercholesterolemia, unspecified: Secondary | ICD-10-CM | POA: Diagnosis not present

## 2021-03-15 DIAGNOSIS — I10 Essential (primary) hypertension: Secondary | ICD-10-CM

## 2021-03-15 DIAGNOSIS — Z0000 Encounter for general adult medical examination without abnormal findings: Secondary | ICD-10-CM

## 2021-03-15 DIAGNOSIS — E559 Vitamin D deficiency, unspecified: Secondary | ICD-10-CM | POA: Diagnosis not present

## 2021-03-15 DIAGNOSIS — E538 Deficiency of other specified B group vitamins: Secondary | ICD-10-CM

## 2021-03-15 LAB — HEMOGLOBIN A1C: Hgb A1c MFr Bld: 7.2 % — ABNORMAL HIGH (ref 4.6–6.5)

## 2021-03-15 LAB — HEPATIC FUNCTION PANEL
ALT: 17 U/L (ref 0–53)
AST: 15 U/L (ref 0–37)
Albumin: 4.4 g/dL (ref 3.5–5.2)
Alkaline Phosphatase: 58 U/L (ref 39–117)
Bilirubin, Direct: 0.2 mg/dL (ref 0.0–0.3)
Total Bilirubin: 0.6 mg/dL (ref 0.2–1.2)
Total Protein: 6.7 g/dL (ref 6.0–8.3)

## 2021-03-15 LAB — BASIC METABOLIC PANEL
BUN: 14 mg/dL (ref 6–23)
CO2: 25 mEq/L (ref 19–32)
Calcium: 9.4 mg/dL (ref 8.4–10.5)
Chloride: 103 mEq/L (ref 96–112)
Creatinine, Ser: 0.82 mg/dL (ref 0.40–1.50)
GFR: 97.79 mL/min (ref >60.00)
Glucose, Bld: 121 mg/dL — ABNORMAL HIGH (ref 70–99)
Potassium: 3.8 mEq/L (ref 3.5–5.1)
Sodium: 139 mEq/L (ref 135–145)

## 2021-03-15 LAB — LIPID PANEL
Cholesterol: 116 mg/dL (ref 0–200)
HDL: 31 mg/dL — ABNORMAL LOW (ref >39.00)
LDL Cholesterol: 64 mg/dL (ref 0–99)
NonHDL: 85.44
Total CHOL/HDL Ratio: 4
Triglycerides: 105 mg/dL (ref 0.0–149.0)
VLDL: 21 mg/dL (ref 0.0–40.0)

## 2021-03-15 LAB — VITAMIN D 25 HYDROXY (VIT D DEFICIENCY, FRACTURES): VITD: 35.84 ng/mL (ref 30.00–100.00)

## 2021-03-15 NOTE — Assessment & Plan Note (Signed)
Lab Results  Component Value Date   LDLCALC 64 03/15/2021   Stable, pt to continue current statin lipitor 80

## 2021-03-15 NOTE — Progress Notes (Signed)
Patient ID: Jeffrey [name], male   DOB: 12-03-1965, 55 y.o.   MRN: [id number]        Chief Complaint: follow up HTN, HLD and hyperglycemia and low vit d       HPI:  Jeffrey [name] is a 55 y.o. male here overall doing ok. Pt denies chest pain, increased sob or doe, wheezing, orthopnea, PND, increased LE swelling, palpitations, dizziness or syncope.  Pt denies polydipsia, polyuria, or low sugar symptoms such as weakness or confusion improved with po intake.  Pt states overall good compliance with meds, trying to follow lower cholesterol, diabetic diet, wt overall stable but little exercise however.   Pt states Last low sugar 2 wks ago, prior to that 1 yr prior; good compliance with meds.  Has Lost 20 lbs with better diet! Due for shingrix today  Still plans to have cardiac CT score done when finances allow Wt Readings from Last 3 Encounters:  03/15/21 252 lb (114.3 kg)  09/14/20 265 lb (120.2 kg)  03/13/20 (!) 271 lb (122.9 kg)   BP Readings from Last 3 Encounters:  03/15/21 134/78  09/14/20 (!) 162/84  03/13/20 (!) 140/86         Past Medical History:  Diagnosis Date   Allergy    ANXIETY 07/05/2007   Arthritis 08/2015   neck and back   Cancer ([place])    basal cell   CARPAL TUNNEL SYNDROME, BILATERAL 07/05/2007   Cervicalgia 03/18/2008   DEPRESSION 07/05/2007   DIABETES MELLITUS, TYPE II 07/05/2007   ERECTILE DYSFUNCTION 07/11/2007   GERD 07/11/2007   HYPERLIPIDEMIA 07/05/2007   HYPERTENSION 07/05/2007   LOW BACK PAIN 07/11/2007   Meralgia paresthetica 09/25/2008   PEPTIC ULCER DISEASE 07/05/2007   SINUSITIS- ACUTE-NOS 03/26/2009   SKIN LESION 03/18/2008   Past Surgical History:  Procedure Laterality Date   ANTERIOR CERVICAL DECOMP/DISCECTOMY FUSION N/A 01/16/2020   Procedure: Cervical 4-5 Cervical 5-6 Cervical 6-7 Anterior cervical decompression/discectomy/fusion;  Surgeon: Erline Levine, MD;  Location: [facility];  Service: Neurosurgery;  Laterality: N/A;  3C   BASAL CELL CARCINOMA  EXCISION Right 2011   shoulder area   CARPAL TUNNEL RELEASE Left 01/16/2020   Procedure: LEFT CARPAL TUNNEL RELEASE;  Surgeon: Erline Levine, MD;  Location: [facility];  Service: Neurosurgery;  Laterality: Left;  3C   CARPAL TUNNEL RELEASE Right 02/25/2020   Procedure: RIGHT CARPAL TUNNEL RELEASE;  Surgeon: Erline Levine, MD;  Location: [facility];  Service: Neurosurgery;  Laterality: Right;   FRACTURE SURGERY     left wrist surgury/fracture  1985   SHOULDER ARTHROSCOPY WITH SUBACROMIAL DECOMPRESSION Left 03/30/2017   Procedure: SHOULDER ARTHROSCOPY WITH DEBRIDEMENT ROTATOR CUFF,SUBACROMIAL DECOMPRESSION AND OPEN TENODESIS;  Surgeon: [name], MD;  Location: [facility];  Service: Orthopedics;  Laterality: Left;  SHOULDER ARTHROSCOPY WITH DEBRIDEMENT ROTATOR CUFF,SUBACROMIAL DECOMPRESSION AND POSSIBLE OPEN TENODESIS   WISDOM TOOTH EXTRACTION      reports that he has been smoking cigars. He uses smokeless tobacco. He reports current alcohol use. He reports that he does not use drugs. family history includes Cancer in his maternal grandfather; Diabetes in his father, mother, and another family member; Heart disease in his maternal grandmother; Hypertension in his father, mother, and another family member. Allergies  Allergen Reactions   Codeine Itching and Other (See Comments)   Current Outpatient Medications on File Prior to Visit  Medication Sig Dispense Refill   ALPRAZolam (XANAX) 1 MG tablet TAKE 1 TABLET BY MOUTH TWICE A DAY AS NEEDED 60 tablet  5   amLODipine (NORVASC) 5 MG tablet TAKE 1 TABLET BY MOUTH EVERY DAY 90 tablet 1   aspirin EC 81 MG tablet Take 81 mg by mouth every evening.     atorvastatin (LIPITOR) 80 MG tablet Take 1 tablet (80 mg total) by mouth daily. 90 tablet 1   carisoprodol (SOMA) 350 MG tablet TAKE 1 TABLET BY MOUTH 3 TIMES A DAY AS NEEDED AS DIRECTED 90 tablet 2   dapagliflozin propanediol (FARXIGA) 10 MG TABS tablet Take 1 tablet (10 mg total) by mouth daily before breakfast.  90 tablet 3   glipiZIDE (GLUCOTROL XL) 10 MG 24 hr tablet Take 1 tablet (10 mg total) by mouth daily with breakfast. 90 tablet 3   losartan (COZAAR) 100 MG tablet Take 1 tablet (100 mg total) by mouth daily. 90 tablet 3   metFORMIN (GLUCOPHAGE) 500 MG tablet TAKE 2 TABLETS BY MOUTH EVERY MORNING AND TAKE 2 TABLETS BY MOUTH EVERY EVENING 360 tablet 3   pioglitazone (ACTOS) 45 MG tablet TAKE 1 TABLET BY MOUTH EVERY DAY 90 tablet 3   triamcinolone (NASACORT) 55 MCG/ACT AERO nasal inhaler Place 2 sprays into the nose daily. 3 each 3   No current facility-administered medications on file prior to visit.        ROS:  All others reviewed and negative.  Objective        PE:  BP 134/78 (BP Location: Left Arm, Patient Position: Sitting, Cuff Size: Large)   Pulse 90   Temp 98.3 F (36.8 C) (Oral)   Ht '5\' 11"'$  (1.803 m)   Wt 252 lb (114.3 kg)   SpO2 97%   BMI 35.15 kg/m                 Constitutional: Pt appears in NAD               HENT: Head: NCAT.                Right Ear: External ear normal.                 Left Ear: External ear normal.                Eyes: . Pupils are equal, round, and reactive to light. Conjunctivae and EOM are normal               Nose: without d/c or deformity               Neck: Neck supple. Gross normal ROM               Cardiovascular: Normal rate and regular rhythm.                 Pulmonary/Chest: Effort normal and breath sounds without rales or wheezing.                Abd:  Soft, NT, ND, + BS, no organomegaly               Neurological: Pt is alert. At baseline orientation, motor grossly intact               Skin: Skin is warm. No rashes, no other new lesions, LE edema - none               Psychiatric: Pt behavior is normal without agitation   Micro: none  Cardiac tracings I have personally interpreted today:  none  Pertinent Radiological findings (summarize): none  Lab Results  Component Value Date   WBC 7.2 09/09/2020   HGB 17.7 (H) 09/09/2020    HCT 51.9 09/09/2020   PLT 247.0 09/09/2020   GLUCOSE 121 (H) 03/15/2021   CHOL 116 03/15/2021   TRIG 105.0 03/15/2021   HDL 31.00 (L) 03/15/2021   LDLDIRECT 90.0 08/18/2015   LDLCALC 64 03/15/2021   ALT 17 03/15/2021   AST 15 03/15/2021   NA 139 03/15/2021   K 3.8 03/15/2021   CL 103 03/15/2021   CREATININE 0.82 03/15/2021   BUN 14 03/15/2021   CO2 25 03/15/2021   TSH 0.82 09/09/2020   PSA 0.50 09/09/2020   HGBA1C 7.2 (H) 03/15/2021   MICROALBUR <0.7 09/09/2020   Assessment/Plan:  Jeffrey [name] is a 55 y.o. White or Caucasian [1] male with  has a past medical history of Allergy, ANXIETY (07/05/2007), Arthritis (08/2015), Cancer ([place]), CARPAL TUNNEL SYNDROME, BILATERAL (07/05/2007), Cervicalgia (03/18/2008), DEPRESSION (07/05/2007), DIABETES MELLITUS, TYPE II (07/05/2007), ERECTILE DYSFUNCTION (07/11/2007), GERD (07/11/2007), HYPERLIPIDEMIA (07/05/2007), HYPERTENSION (07/05/2007), LOW BACK PAIN (07/11/2007), Meralgia paresthetica (09/25/2008), PEPTIC ULCER DISEASE (07/05/2007), SINUSITIS- ACUTE-NOS (03/26/2009), and SKIN LESION (03/18/2008).  Vitamin D deficiency Last vitamin D Lab Results  Component Value Date   VD25OH 27.70 (L) 09/09/2020   Low, to start oral replacement at 4000 u qd    Hyperlipidemia Lab Results  Component Value Date   LDLCALC 64 03/15/2021   Stable, pt to continue current statin lipitor 80   Essential (primary) hypertension BP Readings from Last 3 Encounters:  03/15/21 134/78  09/14/20 (!) 162/84  03/13/20 (!) 140/86   Stable, pt to continue medical treatment norvasc, losartan, farxiga   Diabetes ([place]) Lab Results  Component Value Date   HGBA1C 7.2 (H) 03/15/2021   Mild uncontrolled, pt to continue current medical treatment farxiga, metformin, glipizide, actos for now, and f/u pending a1c drawn today, cont wt loss and diet control Followup: No follow-ups on file.  Cathlean Cower, MD 03/15/2021 1:05 PM [facility]

## 2021-03-15 NOTE — Assessment & Plan Note (Signed)
Lab Results  Component Value Date   HGBA1C 7.2 (H) 03/15/2021   Mild uncontrolled, pt to continue current medical treatment farxiga, metformin, glipizide, actos for now, and f/u pending a1c drawn today, cont wt loss and diet control

## 2021-03-15 NOTE — Assessment & Plan Note (Signed)
BP Readings from Last 3 Encounters:  03/15/21 134/78  09/14/20 (!) 162/84  03/13/20 (!) 140/86   Stable, pt to continue medical treatment norvasc, losartan, farxiga

## 2021-03-15 NOTE — Assessment & Plan Note (Signed)
Last vitamin D Lab Results  Component Value Date   VD25OH 27.70 (L) 09/09/2020   Low, to start oral replacement at 4000 u qd

## 2021-03-15 NOTE — Patient Instructions (Addendum)
Please continue to consider having the cardiac CT Score done - We have discussed the Cardiac CT Score test to measure the calcification level (if any) in your heart arteries.  This test has been ordered in our [place], so please call [facility] directly, as they prefer this, at [phone] to be scheduled.  You had the Shingles shot #1 today  The second shingles shot can be done at 2-6 months, so you can make a Nurse Appt in you like, or we can plan on the second shot at your next 6 mo appt  Please continue to monitor for further wt loss and low sugars, and call if you have more then 1 every 2 wks or so, as we can decrease the glipizide to make the treatment more safe  Please continue all other medications as before, and refills have been done if requested.  Please have the pharmacy call with any other refills you may need.  Please continue your efforts at being more active, low cholesterol diet, and weight control.  Please keep your appointments with your specialists as you may have planned  Your lab work today is still pending  You will be contacted by phone if any changes need to be made immediately.  Otherwise, you will receive a letter about your results with an explanation, but please check with MyChart first.  Please remember to sign up for MyChart if you have not done so, as this will be important to you in the future with finding out test results, communicating by private email, and scheduling acute appointments online when needed.  Please make an Appointment to return in 6 months, or sooner if needed, also with Lab Appointment for testing done 3-5 days before at the [facility] (so this is for TWO appointments - please see the scheduling desk as you leave)  Due to the ongoing Covid 19 pandemic, our lab now requires an appointment for any labs done at our office.  If you need labs done and do not have an appointment, please call our office ahead of time to schedule  before presenting to the lab for your testing.

## 2021-04-10 ENCOUNTER — Other Ambulatory Visit: Payer: Self-pay | Admitting: Internal Medicine

## 2021-04-10 NOTE — Telephone Encounter (Signed)
Please refill as per office routine med refill policy (all routine meds to be refilled for 3 mo or monthly (per pt preference) up to one year from last visit, then month to month grace period for 3 mo, then further med refills will have to be denied) ? ?

## 2021-07-16 ENCOUNTER — Other Ambulatory Visit: Payer: Self-pay | Admitting: Internal Medicine

## 2021-07-29 ENCOUNTER — Other Ambulatory Visit: Payer: Self-pay | Admitting: Internal Medicine

## 2021-07-29 NOTE — Telephone Encounter (Signed)
Please refill as per office routine med refill policy (all routine meds to be refilled for 3 mo or monthly (per pt preference) up to one year from last visit, then month to month grace period for 3 mo, then further med refills will have to be denied) ? ?

## 2021-08-25 ENCOUNTER — Other Ambulatory Visit: Payer: Self-pay | Admitting: Internal Medicine

## 2021-09-10 ENCOUNTER — Other Ambulatory Visit: Payer: Self-pay

## 2021-09-10 ENCOUNTER — Other Ambulatory Visit (INDEPENDENT_AMBULATORY_CARE_PROVIDER_SITE_OTHER): Payer: 59

## 2021-09-10 DIAGNOSIS — Z125 Encounter for screening for malignant neoplasm of prostate: Secondary | ICD-10-CM

## 2021-09-10 DIAGNOSIS — Z0000 Encounter for general adult medical examination without abnormal findings: Secondary | ICD-10-CM

## 2021-09-10 DIAGNOSIS — E1165 Type 2 diabetes mellitus with hyperglycemia: Secondary | ICD-10-CM | POA: Diagnosis not present

## 2021-09-10 DIAGNOSIS — E538 Deficiency of other specified B group vitamins: Secondary | ICD-10-CM | POA: Diagnosis not present

## 2021-09-10 DIAGNOSIS — E559 Vitamin D deficiency, unspecified: Secondary | ICD-10-CM | POA: Diagnosis not present

## 2021-09-10 LAB — CBC WITH DIFFERENTIAL/PLATELET
Basophils Absolute: 0.1 10*3/uL (ref 0.0–0.1)
Basophils Relative: 0.9 % (ref 0.0–3.0)
Eosinophils Absolute: 0.3 10*3/uL (ref 0.0–0.7)
Eosinophils Relative: 3 % (ref 0.0–5.0)
HCT: 53.5 % — ABNORMAL HIGH (ref 39.0–52.0)
Hemoglobin: 17.9 g/dL — ABNORMAL HIGH (ref 13.0–17.0)
Lymphocytes Relative: 14.8 % (ref 12.0–46.0)
Lymphs Abs: 1.4 10*3/uL (ref 0.7–4.0)
MCHC: 33.5 g/dL (ref 30.0–36.0)
MCV: 93.4 fl (ref 78.0–100.0)
Monocytes Absolute: 0.9 10*3/uL (ref 0.1–1.0)
Monocytes Relative: 9.5 % (ref 3.0–12.0)
Neutro Abs: 6.9 10*3/uL (ref 1.4–7.7)
Neutrophils Relative %: 71.8 % (ref 43.0–77.0)
Platelets: 262 10*3/uL (ref 150.0–400.0)
RBC: 5.73 Mil/uL (ref 4.22–5.81)
RDW: 13.8 % (ref 11.5–15.5)
WBC: 9.5 10*3/uL (ref 4.0–10.5)

## 2021-09-10 LAB — URINALYSIS, ROUTINE W REFLEX MICROSCOPIC
Bilirubin Urine: NEGATIVE
Hgb urine dipstick: NEGATIVE
Ketones, ur: NEGATIVE
Leukocytes,Ua: NEGATIVE
Nitrite: NEGATIVE
Specific Gravity, Urine: 1.015 (ref 1.000–1.030)
Total Protein, Urine: NEGATIVE
Urine Glucose: >=1000 — AB
Urobilinogen, UA: 0.2 (ref 0.0–1.0)
pH: 5.5 (ref 5.0–8.0)

## 2021-09-10 LAB — LDL CHOLESTEROL, DIRECT: Direct LDL: 75 mg/dL

## 2021-09-10 LAB — LIPID PANEL
Cholesterol: 123 mg/dL (ref 0–200)
HDL: 30.9 mg/dL — ABNORMAL LOW (ref >39.00)
NonHDL: 92.17
Total CHOL/HDL Ratio: 4
Triglycerides: 208 mg/dL — ABNORMAL HIGH (ref 0.0–149.0)
VLDL: 41.6 mg/dL — ABNORMAL HIGH (ref 0.0–40.0)

## 2021-09-10 LAB — HEMOGLOBIN A1C: Hgb A1c MFr Bld: 7.4 % — ABNORMAL HIGH (ref 4.6–6.5)

## 2021-09-10 LAB — BASIC METABOLIC PANEL
BUN: 18 mg/dL (ref 6–23)
CO2: 24 mEq/L (ref 19–32)
Calcium: 9.2 mg/dL (ref 8.4–10.5)
Chloride: 106 mEq/L (ref 96–112)
Creatinine, Ser: 0.82 mg/dL (ref 0.40–1.50)
GFR: 97.45 mL/min (ref >60.00)
Glucose, Bld: 172 mg/dL — ABNORMAL HIGH (ref 70–99)
Potassium: 4.3 mEq/L (ref 3.5–5.1)
Sodium: 141 mEq/L (ref 135–145)

## 2021-09-10 LAB — PSA: PSA: 0.55 ng/mL (ref 0.10–4.00)

## 2021-09-10 LAB — HEPATIC FUNCTION PANEL
ALT: 15 U/L (ref 0–53)
AST: 13 U/L (ref 0–37)
Albumin: 4.4 g/dL (ref 3.5–5.2)
Alkaline Phosphatase: 68 U/L (ref 39–117)
Bilirubin, Direct: 0.1 mg/dL (ref 0.0–0.3)
Total Bilirubin: 0.3 mg/dL (ref 0.2–1.2)
Total Protein: 6.3 g/dL (ref 6.0–8.3)

## 2021-09-10 LAB — VITAMIN B12: Vitamin B-12: 501 pg/mL (ref 211–911)

## 2021-09-10 LAB — MICROALBUMIN / CREATININE URINE RATIO
Creatinine,U: 70.1 mg/dL
Microalb Creat Ratio: 1 mg/g (ref 0.0–30.0)
Microalb, Ur: <0.7 mg/dL (ref 0.0–1.9)

## 2021-09-10 LAB — TSH: TSH: 1.38 u[IU]/mL (ref 0.35–5.50)

## 2021-09-10 LAB — VITAMIN D 25 HYDROXY (VIT D DEFICIENCY, FRACTURES): VITD: 13.88 ng/mL — ABNORMAL LOW (ref 30.00–100.00)

## 2021-09-13 ENCOUNTER — Encounter: Payer: Self-pay | Admitting: Internal Medicine

## 2021-09-13 ENCOUNTER — Other Ambulatory Visit: Payer: Self-pay

## 2021-09-13 ENCOUNTER — Ambulatory Visit (INDEPENDENT_AMBULATORY_CARE_PROVIDER_SITE_OTHER): Payer: 59

## 2021-09-13 ENCOUNTER — Ambulatory Visit: Payer: 59 | Admitting: Internal Medicine

## 2021-09-13 VITALS — BP 128/70 | HR 94 | Temp 98.5°F | Ht 71.0 in | Wt 250.0 lb

## 2021-09-13 DIAGNOSIS — G2581 Restless legs syndrome: Secondary | ICD-10-CM

## 2021-09-13 DIAGNOSIS — I10 Essential (primary) hypertension: Secondary | ICD-10-CM

## 2021-09-13 DIAGNOSIS — Z0001 Encounter for general adult medical examination with abnormal findings: Secondary | ICD-10-CM

## 2021-09-13 DIAGNOSIS — M542 Cervicalgia: Secondary | ICD-10-CM | POA: Diagnosis not present

## 2021-09-13 DIAGNOSIS — Z23 Encounter for immunization: Secondary | ICD-10-CM | POA: Diagnosis not present

## 2021-09-13 DIAGNOSIS — E559 Vitamin D deficiency, unspecified: Secondary | ICD-10-CM | POA: Diagnosis not present

## 2021-09-13 DIAGNOSIS — E1165 Type 2 diabetes mellitus with hyperglycemia: Secondary | ICD-10-CM | POA: Diagnosis not present

## 2021-09-13 DIAGNOSIS — E7800 Pure hypercholesterolemia, unspecified: Secondary | ICD-10-CM

## 2021-09-13 DIAGNOSIS — F17200 Nicotine dependence, unspecified, uncomplicated: Secondary | ICD-10-CM

## 2021-09-13 MED ORDER — GABAPENTIN 300 MG PO CAPS: 300.0000 mg | ORAL_CAPSULE | Freq: Every day | ORAL | 1 refills | Status: DC

## 2021-09-13 MED ORDER — RYBELSUS 3 MG PO TABS: 3.0000 mg | ORAL_TABLET | Freq: Every day | ORAL | 3 refills | Status: DC

## 2021-09-13 MED ORDER — CHOLECALCIFEROL 50 MCG (2000 UT) PO TABS: ORAL_TABLET | ORAL | 99 refills | Status: AC

## 2021-09-13 NOTE — Progress Notes (Signed)
Patient ID: Jeffrey [name], male   DOB: Dec 28, 1965, 56 y.o.   MRN: [id number]         Chief Complaint:: wellness exam and low vit d, hld, tobacco abuse, dm, right post neck pain, RLS       HPI:  Jeffrey [name] is a 56 y.o. male here for wellness exam; declines covid booster, o/w up to date                        Also Pt denies chest pain, increased sob or doe, wheezing, orthopnea, PND, increased LE swelling, palpitations, dizziness or syncope.   Pt denies polydipsia, polyuria, or new focal neuro s/s.   Pt denies fever, wt loss, night sweats, loss of appetite, or other constitutional symptoms  Not taking Vit D.  Pt still smoking, pt not ready to quit. Trying to work on low chol diet, pt is interested in Card Ct score.  Also has worsening right post neck pain for 1 wk with a pop to start, concerned he may have somehow gotten worse post ACDF, asks for f/u with Dr Marcelino Freestone.  Also with worsening RLS symptoms in the past 3 mo, asking for further tx. Lost 15 lbs in one yr, with better lower calorie diet Wt Readings from Last 3 Encounters:  09/13/21 250 lb (113.4 kg)  03/15/21 252 lb (114.3 kg)  09/14/20 265 lb (120.2 kg)   BP Readings from Last 3 Encounters:  09/13/21 128/70  03/15/21 134/78  09/14/20 (!) 162/84   Immunization History  Administered Date(s) Administered   H1N1 09/25/2008   Influenza Inj Mdck Quad Pf 05/25/2017, 05/12/2018, 04/25/2019   Influenza Split 07/04/2011   Influenza Whole 09/24/2009   Influenza,inj,Quad PF,6+ Mos 05/24/2013, 08/18/2015, 05/07/2020   Influenza-Unspecified 06/15/2016, 05/25/2021   PFIZER Comirnaty(Gray Top)Covid-19 Tri-Sucrose Vaccine 12/18/2020   PFIZER(Purple Top)SARS-COV-2 Vaccination 11/08/2019, 12/03/2019, 07/02/2020   Pneumococcal Conjugate-13 08/31/2015   Pneumococcal Polysaccharide-23 08/23/2016   Td 09/25/2008   Tdap 03/01/2019   Zoster Recombinat (Shingrix) 03/15/2021, 09/13/2021   There are no preventive care reminders to display for  this patient.     Past Medical History:  Diagnosis Date   Allergy    ANXIETY 07/05/2007   Arthritis 08/2015   neck and back   Cancer ([place])    basal cell   CARPAL TUNNEL SYNDROME, BILATERAL 07/05/2007   Cervicalgia 03/18/2008   DEPRESSION 07/05/2007   DIABETES MELLITUS, TYPE II 07/05/2007   ERECTILE DYSFUNCTION 07/11/2007   GERD 07/11/2007   HYPERLIPIDEMIA 07/05/2007   HYPERTENSION 07/05/2007   LOW BACK PAIN 07/11/2007   Meralgia paresthetica 09/25/2008   PEPTIC ULCER DISEASE 07/05/2007   SINUSITIS- ACUTE-NOS 03/26/2009   SKIN LESION 03/18/2008   Past Surgical History:  Procedure Laterality Date   ANTERIOR CERVICAL DECOMP/DISCECTOMY FUSION N/A 01/16/2020   Procedure: Cervical 4-5 Cervical 5-6 Cervical 6-7 Anterior cervical decompression/discectomy/fusion;  Surgeon: Erline Levine, MD;  Location: [facility];  Service: Neurosurgery;  Laterality: N/A;  3C   BASAL CELL CARCINOMA EXCISION Right 2011   shoulder area   CARPAL TUNNEL RELEASE Left 01/16/2020   Procedure: LEFT CARPAL TUNNEL RELEASE;  Surgeon: Erline Levine, MD;  Location: [facility];  Service: Neurosurgery;  Laterality: Left;  3C   CARPAL TUNNEL RELEASE Right 02/25/2020   Procedure: RIGHT CARPAL TUNNEL RELEASE;  Surgeon: Erline Levine, MD;  Location: [facility];  Service: Neurosurgery;  Laterality: Right;   FRACTURE SURGERY     left wrist surgury/fracture  1985   SHOULDER  ARTHROSCOPY WITH SUBACROMIAL DECOMPRESSION Left 03/30/2017   Procedure: SHOULDER ARTHROSCOPY WITH DEBRIDEMENT ROTATOR CUFF,SUBACROMIAL DECOMPRESSION AND OPEN TENODESIS;  Surgeon: [name], MD;  Location: [facility];  Service: Orthopedics;  Laterality: Left;  SHOULDER ARTHROSCOPY WITH DEBRIDEMENT ROTATOR CUFF,SUBACROMIAL DECOMPRESSION AND POSSIBLE OPEN TENODESIS   WISDOM TOOTH EXTRACTION      reports that he has been smoking cigars. He uses smokeless tobacco. He reports current alcohol use. He reports that he does not use drugs. family history includes Cancer in his maternal  grandfather; Diabetes in his father, mother, and another family member; Heart disease in his maternal grandmother; Hypertension in his father, mother, and another family member. Allergies  Allergen Reactions   Codeine Itching and Other (See Comments)   Current Outpatient Medications on File Prior to Visit  Medication Sig Dispense Refill   ALPRAZolam (XANAX) 1 MG tablet TAKE 1 TABLET BY MOUTH TWICE A DAY AS NEEDED 60 tablet 2   amLODipine (NORVASC) 5 MG tablet TAKE 1 TABLET BY MOUTH EVERY DAY 90 tablet 0   aspirin EC 81 MG tablet Take 81 mg by mouth every evening.     atorvastatin (LIPITOR) 80 MG tablet TAKE 1 TABLET BY MOUTH EVERY DAY 90 tablet 0   carisoprodol (SOMA) 350 MG tablet TAKE 1 TABLET BY MOUTH 3 TIMES A DAY AS NEEDED AS DIRECTED 90 tablet 2   dapagliflozin propanediol (FARXIGA) 10 MG TABS tablet Take 1 tablet (10 mg total) by mouth daily before breakfast. 90 tablet 3   glipiZIDE (GLUCOTROL XL) 10 MG 24 hr tablet Take 1 tablet (10 mg total) by mouth daily with breakfast. 90 tablet 3   losartan (COZAAR) 100 MG tablet Take 1 tablet (100 mg total) by mouth daily. 90 tablet 3   metFORMIN (GLUCOPHAGE) 500 MG tablet TAKE 2 TABLETS BY MOUTH EVERY MORNING AND TAKE 2 TABLETS BY MOUTH EVERY EVENING 360 tablet 3   pioglitazone (ACTOS) 45 MG tablet TAKE 1 TABLET BY MOUTH EVERY DAY 90 tablet 3   triamcinolone (NASACORT) 55 MCG/ACT AERO nasal inhaler Place 2 sprays into the nose daily. 3 each 3   No current facility-administered medications on file prior to visit.        ROS:  All others reviewed and negative.  Objective        PE:  BP 128/70 (BP Location: Left Arm, Patient Position: Sitting, Cuff Size: Large)    Pulse 94    Temp 98.5 F (36.9 C) (Oral)    Ht 5\' 11"  (1.803 m)    Wt 250 lb (113.4 kg)    SpO2 94%    BMI 34.87 kg/m                 Constitutional: Pt appears in NAD               HENT: Head: NCAT.                Right Ear: External ear normal.                 Left Ear:  External ear normal.                Eyes: . Pupils are equal, round, and reactive to light. Conjunctivae and EOM are normal               Nose: without d/c or deformity               Neck: Neck supple. Gross normal ROM  Cardiovascular: Normal rate and regular rhythm.                 Pulmonary/Chest: Effort normal and breath sounds without rales or wheezing.                Abd:  Soft, NT, ND, + BS, no organomegaly               Neurological: Pt is alert. At baseline orientation, motor grossly intact               Skin: Skin is warm. No rashes, no other new lesions, LE edema - none               Psychiatric: Pt behavior is normal without agitation   Micro: none  Cardiac tracings I have personally interpreted today:  none  Pertinent Radiological findings (summarize): none   Lab Results  Component Value Date   WBC 9.5 09/10/2021   HGB 17.9 (H) 09/10/2021   HCT 53.5 (H) 09/10/2021   PLT 262.0 09/10/2021   GLUCOSE 172 (H) 09/10/2021   CHOL 123 09/10/2021   TRIG 208.0 (H) 09/10/2021   HDL 30.90 (L) 09/10/2021   LDLDIRECT 75.0 09/10/2021   LDLCALC 64 03/15/2021   ALT 15 09/10/2021   AST 13 09/10/2021   NA 141 09/10/2021   K 4.3 09/10/2021   CL 106 09/10/2021   CREATININE 0.82 09/10/2021   BUN 18 09/10/2021   CO2 24 09/10/2021   TSH 1.38 09/10/2021   PSA 0.55 09/10/2021   HGBA1C 7.4 (H) 09/10/2021   MICROALBUR <0.7 09/10/2021   Assessment/Plan:  Jeffrey [name] is a 56 y.o. White or Caucasian [1] male with  has a past medical history of Allergy, ANXIETY (07/05/2007), Arthritis (08/2015), Cancer ([place]), CARPAL TUNNEL SYNDROME, BILATERAL (07/05/2007), Cervicalgia (03/18/2008), DEPRESSION (07/05/2007), DIABETES MELLITUS, TYPE II (07/05/2007), ERECTILE DYSFUNCTION (07/11/2007), GERD (07/11/2007), HYPERLIPIDEMIA (07/05/2007), HYPERTENSION (07/05/2007), LOW BACK PAIN (07/11/2007), Meralgia paresthetica (09/25/2008), PEPTIC ULCER DISEASE (07/05/2007), SINUSITIS- ACUTE-NOS  (03/26/2009), and SKIN LESION (03/18/2008).  Encounter for well adult exam with abnormal findings Age and sex appropriate education and counseling updated with regular exercise and diet Referrals for preventative services - none needed Immunizations addressed - declines covid booster Smoking counseling  - counseled to quit, pt not ready Evidence for depression or other mood disorder - none significant Most recent labs reviewed. I have personally reviewed and have noted: 1) the patient's medical and social history 2) The patient's current medications and supplements 3) The patient's height, weight, and BMI have been recorded in the chart   Diabetes [facility]) Lab Results  Component Value Date   HGBA1C 7.4 (H) 09/10/2021   Mild uncontrolled,, pt to continue current medical treatment and add rybelsus   Essential (primary) hypertension BP Readings from Last 3 Encounters:  09/13/21 128/70  03/15/21 134/78  09/14/20 (!) 162/84   Stable, pt to continue medical treatment norvasc, losartan    Hyperlipidemia Lab Results  Component Value Date   [place] 64 03/15/2021   Stable, pt to continue current statin lipitor 80, for card CT score  Vitamin D deficiency Last vitamin D Lab Results  Component Value Date   VD25OH 13.88 (L) 09/10/2021   Low, to start oral replacement   Smoker Pt counsled to quit, pt not ready  RLS (restless legs syndrome) Recent worsening, for add gabpenin 300 qhs  Neck pain on right side Suspect msk strain, for xray, but also f/u NS as per pt request  Followup: No follow-ups on  file.  Cathlean Cower, MD 09/19/2021 8:56 PM [facility]

## 2021-09-13 NOTE — Patient Instructions (Signed)
We have discussed the Cardiac CT Score test to measure the calcification level (if any) in your heart arteries.  This test has been ordered in our [place], so please call [facility] directly, as they prefer this, at [phone] to be scheduled.  Please take OTC Vitamin D3 at 2000 units per day, indefinitely  Please stop smoking  Please take all new medication as prescribed - the low dose rybelsus, as well as the gabapentin 300 mg at bedtime  Please continue all other medications as before, and refills have been done if requested.  Please have the pharmacy call with any other refills you may need.  Please continue your efforts at being more active, low cholesterol diet, and weight control.  You are otherwise up to date with prevention measures today.  Please keep your appointments with your specialists as you may have planned  You will be contacted regarding the referral for: Dr Vertell Limber  Please go to the XRAY Department in the first floor for the x-ray testing  You will be contacted by phone if any changes need to be made immediately.  Otherwise, you will receive a letter about your results with an explanation, but please check with MyChart first.  Please remember to sign up for MyChart if you have not done so, as this will be important to you in the future with finding out test results, communicating by private email, and scheduling acute appointments online when needed.  Please make an Appointment to return in 6 months, or sooner if needed, also with Lab Appointment for testing done 3-5 days before at the [facility] (so this is for TWO appointments - please see the scheduling desk as you leave)  Due to the ongoing Covid 19 pandemic, our lab now requires an appointment for any labs done at our office.  If you need labs done and do not have an appointment, please call our office ahead of time to schedule before presenting to the lab for your testing.

## 2021-09-18 ENCOUNTER — Other Ambulatory Visit: Payer: Self-pay | Admitting: Internal Medicine

## 2021-09-18 NOTE — Telephone Encounter (Signed)
Please refill as per office routine med refill policy (all routine meds to be refilled for 3 mo or monthly (per pt preference) up to one year from last visit, then month to month grace period for 3 mo, then further med refills will have to be denied) ? ?

## 2021-09-19 DIAGNOSIS — F17200 Nicotine dependence, unspecified, uncomplicated: Secondary | ICD-10-CM | POA: Insufficient documentation

## 2021-09-19 DIAGNOSIS — G2581 Restless legs syndrome: Secondary | ICD-10-CM | POA: Insufficient documentation

## 2021-09-19 DIAGNOSIS — M542 Cervicalgia: Secondary | ICD-10-CM | POA: Insufficient documentation

## 2021-09-19 NOTE — Assessment & Plan Note (Signed)
Last vitamin D Lab Results  Component Value Date   VD25OH 13.88 (L) 09/10/2021   Low, to start oral replacement

## 2021-09-19 NOTE — Assessment & Plan Note (Signed)
Recent worsening, for add gabpenin 300 qhs

## 2021-09-19 NOTE — Assessment & Plan Note (Signed)
Lab Results  Component Value Date   HGBA1C 7.4 (H) 09/10/2021   Mild uncontrolled,, pt to continue current medical treatment and add rybelsus

## 2021-09-19 NOTE — Assessment & Plan Note (Signed)
Age and sex appropriate education and counseling updated with regular exercise and diet Referrals for preventative services - none needed Immunizations addressed - declines covid booster Smoking counseling  - counseled to quit, pt not ready Evidence for depression or other mood disorder - none significant Most recent labs reviewed. I have personally reviewed and have noted: 1) the patient's medical and social history 2) The patient's current medications and supplements 3) The patient's height, weight, and BMI have been recorded in the chart

## 2021-09-19 NOTE — Assessment & Plan Note (Signed)
Suspect msk strain, for xray, but also f/u NS as per pt request

## 2021-09-19 NOTE — Assessment & Plan Note (Signed)
Pt counsled to quit, pt not ready °

## 2021-09-19 NOTE — Assessment & Plan Note (Signed)
BP Readings from Last 3 Encounters:  09/13/21 128/70  03/15/21 134/78  09/14/20 (!) 162/84   Stable, pt to continue medical treatment norvasc, losartan

## 2021-09-19 NOTE — Assessment & Plan Note (Addendum)
Lab Results  Component Value Date   LDLCALC 64 03/15/2021   Stable, pt to continue current statin lipitor 80, for card CT score

## 2021-10-25 ENCOUNTER — Other Ambulatory Visit: Payer: Self-pay | Admitting: Internal Medicine

## 2021-10-25 ENCOUNTER — Other Ambulatory Visit: Payer: Self-pay

## 2021-10-25 ENCOUNTER — Ambulatory Visit (INDEPENDENT_AMBULATORY_CARE_PROVIDER_SITE_OTHER)
Admission: RE | Admit: 2021-10-25 | Discharge: 2021-10-25 | Disposition: A | Discharge status: Home / Self Care | Payer: Self-pay | Source: Ambulatory Visit | Attending: Internal Medicine | Admitting: Internal Medicine

## 2021-10-25 DIAGNOSIS — E785 Hyperlipidemia, unspecified: Secondary | ICD-10-CM

## 2021-10-25 DIAGNOSIS — R931 Abnormal findings on diagnostic imaging of heart and coronary circulation: Secondary | ICD-10-CM

## 2021-10-26 ENCOUNTER — Other Ambulatory Visit: Payer: Self-pay | Admitting: Internal Medicine

## 2021-10-26 NOTE — Telephone Encounter (Signed)
Please refill as per office routine med refill policy (all routine meds to be refilled for 3 mo or monthly (per pt preference) up to one year from last visit, then month to month grace period for 3 mo, then further med refills will have to be denied) ? ?

## 2021-10-27 ENCOUNTER — Other Ambulatory Visit: Payer: Self-pay

## 2021-10-27 ENCOUNTER — Encounter: Payer: Self-pay | Admitting: Internal Medicine

## 2021-10-27 ENCOUNTER — Ambulatory Visit: Payer: 59 | Admitting: Internal Medicine

## 2021-10-27 VITALS — BP 157/88 | HR 86 | Ht 71.0 in | Wt 254.0 lb

## 2021-10-27 DIAGNOSIS — I700 Atherosclerosis of aorta: Secondary | ICD-10-CM | POA: Insufficient documentation

## 2021-10-27 DIAGNOSIS — E1159 Type 2 diabetes mellitus with other circulatory complications: Secondary | ICD-10-CM

## 2021-10-27 DIAGNOSIS — R011 Cardiac murmur, unspecified: Secondary | ICD-10-CM | POA: Diagnosis not present

## 2021-10-27 DIAGNOSIS — I152 Hypertension secondary to endocrine disorders: Secondary | ICD-10-CM

## 2021-10-27 MED ORDER — EZETIMIBE 10 MG PO TABS: 10.0000 mg | ORAL_TABLET | Freq: Every day | ORAL | 3 refills | Status: DC

## 2021-10-27 NOTE — Patient Instructions (Signed)
Medication Instructions:  ?Your physician has recommended you make the following change in your medication:  ?START: ezetimibe (Zetia) 10 mg by mouth once daily ? ?*If you need a refill on your cardiac medications before your next appointment, please call your pharmacy* ? ? ?Lab Work: ?IN 3 MONTHS: FLP, ALT (nothing to eat or drink 8-12 hours prior except water) ?If you have labs (blood work) drawn today and your tests are completely normal, you will receive your results only by: ?MyChart Message (if you have MyChart) OR ?A paper copy in the mail ?If you have any lab test that is abnormal or we need to change your treatment, we will call you to review the results. ? ? ?Testing/Procedures: ?Your physician has requested that you have an echocardiogram. Echocardiography is a painless test that uses sound waves to create images of your heart. It provides your doctor with information about the size and shape of your heart and how well your heart?s chambers and valves are working. This procedure takes approximately one hour. There are no restrictions for this procedure. ? ? ? ?Follow-Up: ?At [facility], you and your health needs are our priority.  As part of our continuing mission to provide you with exceptional heart care, we have created designated Provider Care Teams.  These Care Teams include your primary Cardiologist (physician) and Advanced Practice Providers (APPs -  Physician Assistants and Nurse Practitioners) who all work together to provide you with the care you need, when you need it. ? ?We recommend signing up for the patient portal called "MyChart".  Sign up information is provided on this After Visit Summary.  MyChart is used to connect with patients for Virtual Visits (Telemedicine).  Patients are able to view lab/test results, encounter notes, upcoming appointments, etc.  Non-urgent messages can be sent to your provider as well.   ?To learn more about what you can do with MyChart, go to  [email or web address].   ? ?Your next appointment:   ?7 month(s) ? ?The format for your next appointment:   ?In Person ? ?Provider:   ?Werner Lean, MD   ? ? ?

## 2021-10-27 NOTE — Progress Notes (Signed)
?Cardiology Office Note:   ? ?Date:  10/27/2021  ? ?ID:  Jeffrey [name], DOB 1966/03/27, MRN [id number] ? ?PCP:  Biagio Borg, MD ?  ?[facility] Providers ?Cardiologist:  Werner Lean, MD    ? ?Referring MD: Biagio Borg, MD  ? ?CC: CT discussions ?Consulted for the evaluation of CAC at the behest of Biagio Borg, MD ? ?History of Present Illness:   ? ?Jeffrey [name] is a 56 y.o. male with a hx of HTN, HLD elevated CAC, Aortic Valve calcification  and DM, active smoking who presents for evaluation. ? ?Patient notes that he is feeling good.   ? ?Works for [employer] and [place] without issues. ? ?Has had no chest pain, chest pressure, chest tightness, chest stinging. Patient exertion notable for running after his four grandchildren and feels no symptoms.  No shortness of breath, DOE .  No PND or orthopnea.  No weight gain, leg swelling , or abdominal swelling.  No syncope or near syncope . Notes  no palpitations or funny heart beats.    ? ?Ambulatory BP not done. ? ?Past Medical History:  ?Diagnosis Date  ? Allergy   ? ANXIETY 07/05/2007  ? Arthritis 08/2015  ? neck and back  ? Cancer [facility])   ? basal cell  ? CARPAL TUNNEL SYNDROME, BILATERAL 07/05/2007  ? Cervicalgia 03/18/2008  ? DEPRESSION 07/05/2007  ? DIABETES MELLITUS, TYPE II 07/05/2007  ? ERECTILE DYSFUNCTION 07/11/2007  ? GERD 07/11/2007  ? HYPERLIPIDEMIA 07/05/2007  ? HYPERTENSION 07/05/2007  ? LOW BACK PAIN 07/11/2007  ? Meralgia paresthetica 09/25/2008  ? PEPTIC ULCER DISEASE 07/05/2007  ? SINUSITIS- ACUTE-NOS 03/26/2009  ? SKIN LESION 03/18/2008  ? ? ?Past Surgical History:  ?Procedure Laterality Date  ? ANTERIOR CERVICAL DECOMP/DISCECTOMY FUSION N/A 01/16/2020  ? Procedure: Cervical 4-5 Cervical 5-6 Cervical 6-7 Anterior cervical decompression/discectomy/fusion;  Surgeon: Erline Levine, MD;  Location: [place];  Service: Neurosurgery;  Laterality: N/A;  3C  ? BASAL CELL CARCINOMA EXCISION Right 2011  ? shoulder area  ? CARPAL  TUNNEL RELEASE Left 01/16/2020  ? Procedure: LEFT CARPAL TUNNEL RELEASE;  Surgeon: Erline Levine, MD;  Location: [place];  Service: Neurosurgery;  Laterality: Left;  3C  ? CARPAL TUNNEL RELEASE Right 02/25/2020  ? Procedure: RIGHT CARPAL TUNNEL RELEASE;  Surgeon: Erline Levine, MD;  Location: [place];  Service: Neurosurgery;  Laterality: Right;  ? FRACTURE SURGERY    ? left wrist surgury/fracture  1985  ? SHOULDER ARTHROSCOPY WITH SUBACROMIAL DECOMPRESSION Left 03/30/2017  ? Procedure: SHOULDER ARTHROSCOPY WITH DEBRIDEMENT ROTATOR CUFF,SUBACROMIAL DECOMPRESSION AND OPEN TENODESIS;  Surgeon: [name], MD;  Location: [place];  Service: Orthopedics;  Laterality: Left;  SHOULDER ARTHROSCOPY WITH DEBRIDEMENT ROTATOR CUFF,SUBACROMIAL DECOMPRESSION AND POSSIBLE OPEN TENODESIS  ? WISDOM TOOTH EXTRACTION    ? ? ?Current Medications: ?Current Meds  ?Medication Sig  ? ALPRAZolam (XANAX) 1 MG tablet TAKE 1 TABLET BY MOUTH TWICE A DAY AS NEEDED  ? amLODipine (NORVASC) 5 MG tablet TAKE 1 TABLET BY MOUTH EVERY DAY  ? aspirin EC 81 MG tablet Take 81 mg by mouth every evening.  ? atorvastatin (LIPITOR) 80 MG tablet TAKE 1 TABLET BY MOUTH EVERY DAY  ? carisoprodol (SOMA) 350 MG tablet TAKE 1 TABLET BY MOUTH 3 TIMES A DAY AS NEEDED AS DIRECTED  ? Cholecalciferol 50 MCG (2000 UT) TABS 1 tab by mouth once daily  ? ezetimibe (ZETIA) 10 MG tablet Take 1 tablet (10 mg total) by mouth  daily.  ? FARXIGA 10 MG TABS tablet TAKE 1 TABLET BY MOUTH DAILY BEFORE BREAKFAST.  ? gabapentin (NEURONTIN) 300 MG capsule Take 1 capsule (300 mg total) by mouth at bedtime.  ? glipiZIDE (GLUCOTROL XL) 10 MG 24 hr tablet Take 1 tablet (10 mg total) by mouth daily with breakfast.  ? losartan (COZAAR) 100 MG tablet Take 1 tablet (100 mg total) by mouth daily.  ? metFORMIN (GLUCOPHAGE) 500 MG tablet TAKE 2 TABLETS BY MOUTH EVERY MORNING AND TAKE 2 TABLETS BY MOUTH EVERY EVENING  ? pioglitazone (ACTOS) 45 MG tablet TAKE 1 TABLET BY MOUTH EVERY DAY  ? Semaglutide  (RYBELSUS) 3 MG TABS Take 3 mg by mouth daily.  ? triamcinolone (NASACORT) 55 MCG/ACT AERO nasal inhaler Place 2 sprays into the nose daily.  ?  ? ?Allergies:   Codeine  ? ?Social History  ? ?Socioeconomic History  ? Marital status: Married  ?  Spouse name: Not on file  ? Number of children: 2  ? Years of education: Not on file  ? Highest education level: Not on file  ?Occupational History  ? Occupation: Librarian, academic for [employer]  ?Tobacco Use  ? Smoking status: Every Day  ?  Types: Cigars  ?  Last attempt to quit: 02/17/2014  ?  Years since quitting: 7.6  ? Smokeless tobacco: Current  ? Tobacco comments:  ?  3 cigars/ day  ?Vaping Use  ? Vaping Use: Former  ?Substance and Sexual Activity  ? Alcohol use: Yes  ?  Alcohol/week: 0.0 standard drinks  ?  Comment: social; weekends 6 beers  ? Drug use: No  ? Sexual activity: Not on file  ?Other Topics Concern  ? Not on file  ?Social History Narrative  ? Not on file  ? ?Social Determinants of Health  ? ?Financial Resource Strain: Not on file  ?Food Insecurity: Not on file  ?Transportation Needs: Not on file  ?Physical Activity: Not on file  ?Stress: Not on file  ?Social Connections: Not on file  ?  ?Social: Works Scientist, research (life sciences), Theatre manager, and Civil engineer, contracting and four grandchildren, married 70  ? ?Family History: ?The patient's family history includes Cancer in his maternal grandfather; Diabetes in his father, mother, and another family member; Heart disease in his maternal grandmother; Hypertension in his father, mother, and another family member. There is no history of Colon cancer. ? ?ROS:   ?Please see the history of present illness.    ? All other systems reviewed and are negative. ? ?EKGs/Labs/Other Studies Reviewed:   ? ?The following studies were reviewed today: ? ?EKG:  EKG is  ordered today.  The ekg ordered today demonstrates  ?10/1521: SR rate 86 ? ?Recent Labs: ?09/10/2021: ALT 15; BUN 18; Creatinine, Ser 0.82; Hemoglobin 17.9; Platelets  262.0; Potassium 4.3; Sodium 141; TSH 1.38  ?Recent Lipid Panel ?   ?Component Value Date/Time  ? CHOL 123 09/10/2021 0948  ? TRIG 208.0 (H) 09/10/2021 0948  ? TRIG 213 ([place]) 08/28/2006 1631  ? HDL 30.90 (L) 09/10/2021 0948  ? CHOLHDL 4 09/10/2021 0948  ? VLDL 41.6 (H) 09/10/2021 0948  ? [place] 64 03/15/2021 0924  ? [place] 96 03/09/2020 1108  ? LDLDIRECT 75.0 09/10/2021 0948  ?    ? ?Physical Exam:   ? ?VS:  BP (!) 157/88   Pulse 86   Ht '5\' 11"'$  (1.803 m)   Wt 254 lb (115.2 kg)   SpO2 95%   BMI 35.43 kg/m?    ? ?  Wt Readings from Last 3 Encounters:  ?10/27/21 254 lb (115.2 kg)  ?09/13/21 250 lb (113.4 kg)  ?03/15/21 252 lb (114.3 kg)  ?  ?Gen: no distress, morbid obesity   ?Neck: No JVD ?Ears: Bilateral Pilar Plate Sign ?Cardiac: No Rubs or Gallops, systolic murmur, RRR +2 radial pulses ?Respiratory: Clear to auscultation bilaterally, normal effort, normal  respiratory rate ?GI: Soft, nontender, non-distended  ?MS: No  edema;  moves all extremities ?Integument: Skin feels well ?Neuro:  At time of evaluation, alert and oriented to person/place/time/situation  ?Psych: Normal affect, patient feels ok ? ? ?ASSESSMENT:   ? ?1. Aortic atherosclerosis ([place])   ?2. Hypertension associated with type 2 diabetes mellitus ([place])   ?3. Morbid obesity ([place])   ?4. Heart murmur, systolic   ? ?PLAN:   ? ?HTN with DM ,Morbid Obesity ?HLD and aortic atherosclerosis ?Elevated CAC ?Active Smoking ?Aortic Valve calcification ?- reviewed CT with patient ?- continue aspirin 81 mg PO daily ?- continue Lipitor 80 mg, will add zetia 10 mg and repeat in 3 months ?- we had discussed potential NM Stress test, given his lack of sx we will defer unless new sx occur ?- new systolic murmur; will get echo suspect aortic valve sclerosis, if WMA will get cath ?- AMB BP will be started and if worsening, norvasc 5 mg ?- losartan 100 mg PO daily ?-  we have discussed dietary interventions  ?- we have discussed smoking cessation ?   ? ? ?Medication  Adjustments/Labs and Tests Ordered: ?Current medicines are reviewed at length with the patient today.  Concerns regarding medicines are outlined above.  ?Orders Placed This Encounter  ?Procedures  ? ALT  ? Lipid panel  ?

## 2021-11-02 LAB — HM DIABETES EYE EXAM

## 2021-11-04 ENCOUNTER — Other Ambulatory Visit: Payer: Self-pay | Admitting: Internal Medicine

## 2021-11-04 NOTE — Telephone Encounter (Signed)
Please refill as per office routine med refill policy (all routine meds to be refilled for 3 mo or monthly (per pt preference) up to one year from last visit, then month to month grace period for 3 mo, then further med refills will have to be denied) ? ?

## 2021-11-15 ENCOUNTER — Ambulatory Visit (HOSPITAL_COMMUNITY): Payer: 59 | Attending: Cardiology

## 2021-11-15 DIAGNOSIS — I080 Rheumatic disorders of both mitral and aortic valves: Secondary | ICD-10-CM

## 2021-11-15 DIAGNOSIS — I77810 Thoracic aortic ectasia: Secondary | ICD-10-CM | POA: Diagnosis not present

## 2021-11-15 DIAGNOSIS — I152 Hypertension secondary to endocrine disorders: Secondary | ICD-10-CM | POA: Diagnosis present

## 2021-11-15 DIAGNOSIS — I517 Cardiomegaly: Secondary | ICD-10-CM

## 2021-11-15 DIAGNOSIS — R011 Cardiac murmur, unspecified: Secondary | ICD-10-CM | POA: Diagnosis not present

## 2021-11-15 DIAGNOSIS — E1159 Type 2 diabetes mellitus with other circulatory complications: Secondary | ICD-10-CM | POA: Insufficient documentation

## 2021-11-15 LAB — ECHOCARDIOGRAM COMPLETE
Area-P 1/2: 4.68 cm2
S' Lateral: 2.7 cm

## 2021-11-22 ENCOUNTER — Other Ambulatory Visit: Payer: Self-pay | Admitting: Internal Medicine

## 2021-12-08 ENCOUNTER — Other Ambulatory Visit: Payer: Self-pay | Admitting: Internal Medicine

## 2021-12-08 NOTE — Telephone Encounter (Signed)
Please refill as per office routine med refill policy (all routine meds to be refilled for 3 mo or monthly (per pt preference) up to one year from last visit, then month to month grace period for 3 mo, then further med refills will have to be denied) ? ?

## 2022-01-09 ENCOUNTER — Other Ambulatory Visit: Payer: Self-pay | Admitting: Internal Medicine

## 2022-01-31 ENCOUNTER — Other Ambulatory Visit: Payer: 59

## 2022-01-31 DIAGNOSIS — I700 Atherosclerosis of aorta: Secondary | ICD-10-CM

## 2022-01-31 LAB — LIPID PANEL
Chol/HDL Ratio: 2.8 ratio (ref 0.0–5.0)
Cholesterol, Total: 113 mg/dL (ref 100–199)
HDL: 40 mg/dL (ref >39)
LDL Chol Calc (NIH): 58 mg/dL (ref 0–99)
Triglycerides: 73 mg/dL (ref 0–149)
VLDL Cholesterol Cal: 15 mg/dL (ref 5–40)

## 2022-01-31 LAB — ALT: ALT: 18 IU/L (ref 0–44)

## 2022-03-16 ENCOUNTER — Other Ambulatory Visit (INDEPENDENT_AMBULATORY_CARE_PROVIDER_SITE_OTHER): Payer: 59

## 2022-03-16 DIAGNOSIS — E1165 Type 2 diabetes mellitus with hyperglycemia: Secondary | ICD-10-CM

## 2022-03-16 DIAGNOSIS — E559 Vitamin D deficiency, unspecified: Secondary | ICD-10-CM

## 2022-03-16 LAB — HEPATIC FUNCTION PANEL
ALT: 19 U/L (ref 0–53)
AST: 15 U/L (ref 0–37)
Albumin: 4.5 g/dL (ref 3.5–5.2)
Alkaline Phosphatase: 55 U/L (ref 39–117)
Bilirubin, Direct: 0.2 mg/dL (ref 0.0–0.3)
Total Bilirubin: 0.5 mg/dL (ref 0.2–1.2)
Total Protein: 6.7 g/dL (ref 6.0–8.3)

## 2022-03-16 LAB — BASIC METABOLIC PANEL
BUN: 19 mg/dL (ref 6–23)
CO2: 25 mEq/L (ref 19–32)
Calcium: 9.2 mg/dL (ref 8.4–10.5)
Chloride: 105 mEq/L (ref 96–112)
Creatinine, Ser: 0.93 mg/dL (ref 0.40–1.50)
GFR: 90.77 mL/min (ref >60.00)
Glucose, Bld: 133 mg/dL — ABNORMAL HIGH (ref 70–99)
Potassium: 4.6 mEq/L (ref 3.5–5.1)
Sodium: 139 mEq/L (ref 135–145)

## 2022-03-16 LAB — LIPID PANEL
Cholesterol: 82 mg/dL (ref 0–200)
HDL: 31.5 mg/dL — ABNORMAL LOW (ref >39.00)
LDL Cholesterol: 34 mg/dL (ref 0–99)
NonHDL: 50.53
Total CHOL/HDL Ratio: 3
Triglycerides: 82 mg/dL (ref 0.0–149.0)
VLDL: 16.4 mg/dL (ref 0.0–40.0)

## 2022-03-16 LAB — HEMOGLOBIN A1C: Hgb A1c MFr Bld: 7 % — ABNORMAL HIGH (ref 4.6–6.5)

## 2022-03-16 LAB — VITAMIN D 25 HYDROXY (VIT D DEFICIENCY, FRACTURES): VITD: 22.25 ng/mL — ABNORMAL LOW (ref 30.00–100.00)

## 2022-03-18 ENCOUNTER — Ambulatory Visit: Payer: 59 | Admitting: Internal Medicine

## 2022-03-18 ENCOUNTER — Encounter: Payer: Self-pay | Admitting: Internal Medicine

## 2022-03-18 ENCOUNTER — Other Ambulatory Visit: Payer: Self-pay | Admitting: Internal Medicine

## 2022-03-18 VITALS — BP 126/70 | HR 75 | Temp 98.9°F | Ht 71.0 in | Wt 268.0 lb

## 2022-03-18 DIAGNOSIS — I152 Hypertension secondary to endocrine disorders: Secondary | ICD-10-CM

## 2022-03-18 DIAGNOSIS — E1159 Type 2 diabetes mellitus with other circulatory complications: Secondary | ICD-10-CM

## 2022-03-18 DIAGNOSIS — E559 Vitamin D deficiency, unspecified: Secondary | ICD-10-CM

## 2022-03-18 DIAGNOSIS — E7800 Pure hypercholesterolemia, unspecified: Secondary | ICD-10-CM

## 2022-03-18 DIAGNOSIS — E538 Deficiency of other specified B group vitamins: Secondary | ICD-10-CM

## 2022-03-18 DIAGNOSIS — L989 Disorder of the skin and subcutaneous tissue, unspecified: Secondary | ICD-10-CM

## 2022-03-18 DIAGNOSIS — E1165 Type 2 diabetes mellitus with hyperglycemia: Secondary | ICD-10-CM

## 2022-03-18 DIAGNOSIS — Z125 Encounter for screening for malignant neoplasm of prostate: Secondary | ICD-10-CM

## 2022-03-18 MED ORDER — RYBELSUS 7 MG PO TABS: 7.0000 mg | ORAL_TABLET | Freq: Every day | ORAL | 3 refills | Status: DC

## 2022-03-18 MED ORDER — AMLODIPINE BESYLATE 10 MG PO TABS: 10.0000 mg | ORAL_TABLET | Freq: Every day | ORAL | 3 refills | Status: DC

## 2022-03-18 NOTE — Progress Notes (Signed)
Patient ID: ARVELL PULSIFER, male   DOB: [birth year], 56 y.o.   MRN: [id number]        Chief Complaint: follow up HTN, HLD and DM, skin lesion       HPI:  Jeffrey [name] is a 56 y.o. male here overall doing ok.  Has a skin lesion increased size, will need derm referral.   Pt denies chest pain, increased sob or doe, wheezing, orthopnea, PND, increased LE swelling, palpitations, dizziness or syncope.   Pt denies polydipsia, polyuria, or new focal neuro s/s.    Pt denies fever, wt loss, night sweats, loss of appetite, or other constitutional symptoms  Has f/u cardiology appt oct 2023       Wt Readings from Last 3 Encounters:  03/18/22 268 lb (121.6 kg)  10/27/21 254 lb (115.2 kg)  09/13/21 250 lb (113.4 kg)   BP Readings from Last 3 Encounters:  03/18/22 126/70  10/27/21 (!) 157/88  09/13/21 128/70         Past Medical History:  Diagnosis Date   Allergy    ANXIETY 07/05/2007   Arthritis 08/2015   neck and back   Cancer ([place])    basal cell   CARPAL TUNNEL SYNDROME, BILATERAL 07/05/2007   Cervicalgia 03/18/2008   DEPRESSION 07/05/2007   DIABETES MELLITUS, TYPE II 07/05/2007   ERECTILE DYSFUNCTION 07/11/2007   GERD 07/11/2007   HYPERLIPIDEMIA 07/05/2007   HYPERTENSION 07/05/2007   LOW BACK PAIN 07/11/2007   Meralgia paresthetica 09/25/2008   PEPTIC ULCER DISEASE 07/05/2007   SINUSITIS- ACUTE-NOS 03/26/2009   SKIN LESION 03/18/2008   Past Surgical History:  Procedure Laterality Date   ANTERIOR CERVICAL DECOMP/DISCECTOMY FUSION N/A 01/16/2020   Procedure: Cervical 4-5 Cervical 5-6 Cervical 6-7 Anterior cervical decompression/discectomy/fusion;  Surgeon: Erline Levine, MD;  Location: [place];  Service: Neurosurgery;  Laterality: N/A;  3C   BASAL CELL CARCINOMA EXCISION Right 2011   shoulder area   CARPAL TUNNEL RELEASE Left 01/16/2020   Procedure: LEFT CARPAL TUNNEL RELEASE;  Surgeon: Erline Levine, MD;  Location: [place];  Service: Neurosurgery;  Laterality: Left;  3C   CARPAL TUNNEL  RELEASE Right 02/25/2020   Procedure: RIGHT CARPAL TUNNEL RELEASE;  Surgeon: Erline Levine, MD;  Location: [place];  Service: Neurosurgery;  Laterality: Right;   FRACTURE SURGERY     left wrist surgury/fracture  1985   SHOULDER ARTHROSCOPY WITH SUBACROMIAL DECOMPRESSION Left 03/30/2017   Procedure: SHOULDER ARTHROSCOPY WITH DEBRIDEMENT ROTATOR CUFF,SUBACROMIAL DECOMPRESSION AND OPEN TENODESIS;  Surgeon: [name], MD;  Location: [place];  Service: Orthopedics;  Laterality: Left;  SHOULDER ARTHROSCOPY WITH DEBRIDEMENT ROTATOR CUFF,SUBACROMIAL DECOMPRESSION AND POSSIBLE OPEN TENODESIS   WISDOM TOOTH EXTRACTION      reports that he quit smoking about 8 years ago. His smoking use included cigars. He has quit using smokeless tobacco. He reports current alcohol use. He reports that he does not use drugs. family history includes Cancer in his maternal grandfather; Diabetes in his father, mother, and another family member; Heart disease in his maternal grandmother; Hypertension in his father, mother, and another family member. Allergies  Allergen Reactions   Codeine Itching and Other (See Comments)   Current Outpatient Medications on File Prior to Visit  Medication Sig Dispense Refill   ALPRAZolam (XANAX) 1 MG tablet TAKE 1 TABLET BY MOUTH TWICE A DAY AS NEEDED 60 tablet 2   aspirin EC 81 MG tablet Take 81 mg by mouth every evening.     atorvastatin (LIPITOR) 80 MG tablet TAKE  1 TABLET BY MOUTH EVERY DAY 90 tablet 2   carisoprodol (SOMA) 350 MG tablet TAKE 1 TABLET BY MOUTH 3 TIMES A DAY AS NEEDED AS DIRECTED 90 tablet 2   Cholecalciferol 50 MCG (2000 UT) TABS 1 tab by mouth once daily 30 tablet 99   ezetimibe (ZETIA) 10 MG tablet Take 1 tablet (10 mg total) by mouth daily. 90 tablet 3   FARXIGA 10 MG TABS tablet TAKE 1 TABLET BY MOUTH DAILY BEFORE BREAKFAST. 90 tablet 3   glipiZIDE (GLUCOTROL XL) 10 MG 24 hr tablet TAKE 1 TABLET (10 MG TOTAL) BY MOUTH DAILY WITH BREAKFAST. 90 tablet 3   losartan  (COZAAR) 100 MG tablet TAKE 1 TABLET BY MOUTH EVERY DAY 90 tablet 3   metFORMIN (GLUCOPHAGE) 500 MG tablet TAKE 2 TABLETS BY MOUTH EVERY MORNING AND TAKE 2 TABLETS BY MOUTH EVERY EVENING 360 tablet 3   pioglitazone (ACTOS) 45 MG tablet TAKE 1 TABLET BY MOUTH EVERY DAY 90 tablet 3   triamcinolone (NASACORT) 55 MCG/ACT AERO nasal inhaler Place 2 sprays into the nose daily. 3 each 3   No current facility-administered medications on file prior to visit.        ROS:  All others reviewed and negative.  Objective        PE:  BP 126/70 (BP Location: Right Arm, Patient Position: Sitting, Cuff Size: Large)   Pulse 75   Temp 98.9 F (37.2 C) (Oral)   Ht '5\' 11"'$  (1.803 m)   Wt 268 lb (121.6 kg)   SpO2 96%   BMI 37.38 kg/m                 Constitutional: Pt appears in NAD               HENT: Head: NCAT.                Right Ear: External ear normal.                 Left Ear: External ear normal.                Eyes: . Pupils are equal, round, and reactive to light. Conjunctivae and EOM are normal               Nose: without d/c or deformity               Neck: Neck supple. Gross normal ROM               Cardiovascular: Normal rate and regular rhythm.                 Pulmonary/Chest: Effort normal and breath sounds without rales or wheezing.                Abd:  Soft, NT, ND, + BS, no organomegaly               Neurological: Pt is alert. At baseline orientation, motor grossly intact               Skin: Skin is warm. No rashes, no other new lesions, LE edema - none               Psychiatric: Pt behavior is normal without agitation   Micro: none  Cardiac tracings I have personally interpreted today:  none  Pertinent Radiological findings (summarize): none   Lab Results  Component Value Date   WBC 9.5 09/10/2021   HGB 17.9 (  H) 09/10/2021   HCT 53.5 (H) 09/10/2021   PLT 262.0 09/10/2021   GLUCOSE 133 (H) 03/16/2022   CHOL 82 03/16/2022   TRIG 82.0 03/16/2022   HDL 31.50 (L) 03/16/2022    LDLDIRECT 75.0 09/10/2021   LDLCALC 34 03/16/2022   ALT 19 03/16/2022   AST 15 03/16/2022   NA 139 03/16/2022   K 4.6 03/16/2022   CL 105 03/16/2022   CREATININE 0.93 03/16/2022   BUN 19 03/16/2022   CO2 25 03/16/2022   TSH 1.38 09/10/2021   PSA 0.55 09/10/2021   HGBA1C 7.0 (H) 03/16/2022   MICROALBUR <0.7 09/10/2021   Assessment/Plan:  Jeffrey [name] is a 56 y.o. White or Caucasian [1] male with  has a past medical history of Allergy, ANXIETY (07/05/2007), Arthritis (08/2015), Cancer ([place]), CARPAL TUNNEL SYNDROME, BILATERAL (07/05/2007), Cervicalgia (03/18/2008), DEPRESSION (07/05/2007), DIABETES MELLITUS, TYPE II (07/05/2007), ERECTILE DYSFUNCTION (07/11/2007), GERD (07/11/2007), HYPERLIPIDEMIA (07/05/2007), HYPERTENSION (07/05/2007), LOW BACK PAIN (07/11/2007), Meralgia paresthetica (09/25/2008), PEPTIC ULCER DISEASE (07/05/2007), SINUSITIS- ACUTE-NOS (03/26/2009), and SKIN LESION (03/18/2008).  Diabetes ([place]) Lab Results  Component Value Date   HGBA1C 7.0 (H) 03/16/2022   Uncontrolled, hard to lose wt, now for rybelsus increased to 7 mg qd,, pt to continue current medical treatment farxiga 10 mg qd, glucotrol xl 10 qd, metformin ER 500 - 2 tab bid, actos 45 as trying to avoid insulin   Hyperlipidemia Lab Results  Component Value Date   LDLCALC 34 03/16/2022   Stable, pt to continue current statin lipitor 80 mg  qd   Hypertension associated with type 2 diabetes mellitus ([facility]) BP Readings from Last 3 Encounters:  03/18/22 126/70  10/27/21 (!) 157/88  09/13/21 128/70   Uncontrolled at home per pt > 140/90, pt to continue medical treatment losartan 100 mg qd and increase amlodipine 10 mg qd   Vitamin D deficiency Last vitamin D Lab Results  Component Value Date   VD25OH 22.25 (L) 03/16/2022   Low, reminded to start oral replacement   Skin lesion Pt requests referral new dermatology as [facility]  Followup: Return in about 6 months (around  09/18/2022).  Cathlean Cower, MD 03/20/2022 8:16 PM [facility]

## 2022-03-18 NOTE — Patient Instructions (Signed)
Ok to increase the rybelsus to 7 mg per day  Ok to increase the amlodipine to 10 mg per day  You will be contacted regarding the referral for: Dermatology  Please continue all other medications as before, and refills have been done if requested.  Please have the pharmacy call with any other refills you may need.  Please continue your efforts at being more active, low cholesterol diet, and weight control.  You are otherwise up to date with prevention measures today.  Please keep your appointments with your specialists as you may have planned - Cardiology in Oct 2023  Please make an Appointment to return in 6 months, or sooner if needed, also with Lab Appointment for testing done 3-5 days before at the [place] (so this is for TWO appointments - please see the scheduling desk as you leave)

## 2022-03-20 ENCOUNTER — Encounter: Payer: Self-pay | Admitting: Internal Medicine

## 2022-03-20 DIAGNOSIS — L989 Disorder of the skin and subcutaneous tissue, unspecified: Secondary | ICD-10-CM | POA: Insufficient documentation

## 2022-03-20 NOTE — Assessment & Plan Note (Signed)
Last vitamin D Lab Results  Component Value Date   VD25OH 22.25 (L) 03/16/2022   Low, reminded to start oral replacement

## 2022-03-20 NOTE — Addendum Note (Signed)
Addended by: Biagio Borg on: 03/20/2022 08:17 PM   Modules accepted: Orders

## 2022-03-20 NOTE — Assessment & Plan Note (Signed)
BP Readings from Last 3 Encounters:  03/18/22 126/70  10/27/21 (!) 157/88  09/13/21 128/70   Uncontrolled at home per pt > 140/90, pt to continue medical treatment losartan 100 mg qd and increase amlodipine 10 mg qd

## 2022-03-20 NOTE — Assessment & Plan Note (Signed)
Pt requests referral new dermatology as [place] [facility] is closed

## 2022-03-20 NOTE — Assessment & Plan Note (Signed)
Lab Results  Component Value Date   HGBA1C 7.0 (H) 03/16/2022   Uncontrolled, hard to lose wt, now for rybelsus increased to 7 mg qd,, pt to continue current medical treatment farxiga 10 mg qd, glucotrol xl 10 qd, metformin ER 500 - 2 tab bid, actos 45 as trying to avoid insulin

## 2022-03-20 NOTE — Assessment & Plan Note (Signed)
Lab Results  Component Value Date   LDLCALC 34 03/16/2022   Stable, pt to continue current statin lipitor 80 mg  qd

## 2022-03-24 ENCOUNTER — Encounter: Payer: Self-pay | Admitting: Internal Medicine

## 2022-03-25 NOTE — Telephone Encounter (Signed)
Noted../lmb 

## 2022-04-13 ENCOUNTER — Other Ambulatory Visit: Payer: Self-pay | Admitting: Internal Medicine

## 2022-04-13 NOTE — Telephone Encounter (Signed)
Please refill as per office routine med refill policy (all routine meds to be refilled for 3 mo or monthly (per pt preference) up to one year from last visit, then month to month grace period for 3 mo, then further med refills will have to be denied) ? ?

## 2022-04-20 ENCOUNTER — Other Ambulatory Visit: Payer: Self-pay | Admitting: Internal Medicine

## 2022-04-20 NOTE — Telephone Encounter (Signed)
Please refill as per office routine med refill policy (all routine meds to be refilled for 3 mo or monthly (per pt preference) up to one year from last visit, then month to month grace period for 3 mo, then further med refills will have to be denied) ? ?

## 2022-05-04 ENCOUNTER — Other Ambulatory Visit: Payer: Self-pay | Admitting: Internal Medicine

## 2022-05-29 NOTE — Progress Notes (Unsigned)
Cardiology Office Note:    Date:  05/30/2022   ID:  Jeffrey [name], DOB 28-Sep-1965, MRN [id number]  PCP:  Biagio Borg, MD   [facility] Providers Cardiologist:  Werner Lean, MD     Referring MD: Biagio Borg, MD   CC: HCM Discussions  History of Present Illness:    Jeffrey [name] is a 56 y.o. male with a hx of HTN, HLD elevated CAC, Aortic Valve calcification  and DM, active smoking who presents for evaluation.  2023: He has two sisters.  He had an echo that showed a septal thickness of 13 mm.  His sister has a septal thickness nearing 15 mm.  Stopped smoking after our last visit.  Patient notes no SOB at rest and no DOE Notes no fatigue. Notes no palpitations Notes that he has had activity associated burning chest pain; has a history of a hiatal hernia. Notes No dizziness. Notes no syncope. Notable family events include his sister, Edwena Bunde and Ardyth Gal having septal hypertrophy.  Shirlean Mylar may have HCM.  They see Dr. Percival Spanish and Shirlean Mylar is getting a CMR.  Loetta Rough  and Mr. Belue have given [place] consent to discuss their care as a family.  His other sister Venida Jarvis is pending echo. He has one Health visitor. She has kids.   Past Medical History:  Diagnosis Date   Allergy    ANXIETY 07/05/2007   Arthritis 08/2015   neck and back   Cancer ([place])    basal cell   CARPAL TUNNEL SYNDROME, BILATERAL 07/05/2007   Cervicalgia 03/18/2008   DEPRESSION 07/05/2007   DIABETES MELLITUS, TYPE II 07/05/2007   ERECTILE DYSFUNCTION 07/11/2007   GERD 07/11/2007   HYPERLIPIDEMIA 07/05/2007   HYPERTENSION 07/05/2007   LOW BACK PAIN 07/11/2007   Meralgia paresthetica 09/25/2008   PEPTIC ULCER DISEASE 07/05/2007   SINUSITIS- ACUTE-NOS 03/26/2009   SKIN LESION 03/18/2008    Past Surgical History:  Procedure Laterality Date   ANTERIOR CERVICAL DECOMP/DISCECTOMY FUSION N/A 01/16/2020   Procedure: Cervical 4-5 Cervical 5-6 Cervical 6-7 Anterior cervical  decompression/discectomy/fusion;  Surgeon: Erline Levine, MD;  Location: [place];  Service: Neurosurgery;  Laterality: N/A;  3C   BASAL CELL CARCINOMA EXCISION Right 2011   shoulder area   CARPAL TUNNEL RELEASE Left 01/16/2020   Procedure: LEFT CARPAL TUNNEL RELEASE;  Surgeon: Erline Levine, MD;  Location: [place];  Service: Neurosurgery;  Laterality: Left;  3C   CARPAL TUNNEL RELEASE Right 02/25/2020   Procedure: RIGHT CARPAL TUNNEL RELEASE;  Surgeon: Erline Levine, MD;  Location: [place];  Service: Neurosurgery;  Laterality: Right;   FRACTURE SURGERY     left wrist surgury/fracture  1985   SHOULDER ARTHROSCOPY WITH SUBACROMIAL DECOMPRESSION Left 03/30/2017   Procedure: SHOULDER ARTHROSCOPY WITH DEBRIDEMENT ROTATOR CUFF,SUBACROMIAL DECOMPRESSION AND OPEN TENODESIS;  Surgeon: [name], MD;  Location: [place];  Service: Orthopedics;  Laterality: Left;  SHOULDER ARTHROSCOPY WITH DEBRIDEMENT ROTATOR CUFF,SUBACROMIAL DECOMPRESSION AND POSSIBLE OPEN TENODESIS   WISDOM TOOTH EXTRACTION      Current Medications: Current Meds  Medication Sig   ALPRAZolam (XANAX) 1 MG tablet TAKE 1 TABLET BY MOUTH TWICE A DAY AS NEEDED   amLODipine (NORVASC) 10 MG tablet Take 1 tablet (10 mg total) by mouth daily.   aspirin EC 81 MG tablet Take 81 mg by mouth every evening.   atorvastatin (LIPITOR) 80 MG tablet TAKE 1 TABLET BY MOUTH EVERY DAY   carisoprodol (SOMA) 350 MG tablet TAKE 1 TABLET BY  MOUTH 3 TIMES A DAY AS NEEDED AS DIRECTED   Cholecalciferol 50 MCG (2000 UT) TABS 1 tab by mouth once daily   ezetimibe (ZETIA) 10 MG tablet Take 1 tablet (10 mg total) by mouth daily.   FARXIGA 10 MG TABS tablet TAKE 1 TABLET BY MOUTH DAILY BEFORE BREAKFAST.   gabapentin (NEURONTIN) 300 MG capsule TAKE 1 CAPSULE BY MOUTH EVERYDAY AT BEDTIME   glipiZIDE (GLUCOTROL XL) 10 MG 24 hr tablet TAKE 1 TABLET (10 MG TOTAL) BY MOUTH DAILY WITH BREAKFAST.   losartan (COZAAR) 100 MG tablet TAKE 1 TABLET BY MOUTH EVERY DAY   metFORMIN  (GLUCOPHAGE) 500 MG tablet TAKE 2 TABLETS BY MOUTH EVERY MORNING AND TAKE 2 TABLETS BY MOUTH EVERY EVENING   pioglitazone (ACTOS) 45 MG tablet TAKE 1 TABLET BY MOUTH EVERY DAY   Semaglutide (RYBELSUS) 7 MG TABS Take 7 mg by mouth daily.   triamcinolone (NASACORT) 55 MCG/ACT AERO nasal inhaler Place 2 sprays into the nose daily.     Allergies:   Codeine   Social History   Socioeconomic History   Marital status: Married    Spouse name: Not on file   Number of children: 2   Years of education: Not on file   Highest education level: Not on file  Occupational History   Occupation: Librarian, academic for [employer]  Tobacco Use   Smoking status: Former    Types: Cigars    Quit date: 10/31/2013    Years since quitting: 8.5   Smokeless tobacco: Former   Tobacco comments:    3 cigars/ day  Vaping Use   Vaping Use: Former  Substance and Sexual Activity   Alcohol use: Yes    Alcohol/week: 0.0 standard drinks of alcohol    Comment: social; weekends 6 beers   Drug use: No   Sexual activity: Not on file  Other Topics Concern   Not on file  Social History Narrative   Not on file   Social Determinants of Health   Financial Resource Strain: Not on file  Food Insecurity: Not on file  Transportation Needs: Not on file  Physical Activity: Not on file  Stress: Not on file  Social Connections: Not on file    Social: Works shipping, maintenance, and Civil engineer, contracting and four grandchildren, married 52  Has three sisters, one biological daugther Father had MI 2023 and had HF and ESRD, passed away  Family History: The patient's family history includes Cancer in his maternal grandfather; Diabetes in his father, mother, and another family member; Heart disease in his maternal grandmother; Hypertension in his father, mother, and another family member. There is no history of Colon cancer.  ROS:   Please see the history of present illness.     All other systems reviewed  and are negative.  EKGs/Labs/Other Studies Reviewed:    The following studies were reviewed today:  EKG:  EKG is  ordered today.  The ekg ordered today demonstrates  10/1521: SR rate 86     ECHO COMPLETE WO IMAGING ENHANCING AGENT 11/15/2021  Narrative ECHOCARDIOGRAM REPORT    Patient Name:   Jeffrey [name] Date of Exam: 11/15/2021 Medical Rec #:  [id number]         Height:       71.0 in Accession #:    [id number]        Weight:       254.0 lb Date of Birth:  March 10, 1966  BSA:          2.334 m Patient Age:    52 years          BP:           157/88 mmHg Patient Gender: M                 HR:           78 bpm. Exam Location:  [place]  Procedure: 2D Echo, 3D Echo, Cardiac Doppler, Color Doppler and Strain Analysis  Indications:    R01.1 Murmur  History:        Patient has no prior history of Echocardiogram examinations. Signs/Symptoms:Murmur; Risk Factors:Hypertension, Dyslipidemia, Diabetes, Current Smoker and Morbid obesity.  Sonographer:    Basilia Jumbo [facility], RDCS Referring Phys: [id number] [facility] A Kathan Kirker  IMPRESSIONS   1. Left ventricular ejection fraction, by estimation, is 60 to 65%. Left ventricular ejection fraction by 3D volume is 59 %. The left ventricle has normal function. The left ventricle has no regional wall motion abnormalities. There is mild left ventricular hypertrophy of the basal-septal segment (13 mm). Left ventricular diastolic parameters were normal. The average left ventricular global longitudinal strain is -22.3 %. The global longitudinal strain is normal. 2. Right ventricular systolic function is normal. The right ventricular size is mildly enlarged. Tricuspid regurgitation signal is inadequate for assessing PA pressure. 3. The mitral valve is normal in structure. No evidence of mitral valve regurgitation. No evidence of mitral stenosis. 4. The aortic valve is tricuspid. Aortic valve regurgitation is not visualized. Aortic valve  sclerosis/calcification is present, without any evidence of aortic stenosis. 5. Aortic dilatation noted. There is mild dilatation of the aortic root, measuring 38 mm. 6. The inferior vena cava is normal in size with greater than 50% respiratory variability, suggesting right atrial pressure of 3 mmHg.  FINDINGS Left Ventricle: Left ventricular ejection fraction, by estimation, is 60 to 65%. Left ventricular ejection fraction by 3D volume is 59 %. The left ventricle has normal function. The left ventricle has no regional wall motion abnormalities. The average left ventricular global longitudinal strain is -22.3 %. The global longitudinal strain is normal. The left ventricular internal cavity size was normal in size. There is mild left ventricular hypertrophy of the basal-septal segment. Left ventricular diastolic parameters were normal. Normal left ventricular filling pressure.  Right Ventricle: The right ventricular size is mildly enlarged. No increase in right ventricular wall thickness. Right ventricular systolic function is normal. Tricuspid regurgitation signal is inadequate for assessing PA pressure.  Left Atrium: Left atrial size was normal in size.  Right Atrium: Right atrial size was normal in size.  Pericardium: There is no evidence of pericardial effusion.  Mitral Valve: The mitral valve is normal in structure. Mild mitral annular calcification. No evidence of mitral valve regurgitation. No evidence of mitral valve stenosis.  Tricuspid Valve: The tricuspid valve is normal in structure. Tricuspid valve regurgitation is not demonstrated. No evidence of tricuspid stenosis.  Aortic Valve: The aortic valve is tricuspid. Aortic valve regurgitation is not visualized. Aortic valve sclerosis/calcification is present, without any evidence of aortic stenosis.  Pulmonic Valve: The pulmonic valve was normal in structure. Pulmonic valve regurgitation is not visualized. No evidence of pulmonic  stenosis.  Aorta: Aortic dilatation noted. There is mild dilatation of the aortic root, measuring 38 mm.  Venous: The inferior vena cava is normal in size with greater than 50% respiratory variability, suggesting right atrial pressure of 3 mmHg.  IAS/Shunts: No  atrial level shunt detected by color flow Doppler.   LEFT VENTRICLE PLAX 2D LVIDd:         4.30 cm         Diastology LVIDs:         2.70 cm         LV e' medial:    9.51 cm/s LV PW:         1.10 cm         LV E/e' medial:  13.0 LV IVS:        1.20 cm         LV e' lateral:   10.80 cm/s LVOT diam:     2.30 cm         LV E/e' lateral: 11.5 LV SV:         113 LV SV Index:   49              2D LVOT Area:     4.15 cm        Longitudinal Strain 2D Strain GLS  -21.9 % (A2C): 2D Strain GLS  -23.5 % (A3C): 2D Strain GLS  -21.4 % (A4C): 2D Strain GLS  -22.3 % Avg:  3D Volume EF LV 3D EF:    Left ventricul ar ejection fraction by 3D volume is 59 %.  3D Volume EF: 3D EF:        59 % LV EDV:       112 ml LV ESV:       46 ml LV SV:        66 ml  RIGHT VENTRICLE             IVC RV Basal diam:  4.00 cm     IVC diam: 2.00 cm RV S prime:     16.40 cm/s TAPSE (M-mode): 2.4 cm  LEFT ATRIUM             Index        RIGHT ATRIUM           Index LA diam:        3.80 cm 1.63 cm/m   RA Pressure: 8.00 mmHg LA Vol (A2C):   40.6 ml 17.39 ml/m  RA Area:     16.40 cm LA Vol (A4C):   44.1 ml 18.89 ml/m  RA Volume:   40.60 ml  17.39 ml/m LA Biplane Vol: 45.2 ml 19.37 ml/m AORTIC VALVE LVOT Vmax:   128.00 cm/s LVOT Vmean:  88.600 cm/s LVOT VTI:    0.273 m  AORTA Ao Root diam: 3.80 cm Ao Asc diam:  3.50 cm  MITRAL VALVE                TRICUSPID VALVE Estimated RAP:  8.00 mmHg MV Decel Time: 162 msec MV E velocity: 124.00 cm/s  SHUNTS MV A velocity: 94.30 cm/s   Systemic VTI:  0.27 m MV E/A ratio:  1.31         Systemic Diam: 2.30 cm  Fransico Him MD Electronically signed by Fransico Him MD Signature Date/Time:  11/15/2021/11:38:49 AM    CT CARDIAC SCORING (SELF PAY ONLY) 10/25/2021  Addendum 10/25/2021  1:08 PM ADDENDUM REPORT: 10/25/2021 13:05  CLINICAL DATA:  Cardiovascular Disease Risk stratification  EXAM: Coronary Calcium Score  TECHNIQUE: A gated, non-contrast computed tomography scan of the heart was performed using 91m slice thickness. Axial images were analyzed on a dedicated workstation. Calcium scoring of the coronary arteries was performed using the Agatston  method.  FINDINGS: Coronary arteries: Normal origins.  Coronary Calcium Score:  Left main: 0  Left anterior descending artery: 497  Left circumflex artery: 101  Right coronary artery: 20  Total: 619  Percentile: 96  Pericardium: Normal.  Ascending Aorta: Normal caliber.  Non-cardiac: See separate report from [facility].  IMPRESSION: Coronary calcium score of 619. This was 20 percentile for age-, race-, and sex-matched controls.  RECOMMENDATIONS: Coronary artery calcium (CAC) score is a strong predictor of incident coronary heart disease (CHD) and provides predictive information beyond traditional risk factors. CAC scoring is reasonable to use in the decision to withhold, postpone, or initiate statin therapy in intermediate-risk or selected borderline-risk asymptomatic adults (age 70-75 years and LDL-C >=70 to <190 mg/dL) who do not have diabetes or established atherosclerotic cardiovascular disease (ASCVD).* In intermediate-risk (10-year ASCVD risk >=7.5% to <20%) adults or selected borderline-risk (10-year ASCVD risk >=5% to <7.5%) adults in whom a CAC score is measured for the purpose of making a treatment decision the following recommendations have been made:  If CAC=0, it is reasonable to withhold statin therapy and reassess in 5 to 10 years, as long as higher risk conditions are absent (diabetes mellitus, family history of premature CHD in first degree relatives (males <55 years;  females <65 years), cigarette smoking, or LDL >=190 mg/dL).  If CAC is 1 to 99, it is reasonable to initiate statin therapy for patients >=34 years of age.  If CAC is >=100 or >=75th percentile, it is reasonable to initiate statin therapy at any age.  Cardiology referral should be considered for patients with CAC scores >=400 or >=75th percentile.  *2018 AHA/ACC/AACVPR/AAPA/ABC/ACPM/ADA/AGS/APhA/ASPC/NLA/PCNA Guideline on the Management of Blood Cholesterol: A Report of the American College of Cardiology/American Heart Association Task Force on Clinical Practice Guidelines. J Am Coll Cardiol. 2019;73(24):3168-3209.   Electronically Signed By: Candee Furbish M.D. On: 10/25/2021 13:05  Narrative EXAM: OVER-READ INTERPRETATION  CT CHEST  The following report is an over-read performed by radiologist Dr. Aletta Edouard of [facility], [place] on 10/25/2021. This over-read does not include interpretation of cardiac or coronary anatomy or pathology. The coronary calcium score interpretation by the cardiologist is attached.  COMPARISON:  None.  FINDINGS: Vascular: Mild atherosclerosis of the visualized thoracic aorta.  Mediastinum/Nodes: Visualized mediastinum and hilar regions demonstrate no lymphadenopathy or masses.  Lungs/Pleura: Visualized lungs show no evidence of pulmonary edema, consolidation, pneumothorax, nodule or pleural fluid.  Upper Abdomen: No acute abnormality.  Musculoskeletal: No chest wall mass or suspicious bone lesions identified.  IMPRESSION: Mild atherosclerosis of the thoracic aorta.  Electronically Signed: By: Aletta Edouard M.D. On: 10/25/2021 10:48    Recent Labs: 09/10/2021: Hemoglobin 17.9; Platelets 262.0; TSH 1.38 03/16/2022: ALT 19; BUN 19; Creatinine, Ser 0.93; Potassium 4.6; Sodium 139  Recent Lipid Panel    Component Value Date/Time   CHOL 82 03/16/2022 1243   CHOL 113 01/31/2022 0901   TRIG 82.0 03/16/2022 1243   TRIG 213  (HH) 08/28/2006 1631   HDL 31.50 (L) 03/16/2022 1243   HDL 40 01/31/2022 0901   CHOLHDL 3 03/16/2022 1243   VLDL 16.4 03/16/2022 1243   LDLCALC 34 03/16/2022 1243   LDLCALC 58 01/31/2022 0901   LDLCALC 96 03/09/2020 1108   LDLDIRECT 75.0 09/10/2021 0948       Physical Exam:    VS:  BP 130/60   Pulse 92   Ht '5\' 11"'$  (1.803 m)   Wt 261 lb (118.4 kg)   SpO2 96%   BMI  36.40 kg/m     Wt Readings from Last 3 Encounters:  05/30/22 261 lb (118.4 kg)  03/18/22 268 lb (121.6 kg)  10/27/21 254 lb (115.2 kg)    Gen: no distress, morbid obesity   Neck: No JVD Ears: Bilateral Pilar Plate Sign Cardiac: No Rubs or Gallops, systolic murmur, RRR +2 radial pulses Respiratory: Clear to auscultation bilaterally, normal effort, normal  respiratory rate GI: Soft, nontender, non-distended  MS: No  edema;  moves all extremities Integument: Skin feels well Neuro:  At time of evaluation, alert and oriented to person/place/time/situation  Psych: Normal affect, patient feels ok  ASSESSMENT:    1. Chest pain of uncertain etiology   2. Precordial chest pain   3. Pure hypercholesterolemia   4. Hypertension associated with type 2 diabetes mellitus ([place])   5. Morbid obesity ([place])   6. Family history of hypertrophic cardiomyopathy     PLAN:     Chest pain With HLD and aortic atherosclerosis With CAC With DM on SGLT2i Former Smoker - will get exercise NM Stress test - LDL at goal on zetia and statin   Family history (potentially) of HCM AV calcium without AS HTN and morbid obesity - BP is at goal on CCB and ARB - no evidence of LVOT obstruction; his septal hypertrophy could be related to his prior HTN - we have discussed the natural history of HCM, including genetics and family screening (in regard to his sisters, daughter, and granddaughter) - we discussed activity and will make more formal recommendations after stress test and his sisters CMR - will get CMR based on Robin's Study.     Time  Spent Directly with Patient:   I have spent a total of 40 minutes with the patient reviewing notes, imaging, EKGs, labs and examining the patient as well as establishing an assessment and plan that was discussed personally with the patient.  > 50% of time was spent in direct patient careand reviewing imaging with patient.   Medication Adjustments/Labs and Tests Ordered: Current medicines are reviewed at length with the patient today.  Concerns regarding medicines are outlined above.  Orders Placed This Encounter  Procedures   MYOCARDIAL PERFUSION IMAGING   No orders of the defined types were placed in this encounter.   Patient Instructions  Medication Instructions:  Your physician recommends that you continue on your current medications as directed. Please refer to the Current Medication list given to you today.  *If you need a refill on your cardiac medications before your next appointment, please call your pharmacy*   Lab Work: NONe If you have labs (blood work) drawn today and your tests are completely normal, you will receive your results only by: [place] (if you have MyChart) OR A paper copy in the mail If you have any lab test that is abnormal or we need to change your treatment, we will call you to review the results.   Testing/Procedures: Your physician has requested that you have a lexiscan myoview. For further information please visit [email or web address]. Please follow instruction sheet, as given.   You are scheduled for a Myocardial Perfusion Imaging Study. Please arrive 15 minutes prior to your appointment time for registration and insurance purposes.   The test will take approximately 3 to 4 hours to complete; you may bring reading material.  If someone comes with you to your appointment, they will need to remain in the main lobby due to limited space in the testing area.    How to prepare  for your Myocardial Perfusion Test: Do not eat or drink 3 hours prior  to your test, except you may have water. Do not consume products containing caffeine (regular or decaffeinated) 12 hours prior to your test. (ex: coffee, chocolate, sodas, tea). Do bring a list of your current medications with you.  If not listed below, you may take your medications as normal. Do wear comfortable clothes (no dresses or overalls) and walking shoes, tennis shoes preferred (No heels or open toe shoes are allowed). Do NOT wear cologne, perfume, aftershave, or lotions (deodorant is allowed). If these instructions are not followed, your test will have to be rescheduled.  If you cannot keep your appointment, please provide 24 hours notification to the Nuclear Lab, to avoid a possible $50 charge to your account.       Follow-Up: At [facility], you and your health needs are our priority.  As part of our continuing mission to provide you with exceptional heart care, we have created designated Provider Care Teams.  These Care Teams include your primary Cardiologist (physician) and Advanced Practice Providers (APPs -  Physician Assistants and Nurse Practitioners) who all work together to provide you with the care you need, when you need it.  We recommend signing up for the patient portal called "MyChart".  Sign up information is provided on this After Visit Summary.  MyChart is used to connect with patients for Virtual Visits (Telemedicine).  Patients are able to view lab/test results, encounter notes, upcoming appointments, etc.  Non-urgent messages can be sent to your provider as well.   To learn more about what you can do with MyChart, go to [email or web address].    Your next appointment:   4 month(s)  The format for your next appointment:   In Person  Provider:   Werner Lean, MD      Important Information About Sugar         Signed, Werner Lean, MD  05/30/2022 11:58 AM    [place]

## 2022-05-30 ENCOUNTER — Ambulatory Visit: Payer: 59 | Attending: Internal Medicine | Admitting: Internal Medicine

## 2022-05-30 ENCOUNTER — Encounter: Payer: Self-pay | Admitting: Internal Medicine

## 2022-05-30 VITALS — BP 130/60 | HR 92 | Ht 71.0 in | Wt 261.0 lb

## 2022-05-30 DIAGNOSIS — I152 Hypertension secondary to endocrine disorders: Secondary | ICD-10-CM

## 2022-05-30 DIAGNOSIS — R079 Chest pain, unspecified: Secondary | ICD-10-CM

## 2022-05-30 DIAGNOSIS — Z8249 Family history of ischemic heart disease and other diseases of the circulatory system: Secondary | ICD-10-CM

## 2022-05-30 DIAGNOSIS — R072 Precordial pain: Secondary | ICD-10-CM

## 2022-05-30 DIAGNOSIS — E7800 Pure hypercholesterolemia, unspecified: Secondary | ICD-10-CM

## 2022-05-30 DIAGNOSIS — E1159 Type 2 diabetes mellitus with other circulatory complications: Secondary | ICD-10-CM | POA: Diagnosis not present

## 2022-05-30 NOTE — Patient Instructions (Signed)
Medication Instructions:  Your physician recommends that you continue on your current medications as directed. Please refer to the Current Medication list given to you today.  *If you need a refill on your cardiac medications before your next appointment, please call your pharmacy*   Lab Work: NONe If you have labs (blood work) drawn today and your tests are completely normal, you will receive your results only by: [place] (if you have MyChart) OR A paper copy in the mail If you have any lab test that is abnormal or we need to change your treatment, we will call you to review the results.   Testing/Procedures: Your physician has requested that you have a lexiscan myoview. For further information please visit [email or web address]. Please follow instruction sheet, as given.   You are scheduled for a Myocardial Perfusion Imaging Study. Please arrive 15 minutes prior to your appointment time for registration and insurance purposes.   The test will take approximately 3 to 4 hours to complete; you may bring reading material.  If someone comes with you to your appointment, they will need to remain in the main lobby due to limited space in the testing area.    How to prepare for your Myocardial Perfusion Test: Do not eat or drink 3 hours prior to your test, except you may have water. Do not consume products containing caffeine (regular or decaffeinated) 12 hours prior to your test. (ex: coffee, chocolate, sodas, tea). Do bring a list of your current medications with you.  If not listed below, you may take your medications as normal. Do wear comfortable clothes (no dresses or overalls) and walking shoes, tennis shoes preferred (No heels or open toe shoes are allowed). Do NOT wear cologne, perfume, aftershave, or lotions (deodorant is allowed). If these instructions are not followed, your test will have to be rescheduled.  If you cannot keep your appointment, please provide 24 hours  notification to the Nuclear Lab, to avoid a possible $50 charge to your account.       Follow-Up: At [facility], you and your health needs are our priority.  As part of our continuing mission to provide you with exceptional heart care, we have created designated Provider Care Teams.  These Care Teams include your primary Cardiologist (physician) and Advanced Practice Providers (APPs -  Physician Assistants and Nurse Practitioners) who all work together to provide you with the care you need, when you need it.  We recommend signing up for the patient portal called "MyChart".  Sign up information is provided on this After Visit Summary.  MyChart is used to connect with patients for Virtual Visits (Telemedicine).  Patients are able to view lab/test results, encounter notes, upcoming appointments, etc.  Non-urgent messages can be sent to your provider as well.   To learn more about what you can do with MyChart, go to [email or web address].    Your next appointment:   4 month(s)  The format for your next appointment:   In Person  Provider:   Werner Lean, MD      Important Information About Sugar

## 2022-06-11 ENCOUNTER — Telehealth: Payer: 59 | Admitting: Family

## 2022-06-11 DIAGNOSIS — M545 Low back pain, unspecified: Secondary | ICD-10-CM | POA: Diagnosis not present

## 2022-06-11 MED ORDER — NAPROXEN 500 MG PO TABS: 500.0000 mg | ORAL_TABLET | Freq: Two times a day (BID) | ORAL | 0 refills | Status: DC

## 2022-06-11 NOTE — Progress Notes (Signed)
We are sorry that you are not feeling well.  Here is how we plan to help!  Based on what you have shared with me it looks like you mostly have acute back pain.  Acute back pain is defined as musculoskeletal pain that can resolve in 1-3 weeks with conservative treatment.  I have prescribed Naprosyn 500 mg take one by mouth twice a day non-steroid anti-inflammatory (NSAID).  I can not prescribe a muscle relaxer since you are already taking soma. Some patients experience stomach irritation or in increased heartburn with anti-inflammatory drugs.  Please keep in mind that muscle relaxer's can cause fatigue and should not be taken while at work or driving.  Back pain is very common.  The pain often gets better over time.  The cause of back pain is usually not dangerous.  Most people can learn to manage their back pain on their own.  Home Care Stay active.  Start with short walks on flat ground if you can.  Try to walk farther each day. Do not sit, drive or stand in one place for more than 30 minutes.  Do not stay in bed. Do not avoid exercise or work.  Activity can help your back heal faster. Be careful when you bend or lift an object.  Bend at your knees, keep the object close to you, and do not twist. Sleep on a firm mattress.  Lie on your side, and bend your knees.  If you lie on your back, put a pillow under your knees. Only take medicines as told by your doctor. Put ice on the injured area. Put ice in a plastic bag Place a towel between your skin and the bag Leave the ice on for 15-20 minutes, 3-4 times a day for the first 2-3 days. 210 After that, you can switch between ice and heat packs. Ask your doctor about back exercises or massage. Avoid feeling anxious or stressed.  Find good ways to deal with stress, such as exercise.  Get Help Right Way If: Your pain does not go away with rest or medicine. Your pain does not go away in 1 week. You have new problems. You do not feel well. The pain  spreads into your legs. You cannot control when you poop (bowel movement) or pee (urinate) You feel sick to your stomach (nauseous) or throw up (vomit) You have belly (abdominal) pain. You feel like you may pass out (faint). If you develop a fever.  Make Sure you: Understand these instructions. Will watch your condition Will get help right away if you are not doing well or get worse.  Your e-visit answers were reviewed by a board certified advanced clinical practitioner to complete your personal care plan.  Depending on the condition, your plan could have included both over the counter or prescription medications.  If there is a problem please reply  once you have received a response from your provider.  Your safety is important to [place].  If you have drug allergies check your prescription carefully.    You can use MyChart to ask questions about today's visit, request a non-urgent call back, or ask for a work or school excuse for 24 hours related to this e-Visit. If it has been greater than 24 hours you will need to follow up with your provider, or enter a new e-Visit to address those concerns.  You will get an e-mail in the next two days asking about your experience.  I hope that your e-visit has  been valuable and will speed your recovery. Thank you for using e-visits.  .Approximately 5 minutes was spent documenting and reviewing patient's chart.

## 2022-06-13 ENCOUNTER — Encounter: Payer: Self-pay | Admitting: Internal Medicine

## 2022-06-16 NOTE — Telephone Encounter (Signed)
Please advise as you do not have any openings but I will check to see if any other providers do.

## 2022-06-19 ENCOUNTER — Other Ambulatory Visit: Payer: Self-pay | Admitting: Internal Medicine

## 2022-07-04 ENCOUNTER — Telehealth (HOSPITAL_COMMUNITY): Payer: Self-pay | Admitting: Radiology

## 2022-07-04 NOTE — Telephone Encounter (Signed)
Patient given detailed instructions per Myocardial Perfusion Study Information Sheet for the test on 07/05/2022 at 10:30. Patient notified to arrive 15 minutes early and that it is imperative to arrive on time for appointment to keep from having the test rescheduled.  If you need to cancel or reschedule your appointment, please call the office within 24 hours of your appointment. . Patient verbalized understanding.EHK

## 2022-07-05 ENCOUNTER — Ambulatory Visit (HOSPITAL_COMMUNITY): Payer: 59 | Attending: Internal Medicine

## 2022-07-05 DIAGNOSIS — R079 Chest pain, unspecified: Secondary | ICD-10-CM | POA: Insufficient documentation

## 2022-07-05 LAB — MYOCARDIAL PERFUSION IMAGING
LV dias vol: 111 mL (ref 62–150)
LV sys vol: 40 mL
Nuc Stress EF: 64 %
Peak HR: 96 {beats}/min
Rest HR: 72 {beats}/min
Rest Nuclear Isotope Dose: 9.5 mCi
SDS: 0
SRS: 0
SSS: 0
ST Depression (mm): 0 mm
Stress Nuclear Isotope Dose: 32.8 mCi
TID: 1.02

## 2022-07-05 MED ORDER — TECHNETIUM TC 99M TETROFOSMIN IV KIT
9.5000 | PACK | Freq: Once | INTRAVENOUS | Status: AC | PRN
  Administered 2022-07-05: 9.5 via INTRAVENOUS

## 2022-07-05 MED ORDER — REGADENOSON 0.4 MG/5ML IV SOLN
0.4000 mg | Freq: Once | INTRAVENOUS | Status: AC
  Administered 2022-07-05: 0.4 mg via INTRAVENOUS

## 2022-07-05 MED ORDER — TECHNETIUM TC 99M TETROFOSMIN IV KIT
32.8000 | PACK | Freq: Once | INTRAVENOUS | Status: AC | PRN
  Administered 2022-07-05: 32.8 via INTRAVENOUS

## 2022-07-28 ENCOUNTER — Other Ambulatory Visit: Payer: Self-pay | Admitting: Family

## 2022-08-11 ENCOUNTER — Other Ambulatory Visit: Payer: Self-pay | Admitting: Internal Medicine

## 2022-08-11 NOTE — Telephone Encounter (Signed)
Please refill as per office routine med refill policy (all routine meds to be refilled for 3 mo or monthly (per pt preference) up to one year from last visit, then month to month grace period for 3 mo, then further med refills will have to be denied) ? ?

## 2022-08-22 ENCOUNTER — Other Ambulatory Visit: Payer: Self-pay | Admitting: Internal Medicine

## 2022-09-03 ENCOUNTER — Other Ambulatory Visit: Payer: Self-pay | Admitting: Internal Medicine

## 2022-09-03 NOTE — Telephone Encounter (Signed)
Please refill as per office routine med refill policy (all routine meds to be refilled for 3 mo or monthly (per pt preference) up to one year from last visit, then month to month grace period for 3 mo, then further med refills will have to be denied)

## 2022-09-05 ENCOUNTER — Other Ambulatory Visit: Payer: Self-pay

## 2022-09-15 ENCOUNTER — Other Ambulatory Visit (INDEPENDENT_AMBULATORY_CARE_PROVIDER_SITE_OTHER): Payer: 59

## 2022-09-15 DIAGNOSIS — E538 Deficiency of other specified B group vitamins: Secondary | ICD-10-CM | POA: Diagnosis not present

## 2022-09-15 DIAGNOSIS — Z125 Encounter for screening for malignant neoplasm of prostate: Secondary | ICD-10-CM

## 2022-09-15 DIAGNOSIS — E559 Vitamin D deficiency, unspecified: Secondary | ICD-10-CM

## 2022-09-15 DIAGNOSIS — E1165 Type 2 diabetes mellitus with hyperglycemia: Secondary | ICD-10-CM

## 2022-09-15 LAB — HEPATIC FUNCTION PANEL
ALT: 15 U/L (ref 0–53)
AST: 11 U/L (ref 0–37)
Albumin: 4.3 g/dL (ref 3.5–5.2)
Alkaline Phosphatase: 52 U/L (ref 39–117)
Bilirubin, Direct: 0.1 mg/dL (ref 0.0–0.3)
Total Bilirubin: 0.5 mg/dL (ref 0.2–1.2)
Total Protein: 6.1 g/dL (ref 6.0–8.3)

## 2022-09-15 LAB — CBC WITH DIFFERENTIAL/PLATELET
Basophils Absolute: 0.1 10*3/uL (ref 0.0–0.1)
Basophils Relative: 1.1 % (ref 0.0–3.0)
Eosinophils Absolute: 0.2 10*3/uL (ref 0.0–0.7)
Eosinophils Relative: 3.6 % (ref 0.0–5.0)
HCT: 49 % (ref 39.0–52.0)
Hemoglobin: 16.6 g/dL (ref 13.0–17.0)
Lymphocytes Relative: 31.3 % (ref 12.0–46.0)
Lymphs Abs: 1.5 10*3/uL (ref 0.7–4.0)
MCHC: 33.8 g/dL (ref 30.0–36.0)
MCV: 92.4 fl (ref 78.0–100.0)
Monocytes Absolute: 0.5 10*3/uL (ref 0.1–1.0)
Monocytes Relative: 11 % (ref 3.0–12.0)
Neutro Abs: 2.5 10*3/uL (ref 1.4–7.7)
Neutrophils Relative %: 53 % (ref 43.0–77.0)
Platelets: 227 10*3/uL (ref 150.0–400.0)
RBC: 5.3 Mil/uL (ref 4.22–5.81)
RDW: 13.2 % (ref 11.5–15.5)
WBC: 4.7 10*3/uL (ref 4.0–10.5)

## 2022-09-15 LAB — BASIC METABOLIC PANEL
BUN: 16 mg/dL (ref 6–23)
CO2: 27 mEq/L (ref 19–32)
Calcium: 8.8 mg/dL (ref 8.4–10.5)
Chloride: 107 mEq/L (ref 96–112)
Creatinine, Ser: 0.83 mg/dL (ref 0.40–1.50)
GFR: 96.41 mL/min (ref >60.00)
Glucose, Bld: 136 mg/dL — ABNORMAL HIGH (ref 70–99)
Potassium: 4.2 mEq/L (ref 3.5–5.1)
Sodium: 142 mEq/L (ref 135–145)

## 2022-09-15 LAB — MICROALBUMIN / CREATININE URINE RATIO
Creatinine,U: 94.5 mg/dL
Microalb Creat Ratio: 0.7 mg/g (ref 0.0–30.0)
Microalb, Ur: <0.7 mg/dL (ref 0.0–1.9)

## 2022-09-15 LAB — URINALYSIS, ROUTINE W REFLEX MICROSCOPIC
Bilirubin Urine: NEGATIVE
Hgb urine dipstick: NEGATIVE
Ketones, ur: NEGATIVE
Leukocytes,Ua: NEGATIVE
Nitrite: NEGATIVE
RBC / HPF: NONE SEEN (ref 0–?)
Specific Gravity, Urine: 1.02 (ref 1.000–1.030)
Total Protein, Urine: NEGATIVE
Urine Glucose: >=1000 — AB
Urobilinogen, UA: 0.2 (ref 0.0–1.0)
pH: 6.5 (ref 5.0–8.0)

## 2022-09-15 LAB — LIPID PANEL
Cholesterol: 87 mg/dL (ref 0–200)
HDL: 31.9 mg/dL — ABNORMAL LOW (ref >39.00)
LDL Cholesterol: 40 mg/dL (ref 0–99)
NonHDL: 55.09
Total CHOL/HDL Ratio: 3
Triglycerides: 73 mg/dL (ref 0.0–149.0)
VLDL: 14.6 mg/dL (ref 0.0–40.0)

## 2022-09-15 LAB — TSH: TSH: 1.48 u[IU]/mL (ref 0.35–5.50)

## 2022-09-15 LAB — VITAMIN B12: Vitamin B-12: 477 pg/mL (ref 211–911)

## 2022-09-15 LAB — HEMOGLOBIN A1C: Hgb A1c MFr Bld: 6.8 % — ABNORMAL HIGH (ref 4.6–6.5)

## 2022-09-15 LAB — PSA: PSA: 0.55 ng/mL (ref 0.10–4.00)

## 2022-09-15 LAB — VITAMIN D 25 HYDROXY (VIT D DEFICIENCY, FRACTURES): VITD: 24.87 ng/mL — ABNORMAL LOW (ref 30.00–100.00)

## 2022-09-19 ENCOUNTER — Ambulatory Visit: Payer: 59 | Admitting: Internal Medicine

## 2022-09-19 VITALS — BP 130/76 | HR 77 | Temp 98.4°F | Ht 71.0 in | Wt 266.0 lb

## 2022-09-19 DIAGNOSIS — Z0001 Encounter for general adult medical examination with abnormal findings: Secondary | ICD-10-CM | POA: Diagnosis not present

## 2022-09-19 DIAGNOSIS — E1165 Type 2 diabetes mellitus with hyperglycemia: Secondary | ICD-10-CM

## 2022-09-19 DIAGNOSIS — E559 Vitamin D deficiency, unspecified: Secondary | ICD-10-CM

## 2022-09-19 DIAGNOSIS — M48061 Spinal stenosis, lumbar region without neurogenic claudication: Secondary | ICD-10-CM | POA: Diagnosis not present

## 2022-09-19 DIAGNOSIS — F17200 Nicotine dependence, unspecified, uncomplicated: Secondary | ICD-10-CM

## 2022-09-19 DIAGNOSIS — E1159 Type 2 diabetes mellitus with other circulatory complications: Secondary | ICD-10-CM

## 2022-09-19 DIAGNOSIS — G8929 Other chronic pain: Secondary | ICD-10-CM

## 2022-09-19 DIAGNOSIS — E7800 Pure hypercholesterolemia, unspecified: Secondary | ICD-10-CM

## 2022-09-19 DIAGNOSIS — M5441 Lumbago with sciatica, right side: Secondary | ICD-10-CM

## 2022-09-19 DIAGNOSIS — I152 Hypertension secondary to endocrine disorders: Secondary | ICD-10-CM

## 2022-09-19 MED ORDER — RYBELSUS 14 MG PO TABS: 14.0000 mg | ORAL_TABLET | Freq: Every day | ORAL | 3 refills | Status: DC

## 2022-09-19 NOTE — Progress Notes (Unsigned)
Patient ID: Jeffrey [name], male   DOB: August 24, 1965, 57 y.o.   MRN: [id number]         Chief Complaint:: wellness exam and 61mofollow up (Still having lower back pain)  , low vit d, dm, htn, hld       HPI:  Jeffrey FRIEDENis a 57y.o. male here for wellness exam; up to date                        Also Pt denies chest pain, increased sob or doe, wheezing, orthopnea, PND, increased LE swelling, palpitations, dizziness or syncope.   Pt denies polydipsia, polyuria, or new focal neuro s/s.    Pt denies fever, wt loss, night sweats, loss of appetite, or other constitutional symptoms  .Pt continues to have recurring LBP without change in severity, bowel or bladder change, fever, wt loss,  worsening LE pain/numbness/weakness, gait change or falls, conts to fallow with Dr CEarlean Polkawith lumbar spinal stenosis.  Takiing 4000 u vit d daily   Wt Readings from Last 3 Encounters:  09/19/22 266 lb (120.7 kg)  07/05/22 261 lb (118.4 kg)  05/30/22 261 lb (118.4 kg)   BP Readings from Last 3 Encounters:  09/19/22 130/76  05/30/22 130/60  03/18/22 126/70   Immunization History  Administered Date(s) Administered   H1N1 09/25/2008   Influenza Inj Mdck Quad Pf 05/25/2017, 05/12/2018, 04/25/2019, 04/28/2022   Influenza Split 07/04/2011   Influenza Whole 09/24/2009   Influenza,inj,Quad PF,6+ Mos 05/24/2013, 08/18/2015, 05/07/2020   Influenza-Unspecified 06/15/2016, 05/25/2021   PFIZER Comirnaty(Gray Top)Covid-19 Tri-Sucrose Vaccine 12/18/2020   PFIZER(Purple Top)SARS-COV-2 Vaccination 11/08/2019, 12/03/2019, 07/02/2020   Pneumococcal Conjugate-13 08/31/2015   Pneumococcal Polysaccharide-23 08/23/2016   Td 09/25/2008   Tdap 03/01/2019   Zoster Recombinat (Shingrix) 03/15/2021, 09/13/2021   There are no preventive care reminders to display for this patient.     Past Medical History:  Diagnosis Date   Allergy    ANXIETY 07/05/2007   Arthritis 08/2015   neck and back   Cancer ([place]    basal cell    CARPAL TUNNEL SYNDROME, BILATERAL 07/05/2007   Cervicalgia 03/18/2008   DEPRESSION 07/05/2007   DIABETES MELLITUS, TYPE II 07/05/2007   ERECTILE DYSFUNCTION 07/11/2007   GERD 07/11/2007   HYPERLIPIDEMIA 07/05/2007   HYPERTENSION 07/05/2007   LOW BACK PAIN 07/11/2007   Meralgia paresthetica 09/25/2008   PEPTIC ULCER DISEASE 07/05/2007   SINUSITIS- ACUTE-NOS 03/26/2009   SKIN LESION 03/18/2008   Past Surgical History:  Procedure Laterality Date   ANTERIOR CERVICAL DECOMP/DISCECTOMY FUSION N/A 01/16/2020   Procedure: Cervical 4-5 Cervical 5-6 Cervical 6-7 Anterior cervical decompression/discectomy/fusion;  Surgeon: SErline Levine MD;  Location[facility]  Service: Neurosurgery;  Laterality: N/A;  3C   BASAL CELL CARCINOMA EXCISION Right 2011   shoulder area   CARPAL TUNNEL RELEASE Left 01/16/2020   Procedure: LEFT CARPAL TUNNEL RELEASE;  Surgeon: SErline Levine MD;  [facility]  Service: Neurosurgery;  Laterality: Left;  3C   CARPAL TUNNEL RELEASE Right 02/25/2020   Procedure: RIGHT CARPAL TUNNEL RELEASE;  Surgeon: SErline Levine MD;  Location[facility]  Service: Neurosurgery;  Laterality: Right;   FRACTURE SURGERY     left wrist surgury/fracture  1985   SHOULDER ARTHROSCOPY WITH SUBACROMIAL DECOMPRESSION Left 03/30/2017   Procedure: SHOULDER ARTHROSCOPY WITH DEBRIDEMENT ROTATOR CUFF,SUBACROMIAL DECOMPRESSION AND OPEN TENODESIS;  Surgeon: CTania Ade MD;  Location[facility]  Service: Orthopedics;  Laterality: Left;  SHOULDER ARTHROSCOPY WITH DEBRIDEMENT  ROTATOR CUFF,SUBACROMIAL DECOMPRESSION AND POSSIBLE OPEN TENODESIS   WISDOM TOOTH EXTRACTION      reports that he quit smoking about 8 years ago. His smoking use included cigars. He has quit using smokeless tobacco. He reports current alcohol use. He reports that he does not use drugs. family history includes Cancer in his maternal grandfather; Diabetes in his father, mother, and another family member; Heart disease in his maternal grandmother;  Hypertension in his father, mother, and another family member. Allergies  Allergen Reactions   Codeine Itching and Other (See Comments)   Current Outpatient Medications on File Prior to Visit  Medication Sig Dispense Refill   ALPRAZolam (XANAX) 1 MG tablet TAKE 1 TABLET BY MOUTH TWICE A DAY AS NEEDED 60 tablet 2   amLODipine (NORVASC) 10 MG tablet Take 1 tablet (10 mg total) by mouth daily. 90 tablet 3   aspirin EC 81 MG tablet Take 81 mg by mouth every evening.     atorvastatin (LIPITOR) 80 MG tablet TAKE 1 TABLET BY MOUTH EVERY DAY 90 tablet 2   carisoprodol (SOMA) 350 MG tablet TAKE 1 TABLET BY MOUTH 3 TIMES A DAY AS NEEDED AS DIRECTED 90 tablet 5   Cholecalciferol 50 MCG (2000 UT) TABS 1 tab by mouth once daily 30 tablet 99   ezetimibe (ZETIA) 10 MG tablet Take 1 tablet (10 mg total) by mouth daily. 90 tablet 3   FARXIGA 10 MG TABS tablet TAKE 1 TABLET BY MOUTH EVERY DAY BEFORE BREAKFAST 90 tablet 3   gabapentin (NEURONTIN) 300 MG capsule TAKE 1 CAPSULE BY MOUTH EVERYDAY AT BEDTIME 90 capsule 1   glipiZIDE (GLUCOTROL XL) 10 MG 24 hr tablet TAKE 1 TABLET (10 MG TOTAL) BY MOUTH DAILY WITH BREAKFAST. 90 tablet 3   losartan (COZAAR) 100 MG tablet TAKE 1 TABLET BY MOUTH EVERY DAY 90 tablet 3   metFORMIN (GLUCOPHAGE) 500 MG tablet TAKE 2 TABLETS BY MOUTH EVERY MORNING AND TAKE 2 TABLETS BY MOUTH EVERY EVENING 360 tablet 3   naproxen (NAPROSYN) 500 MG tablet Take 1 tablet (500 mg total) by mouth 2 (two) times daily with a meal. 30 tablet 0   pioglitazone (ACTOS) 45 MG tablet TAKE 1 TABLET BY MOUTH EVERY DAY 90 tablet 3   triamcinolone (NASACORT) 55 MCG/ACT AERO nasal inhaler Place 2 sprays into the nose daily. 3 each 3   No current facility-administered medications on file prior to visit.        ROS:  All others reviewed and negative.  Objective        PE:  BP 130/76 (BP Location: Right Arm, Patient Position: Sitting, Cuff Size: Large)   Pulse 77   Temp 98.4 F (36.9 C) (Oral)   Ht 5'  11" (1.803 m)   Wt 266 lb (120.7 kg)   SpO2 95%   BMI 37.10 kg/m                 Constitutional: Pt appears in NAD               HENT: Head: NCAT.                Right Ear: External ear normal.                 Left Ear: External ear normal.                Eyes: . Pupils are equal, round, and reactive to light. Conjunctivae and EOM are normal  Nose: without d/c or deformity               Neck: Neck supple. Gross normal ROM               Cardiovascular: Normal rate and regular rhythm.                 Pulmonary/Chest: Effort normal and breath sounds without rales or wheezing.                Abd:  Soft, NT, ND, + BS, no organomegaly               Neurological: Pt is alert. At baseline orientation, motor grossly intact               Skin: Skin is warm. No rashes, no other new lesions, LE edema - none               Psychiatric: Pt behavior is normal without agitation   Micro: none  Cardiac tracings I have personally interpreted today:  none  Pertinent Radiological findings (summarize): none   Lab Results  Component Value Date   WBC 4.7 09/15/2022   HGB 16.6 09/15/2022   HCT 49.0 09/15/2022   PLT 227.0 09/15/2022   GLUCOSE 136 (H) 09/15/2022   CHOL 87 09/15/2022   TRIG 73.0 09/15/2022   HDL 31.90 (L) 09/15/2022   LDLDIRECT 75.0 09/10/2021   LDLCALC 40 09/15/2022   ALT 15 09/15/2022   AST 11 09/15/2022   NA 142 09/15/2022   K 4.2 09/15/2022   CL 107 09/15/2022   CREATININE 0.83 09/15/2022   BUN 16 09/15/2022   CO2 27 09/15/2022   TSH 1.48 09/15/2022   PSA 0.55 09/15/2022   HGBA1C 6.8 (H) 09/15/2022   MICROALBUR <0.7 09/15/2022   Assessment/Plan:  Jeffrey [name] is a 57 y.o. White or Caucasian [1] male with  has a past medical history of Allergy, ANXIETY (07/05/2007), Arthritis (08/2015), Cancer ([place]), CARPAL TUNNEL SYNDROME, BILATERAL (07/05/2007), Cervicalgia (03/18/2008), DEPRESSION (07/05/2007), DIABETES MELLITUS, TYPE II (07/05/2007), ERECTILE DYSFUNCTION  (07/11/2007), GERD (07/11/2007), HYPERLIPIDEMIA (07/05/2007), HYPERTENSION (07/05/2007), LOW BACK PAIN (07/11/2007), Meralgia paresthetica (09/25/2008), PEPTIC ULCER DISEASE (07/05/2007), SINUSITIS- ACUTE-NOS (03/26/2009), and SKIN LESION (03/18/2008).  Encounter for well adult exam with abnormal findings Age and sex appropriate education and counseling updated with regular exercise and diet Referrals for preventative services - none needed Immunizations addressed - none needed Smoking counseling  - counsled to quit, pt not ready Evidence for depression or other mood disorder - none significant Most recent labs reviewed. I have personally reviewed and have noted: 1) the patient's medical and social history 2) The patient's current medications and supplements 3) The patient's height, weight, and BMI have been recorded in the chart   Vitamin D deficiency Last vitamin D Lab Results  Component Value Date   VD25OH 24.87 (L) 09/15/2022   Low, for increased oral replacement Vit D3 to 6000 u qd  Smoker Pt counsled to quit, pt not ready  Hypertension associated with type 2 diabetes mellitus ([place]) BP Readings from Last 3 Encounters:  09/19/22 130/76  05/30/22 130/60  03/18/22 126/70   Stable, pt to continue medical treatment norvasc 10 mg qd, losartan 100 qd   Hyperlipidemia Lab Results  Component Value Date   LDLCALC 40 09/15/2022   Stable, pt to continue current statin lipitor 80 mg qd, zetia 10 qd   Diabetes ([place]) Lab Results  Component Value Date   HGBA1C 6.8 (H) 09/15/2022  Stable, pt to continue current medical treatment fraxiga 10 qd, glipizide 10 qd, metformin 1000 bid, actos 45 qd, and increase the rybelsus to 14 mg to assist with wt loss as well   LOW BACK PAIN Chronic persistent, to f/u Dr Luther Parody as planned  Followup: Return in about 6 months (around 03/20/2023).  Cathlean Cower, MD 09/20/2022 8:48 PM [facility]

## 2022-09-19 NOTE — Patient Instructions (Signed)
Ok to increase the rybelsus to 14 mg per day  Ok to increase the Vit D to 6000 u per day  Please continue all other medications as before, and refills have been done if requested.  Please have the pharmacy call with any other refills you may need.  Please continue your efforts at being more active, low cholesterol diet, and weight control.  You are otherwise up to date with prevention measures today.  Please keep your appointments with your specialists as you may have planned  You will be contacted regarding the referral for: Neurosurgury  Please make an Appointment to return in 6 months, or sooner if needed, also with Lab Appointment for testing done 3-5 days before at the [facility] (so this is for TWO appointments - please see the scheduling desk as you leave)

## 2022-09-20 ENCOUNTER — Encounter: Payer: Self-pay | Admitting: Internal Medicine

## 2022-09-20 NOTE — Assessment & Plan Note (Signed)
Pt counsled to quit, pt not ready °

## 2022-09-20 NOTE — Assessment & Plan Note (Addendum)
Age and sex appropriate education and counseling updated with regular exercise and diet Referrals for preventative services - none needed Immunizations addressed - none needed Smoking counseling  - counsled to quit, pt not ready Evidence for depression or other mood disorder - none significant Most recent labs reviewed. I have personally reviewed and have noted: 1) the patient's medical and social history 2) The patient's current medications and supplements 3) The patient's height, weight, and BMI have been recorded in the chart

## 2022-09-20 NOTE — Assessment & Plan Note (Signed)
Lab Results  Component Value Date   LDLCALC 40 09/15/2022   Stable, pt to continue current statin lipitor 80 mg qd, zetia 10 qd

## 2022-09-20 NOTE — Assessment & Plan Note (Signed)
BP Readings from Last 3 Encounters:  09/19/22 130/76  05/30/22 130/60  03/18/22 126/70   Stable, pt to continue medical treatment norvasc 10 mg qd, losartan 100 qd

## 2022-09-20 NOTE — Assessment & Plan Note (Signed)
Chronic persistent, to f/u Dr Luther Parody as planned

## 2022-09-20 NOTE — Assessment & Plan Note (Signed)
Lab Results  Component Value Date   HGBA1C 6.8 (H) 09/15/2022   Stable, pt to continue current medical treatment fraxiga 10 qd, glipizide 10 qd, metformin 1000 bid, actos 45 qd, and increase the rybelsus to 14 mg to assist with wt loss as well

## 2022-09-20 NOTE — Assessment & Plan Note (Signed)
Last vitamin D Lab Results  Component Value Date   VD25OH 24.87 (L) 09/15/2022   Low, for increased oral replacement Vit D3 to 6000 u qd

## 2022-10-06 NOTE — Progress Notes (Signed)
Cardiology Office Note:    Date:  10/07/2022   ID:  Jeffrey [name], DOB Dec 29, 1965, Jeffrey [name]  PCP:  Jeffrey Borg, MD   [facility] Providers Cardiologist:  Jeffrey Lean, MD     Referring MD: Jeffrey Borg, MD   CC: HCM Discussions- follow up testing  History of Present Illness:    Jeffrey [name] is a 57 y.o. male with a hx of HTN, HLD elevated CAC, Aortic Valve calcification  and DM, active smoking who presents for evaluation.  2023: He has two sisters.  He had an echo that showed a septal thickness of 13 mm.  His sister has a septal thickness nearing 15 mm.  Stopped smoking after our last visit.  Had negative stress test. 2024: his sisters MRI did not meet diagnostic criteria for HCM.  We did not elect to test [name] at this time.  Patient notes that he is doing well.   Family and friends have had some infections issues; he doesn't. There are no interval hospital/ED visit.    No chest pain or pressure .  No SOB/DOE and no PND/Orthopnea.  No weight gain or leg swelling.  No palpitations or syncope.  Just started Rybelsus.   Has noted some fatigue.   Was tested for Sleep Apnea with Dr. Jenny [name] and was negative.  Ambulatory blood pressure not done.  He has one Health visitor. She has kids.   Past Medical History:  Diagnosis Date   Allergy    ANXIETY 07/05/2007   Arthritis 08/2015   neck and back   Cancer ([place])    basal cell   CARPAL TUNNEL SYNDROME, BILATERAL 07/05/2007   Cervicalgia 03/18/2008   DEPRESSION 07/05/2007   DIABETES MELLITUS, TYPE II 07/05/2007   ERECTILE DYSFUNCTION 07/11/2007   GERD 07/11/2007   HYPERLIPIDEMIA 07/05/2007   HYPERTENSION 07/05/2007   LOW BACK PAIN 07/11/2007   Meralgia paresthetica 09/25/2008   PEPTIC ULCER DISEASE 07/05/2007   SINUSITIS- ACUTE-NOS 03/26/2009   SKIN LESION 03/18/2008    Past Surgical History:  Procedure Laterality Date   ANTERIOR CERVICAL DECOMP/DISCECTOMY FUSION N/A 01/16/2020   Procedure:  Cervical 4-5 Cervical 5-6 Cervical 6-7 Anterior cervical decompression/discectomy/fusion;  Surgeon: Jeffrey Levine, MD;  Location: [facility];  Service: Neurosurgery;  Laterality: N/A;  3C   BASAL CELL CARCINOMA EXCISION Right 2011   shoulder area   CARPAL TUNNEL RELEASE Left 01/16/2020   Procedure: LEFT CARPAL TUNNEL RELEASE;  Surgeon: Jeffrey Levine, MD;  Location: [facility];  Service: Neurosurgery;  Laterality: Left;  3C   CARPAL TUNNEL RELEASE Right 02/25/2020   Procedure: RIGHT CARPAL TUNNEL RELEASE;  Surgeon: Jeffrey Levine, MD;  Location: [facility];  Service: Neurosurgery;  Laterality: Right;   FRACTURE SURGERY     left wrist surgury/fracture  1985   SHOULDER ARTHROSCOPY WITH SUBACROMIAL DECOMPRESSION Left 03/30/2017   Procedure: SHOULDER ARTHROSCOPY WITH DEBRIDEMENT ROTATOR CUFF,SUBACROMIAL DECOMPRESSION AND OPEN TENODESIS;  Surgeon: Jeffrey Ade, MD;  Location: [facility];  Service: Orthopedics;  Laterality: Left;  SHOULDER ARTHROSCOPY WITH DEBRIDEMENT ROTATOR CUFF,SUBACROMIAL DECOMPRESSION AND POSSIBLE OPEN TENODESIS   WISDOM TOOTH EXTRACTION      Current Medications: Current Meds  Medication Sig   ALPRAZolam (XANAX) 1 MG tablet TAKE 1 TABLET BY MOUTH TWICE A DAY AS NEEDED   amLODipine (NORVASC) 10 MG tablet Take 1 tablet (10 mg total) by mouth daily.   aspirin EC 81 MG tablet Take 81 mg by mouth every evening.   atorvastatin (LIPITOR) 80 MG tablet  TAKE 1 TABLET BY MOUTH EVERY DAY   carisoprodol (SOMA) 350 MG tablet TAKE 1 TABLET BY MOUTH 3 TIMES A DAY AS NEEDED AS DIRECTED   Cholecalciferol 50 MCG (2000 UT) TABS 1 tab by mouth once daily   ezetimibe (ZETIA) 10 MG tablet Take 1 tablet (10 mg total) by mouth daily.   FARXIGA 10 MG TABS tablet TAKE 1 TABLET BY MOUTH EVERY DAY BEFORE BREAKFAST   gabapentin (NEURONTIN) 300 MG capsule TAKE 1 CAPSULE BY MOUTH EVERYDAY AT BEDTIME   glipiZIDE (GLUCOTROL XL) 10 MG 24 hr tablet TAKE 1 TABLET (10 MG TOTAL) BY MOUTH DAILY WITH BREAKFAST.   losartan (COZAAR) 100  MG tablet TAKE 1 TABLET BY MOUTH EVERY DAY   metFORMIN (GLUCOPHAGE) 500 MG tablet TAKE 2 TABLETS BY MOUTH EVERY MORNING AND TAKE 2 TABLETS BY MOUTH EVERY EVENING   naproxen (NAPROSYN) 500 MG tablet Take 1 tablet (500 mg total) by mouth 2 (two) times daily with a meal.   pioglitazone (ACTOS) 45 MG tablet TAKE 1 TABLET BY MOUTH EVERY DAY   Semaglutide (RYBELSUS) 14 MG TABS Take 1 tablet (14 mg total) by mouth daily.   triamcinolone (NASACORT) 55 MCG/ACT AERO nasal inhaler Place 2 sprays into the nose daily.     Allergies:   Codeine   Social History   Socioeconomic History   Marital status: Married    Spouse name: Not on file   Number of children: 2   Years of education: Not on file   Highest education level: Not on file  Occupational History   Occupation: Librarian, academic for [employer]  Tobacco Use   Smoking status: Former    Types: Cigars    Quit date: 10/31/2013    Years since quitting: 8.9   Smokeless tobacco: Former   Tobacco comments:    3 cigars/ day  Vaping Use   Vaping Use: Former  Substance and Sexual Activity   Alcohol use: Yes    Alcohol/week: 0.0 standard drinks of alcohol    Comment: social; weekends 6 beers   Drug use: No   Sexual activity: Not on file  Other Topics Concern   Not on file  Social History Narrative   Not on file   Social Determinants of Health   Financial Resource Strain: Not on file  Food Insecurity: Not on file  Transportation Needs: Not on file  Physical Activity: Not on file  Stress: Not on file  Social Connections: Not on file    Social: Works shipping, maintenance, and Civil engineer, contracting and four grandchildren, married 40  Has three sisters, one biological daugther Father had MI 2023 and had HF and ESRD, passed away  Family History: The patient's family history includes Cancer in his maternal grandfather; Diabetes in his father, mother, and another family member; Heart disease in his maternal grandmother;  Hypertension in his father, mother, and another family member. There is no history of Colon cancer.  ROS:   Please see the history of present illness.     All other systems reviewed and are negative.  EKGs/Labs/Other Studies Reviewed:    The following studies were reviewed today:  EKG:   10/27/21: SR rate 86   Cardiac Studies & Procedures     STRESS TESTS  MYOCARDIAL PERFUSION IMAGING 07/05/2022  Narrative   No ST deviation was noted. The ECG was not diagnostic due to pharmacologic protocol.   A pharmacological stress test was performed using IV Lexiscan 0.'4mg'$  over 10 seconds performed without concurrent  submaximal exercise. Normal blood pressure and normal heart rate response noted during stress.   Left ventricular function is normal. Nuclear stress EF: 64 %. The left ventricular ejection fraction is normal (55-65%). End diastolic cavity size is normal. End systolic cavity size is normal.   The study is normal. The study is low risk.   Prior study not available for comparison.   ECHOCARDIOGRAM  ECHOCARDIOGRAM COMPLETE 11/15/2021  Narrative ECHOCARDIOGRAM REPORT    Patient Name:   Jeffrey [name] Date of Exam: 11/15/2021 Medical Rec #:  [name]         Height:       71.0 in Accession #:    [id number]        Weight:       254.0 lb Date of Birth:  04/25/1966         BSA:          2.334 m Patient Age:    21 years          BP:           157/88 mmHg Patient Gender: M                 HR:           78 bpm. Exam Location:  [facility]  Procedure: 2D Echo, 3D Echo, Cardiac Doppler, Color Doppler and Strain Analysis  Indications:    R01.1 Murmur  History:        Patient has no prior history of Echocardiogram examinations. Signs/Symptoms:Murmur; Risk Factors:Hypertension, Dyslipidemia, Diabetes, Current Smoker and Morbid obesity.  Sonographer:    Basilia Jumbo [facility], RDCS Referring Phys: [id number] [facility] A Shirley Bolle  IMPRESSIONS   1. Left ventricular ejection fraction, by  estimation, is 60 to 65%. Left ventricular ejection fraction by 3D volume is 59 %. The left ventricle has normal function. The left ventricle has no regional wall motion abnormalities. There is mild left ventricular hypertrophy of the basal-septal segment. Left ventricular diastolic parameters were normal. The average left ventricular global longitudinal strain is -22.3 %. The global longitudinal strain is normal. 2. Right ventricular systolic function is normal. The right ventricular size is mildly enlarged. Tricuspid regurgitation signal is inadequate for assessing PA pressure. 3. The mitral valve is normal in structure. No evidence of mitral valve regurgitation. No evidence of mitral stenosis. 4. The aortic valve is tricuspid. Aortic valve regurgitation is not visualized. Aortic valve sclerosis/calcification is present, without any evidence of aortic stenosis. 5. Aortic dilatation noted. There is mild dilatation of the aortic root, measuring 38 mm. 6. The inferior vena cava is normal in size with greater than 50% respiratory variability, suggesting right atrial pressure of 3 mmHg.  FINDINGS Left Ventricle: Left ventricular ejection fraction, by estimation, is 60 to 65%. Left ventricular ejection fraction by 3D volume is 59 %. The left ventricle has normal function. The left ventricle has no regional wall motion abnormalities. The average left ventricular global longitudinal strain is -22.3 %. The global longitudinal strain is normal. The left ventricular internal cavity size was normal in size. There is mild left ventricular hypertrophy of the basal-septal segment. Left ventricular diastolic parameters were normal. Normal left ventricular filling pressure.  Right Ventricle: The right ventricular size is mildly enlarged. No increase in right ventricular wall thickness. Right ventricular systolic function is normal. Tricuspid regurgitation signal is inadequate for assessing PA pressure.  Left Atrium:  Left atrial size was normal in size.  Right Atrium: Right atrial size was normal in size.  Pericardium: There is no evidence of pericardial effusion.  Mitral Valve: The mitral valve is normal in structure. Mild mitral annular calcification. No evidence of mitral valve regurgitation. No evidence of mitral valve stenosis.  Tricuspid Valve: The tricuspid valve is normal in structure. Tricuspid valve regurgitation is not demonstrated. No evidence of tricuspid stenosis.  Aortic Valve: The aortic valve is tricuspid. Aortic valve regurgitation is not visualized. Aortic valve sclerosis/calcification is present, without any evidence of aortic stenosis.  Pulmonic Valve: The pulmonic valve was normal in structure. Pulmonic valve regurgitation is not visualized. No evidence of pulmonic stenosis.  Aorta: Aortic dilatation noted. There is mild dilatation of the aortic root, measuring 38 mm.  Venous: The inferior vena cava is normal in size with greater than 50% respiratory variability, suggesting right atrial pressure of 3 mmHg.  IAS/Shunts: No atrial level shunt detected by color flow Doppler.   LEFT VENTRICLE PLAX 2D LVIDd:         4.30 cm         Diastology LVIDs:         2.70 cm         LV e' medial:    9.51 cm/s LV PW:         1.10 cm         LV E/e' medial:  13.0 LV IVS:        1.20 cm         LV e' lateral:   10.80 cm/s LVOT diam:     2.30 cm         LV E/e' lateral: 11.5 LV SV:         113 LV SV Index:   49              2D LVOT Area:     4.15 cm        Longitudinal Strain 2D Strain GLS  -21.9 % (A2C): 2D Strain GLS  -23.5 % (A3C): 2D Strain GLS  -21.4 % (A4C): 2D Strain GLS  -22.3 % Avg:  3D Volume EF LV 3D EF:    Left ventricul ar ejection fraction by 3D volume is 59 %.  3D Volume EF: 3D EF:        59 % LV EDV:       112 ml LV ESV:       46 ml LV SV:        66 ml  RIGHT VENTRICLE             IVC RV Basal diam:  4.00 cm     IVC diam: 2.00 cm RV S prime:     16.40  cm/s TAPSE (M-mode): 2.4 cm  LEFT ATRIUM             Index        RIGHT ATRIUM           Index LA diam:        3.80 cm 1.63 cm/m   RA Pressure: 8.00 mmHg LA Vol (A2C):   40.6 ml 17.39 ml/m  RA Area:     16.40 cm LA Vol (A4C):   44.1 ml 18.89 ml/m  RA Volume:   40.60 ml  17.39 ml/m LA Biplane Vol: 45.2 ml 19.37 ml/m AORTIC VALVE LVOT Vmax:   128.00 cm/s LVOT Vmean:  88.600 cm/s LVOT VTI:    0.273 m  AORTA Ao Root diam: 3.80 cm Ao Asc diam:  3.50 cm  MITRAL VALVE  TRICUSPID VALVE Estimated RAP:  8.00 mmHg MV Decel Time: 162 msec MV E velocity: 124.00 cm/s  SHUNTS MV A velocity: 94.30 cm/s   Systemic VTI:  0.27 m MV E/A ratio:  1.31         Systemic Diam: 2.30 cm  Fransico Him MD Electronically signed by Fransico Him MD Signature Date/Time: 11/15/2021/11:38:49 AM    Final     CT SCANS  CT CARDIAC SCORING (SELF PAY ONLY) 10/25/2021  Addendum 10/25/2021  1:08 PM ADDENDUM REPORT: 10/25/2021 13:05  CLINICAL DATA:  Cardiovascular Disease Risk stratification  EXAM: Coronary Calcium Score  TECHNIQUE: A gated, non-contrast computed tomography scan of the heart was performed using 83m slice thickness. Axial images were analyzed on a dedicated workstation. Calcium scoring of the coronary arteries was performed using the Agatston method.  FINDINGS: Coronary arteries: Normal origins.  Coronary Calcium Score:  Left main: 0  Left anterior descending artery: 497  Left circumflex artery: 101  Right coronary artery: 20  Total: 619  Percentile: 96  Pericardium: Normal.  Ascending Aorta: Normal caliber.  Non-cardiac: [facility].  IMPRESSION: Coronary calcium score of 619. This was 981percentile for age-, race-, and sex-matched controls.  RECOMMENDATIONS: Coronary artery calcium (CAC) score is a strong predictor of incident coronary heart disease (CHD) and provides predictive information beyond traditional  risk factors. CAC scoring is reasonable to use in the decision to withhold, postpone, or initiate statin therapy in intermediate-risk or selected borderline-risk asymptomatic adults (age 57108-75years and LDL-C >=70 to <190 mg/dL) who do not have diabetes or established atherosclerotic cardiovascular disease (ASCVD).* In intermediate-risk (10-year ASCVD risk >=7.5% to <20%) adults or selected borderline-risk (10-year ASCVD risk >=5% to <7.5%) adults in whom a CAC score is measured for the purpose of making a treatment decision the following recommendations have been made:  If CAC=0, it is reasonable to withhold statin therapy and reassess in 5 to 10 years, as long as higher risk conditions are absent (diabetes mellitus, family history of premature CHD in first degree relatives (males <55 years; females <65 years), cigarette smoking, or LDL >=190 mg/dL).  If CAC is 1 to 99, it is reasonable to initiate statin therapy for patients >=516years of age.  If CAC is >=100 or >=75th percentile, it is reasonable to initiate statin therapy at any age.  Cardiology referral should be considered for patients with CAC scores >=400 or >=75th percentile.  *2018 AHA/ACC/AACVPR/AAPA/ABC/ACPM/ADA/AGS/APhA/ASPC/NLA/PCNA Guideline on the Management of Blood Cholesterol: A Report of the American College of Cardiology/American Heart Association Task Force on Clinical Practice Guidelines. J Am Coll Cardiol. 2019;73(24):3168-3209.   Electronically Signed By: MCandee FurbishM.D. On: 10/25/2021 13:05  Narrative EXAM: OVER-READ INTERPRETATION  CT CHEST  The following report is an over-read performed by radiologist Dr. GAletta Edouardof [facility], [place] 10/25/2021. This over-read does not include interpretation of cardiac or coronary anatomy or pathology. The coronary calcium score interpretation by the cardiologist is attached.  COMPARISON:  None.  FINDINGS: Vascular: Mild atherosclerosis  of the visualized thoracic aorta.  Mediastinum/Nodes: Visualized mediastinum and hilar regions demonstrate no lymphadenopathy or masses.  Lungs/Pleura: Visualized lungs show no evidence of pulmonary edema, consolidation, pneumothorax, nodule or pleural fluid.  Upper Abdomen: No acute abnormality.  Musculoskeletal: No chest wall mass or suspicious bone lesions identified.  IMPRESSION: Mild atherosclerosis of the thoracic aorta.  Electronically Signed: By: GAletta EdouardM.D. On: 10/25/2021 10:48  Recent Labs: 09/15/2022: ALT 15; BUN 16; Creatinine, Ser 0.83; Hemoglobin 16.6; Platelets 227.0; Potassium 4.2; Sodium 142; TSH 1.48  Recent Lipid Panel    Component Value Date/Time   CHOL 87 09/15/2022 1138   CHOL 113 01/31/2022 0901   TRIG 73.0 09/15/2022 1138   TRIG 213 (HH) 08/28/2006 1631   HDL 31.90 (L) 09/15/2022 1138   HDL 40 01/31/2022 0901   CHOLHDL 3 09/15/2022 1138   VLDL 14.6 09/15/2022 1138   LDLCALC 40 09/15/2022 1138   LDLCALC 58 01/31/2022 0901   LDLCALC 96 03/09/2020 1108   LDLDIRECT 75.0 09/10/2021 0948       Physical Exam:    VS:  BP 136/70   Pulse 82   Ht '5\' 11"'$  (1.803 m)   Wt 266 lb (120.7 kg)   SpO2 97%   BMI 37.10 kg/m     Wt Readings from Last 3 Encounters:  10/07/22 266 lb (120.7 kg)  09/19/22 266 lb (120.7 kg)  07/05/22 261 lb (118.4 kg)    Gen: no distress, morbid obesity   Neck: No JVD Ears: Bilateral Pilar Plate Sign Cardiac: No Rubs or Gallops, systolic murmur , RRR +2 radial pulses Respiratory: Clear to auscultation bilaterally, normal effort, normal  respiratory rate GI: Soft, nontender, non-distended  MS: No  edema;  moves all extremities Integument: Skin feels well Neuro:  At time of evaluation, alert and oriented to person/place/time/situation  Psych: Normal affect, patient feels ok  ASSESSMENT:    1. Hypertension associated with type 2 diabetes mellitus ([place])   2. Aortic atherosclerosis (HCC)   3. Heart murmur,  systolic     PLAN:    HLD With CAC and aortic atherosclerosis With DM on SGLT2i  Former Smoker - ASA 81 mg  - LDL at goal on zetia and statin   AV calcium without AS (but with murmur) HTN and morbid obesity (on Rybelsus) - BP is at goal on CCB and ARB - no evidence of LVOT obstruction; his septal hypertrophy could be related to his prior HTN  We have discussed lifestyle interventions for BP control - We have discussed an exercise plan built on walking 30 minutes a day during screen time - We have created several sample meal plans using Mediterranean diet geared at slight calories reduction and decreased processed food intake  One year me or APP        Medication Adjustments/Labs and Tests Ordered: Current medicines are reviewed at length with the patient today.  Concerns regarding medicines are outlined above.  Orders Placed This Encounter  Procedures   EKG 12-Lead   No orders of the defined types were placed in this encounter.   Patient Instructions  Medication Instructions:  The current medical regimen is effective;  continue present plan and medications.  *If you need a refill on your cardiac medications before your next appointment, please call your pharmacy*  Follow-Up: At [facility], you and your health needs are our priority.  As part of our continuing mission to provide you with exceptional heart care, we have created designated Provider Care Teams.  These Care Teams include your primary Cardiologist (physician) and Advanced Practice Providers (APPs -  Physician Assistants and Nurse Practitioners) who all work together to provide you with the care you need, when you need it.  We recommend signing up for the patient portal called "MyChart".  Sign up information is provided on this After Visit Summary.  MyChart is used to connect with patients for Virtual Visits (Telemedicine).  Patients are able to view lab/test results, encounter notes, upcoming  appointments, etc.  Non-urgent messages can be sent to your provider as well.   To learn more about what you can do with MyChart, go to [email or web address].    Your next appointment:   1 year(s)  Provider:   Werner Lean, MD        Signed, Jeffrey Lean, MD  10/07/2022 10:06 AM    [place]

## 2022-10-07 ENCOUNTER — Ambulatory Visit: Payer: 59 | Attending: Internal Medicine | Admitting: Internal Medicine

## 2022-10-07 ENCOUNTER — Encounter: Payer: Self-pay | Admitting: Internal Medicine

## 2022-10-07 VITALS — BP 136/70 | HR 82 | Ht 71.0 in | Wt 266.0 lb

## 2022-10-07 DIAGNOSIS — I700 Atherosclerosis of aorta: Secondary | ICD-10-CM | POA: Diagnosis not present

## 2022-10-07 DIAGNOSIS — I152 Hypertension secondary to endocrine disorders: Secondary | ICD-10-CM | POA: Diagnosis not present

## 2022-10-07 DIAGNOSIS — R011 Cardiac murmur, unspecified: Secondary | ICD-10-CM | POA: Diagnosis not present

## 2022-10-07 DIAGNOSIS — E1159 Type 2 diabetes mellitus with other circulatory complications: Secondary | ICD-10-CM

## 2022-10-07 NOTE — Patient Instructions (Signed)
Medication Instructions:  The current medical regimen is effective;  continue present plan and medications.  *If you need a refill on your cardiac medications before your next appointment, please call your pharmacy*  Follow-Up: At [facility], you and your health needs are our priority.  As part of our continuing mission to provide you with exceptional heart care, we have created designated Provider Care Teams.  These Care Teams include your primary Cardiologist (physician) and Advanced Practice Providers (APPs -  Physician Assistants and Nurse Practitioners) who all work together to provide you with the care you need, when you need it.  We recommend signing up for the patient portal called "MyChart".  Sign up information is provided on this After Visit Summary.  MyChart is used to connect with patients for Virtual Visits (Telemedicine).  Patients are able to view lab/test results, encounter notes, upcoming appointments, etc.  Non-urgent messages can be sent to your provider as well.   To learn more about what you can do with MyChart, go to [email or web address].    Your next appointment:   1 year(s)  Provider:   Werner Lean, MD

## 2022-10-19 ENCOUNTER — Ambulatory Visit: Payer: 59 | Admitting: Dermatology

## 2022-10-19 VITALS — BP 146/81 | HR 82

## 2022-10-19 DIAGNOSIS — L814 Other melanin hyperpigmentation: Secondary | ICD-10-CM

## 2022-10-19 DIAGNOSIS — Z85828 Personal history of other malignant neoplasm of skin: Secondary | ICD-10-CM

## 2022-10-19 DIAGNOSIS — L219 Seborrheic dermatitis, unspecified: Secondary | ICD-10-CM | POA: Diagnosis not present

## 2022-10-19 DIAGNOSIS — L821 Other seborrheic keratosis: Secondary | ICD-10-CM

## 2022-10-19 DIAGNOSIS — K130 Diseases of lips: Secondary | ICD-10-CM

## 2022-10-19 DIAGNOSIS — D229 Melanocytic nevi, unspecified: Secondary | ICD-10-CM

## 2022-10-19 DIAGNOSIS — L578 Other skin changes due to chronic exposure to nonionizing radiation: Secondary | ICD-10-CM

## 2022-10-19 DIAGNOSIS — Z1283 Encounter for screening for malignant neoplasm of skin: Secondary | ICD-10-CM

## 2022-10-19 DIAGNOSIS — D1801 Hemangioma of skin and subcutaneous tissue: Secondary | ICD-10-CM

## 2022-10-19 DIAGNOSIS — Z79899 Other long term (current) drug therapy: Secondary | ICD-10-CM

## 2022-10-19 MED ORDER — TACROLIMUS 0.1 % EX OINT: TOPICAL_OINTMENT | CUTANEOUS | 6 refills | Status: DC

## 2022-10-19 MED ORDER — ZORYVE 0.3 % EX FOAM: 1.0000 | Freq: Every day | CUTANEOUS | 6 refills | Status: DC | PRN

## 2022-10-19 NOTE — Patient Instructions (Addendum)
For dryness under eyes  Can start Zoryve 0.3 % foam - apply thin amount to affected areas under eyes daily as needed for dryness.   For chapped lips  Start tacrolimus 0.1 % ointment - apply thin layer topically to chapped lips daily to twice daily as needed.   Your prescription was sent to [facility] in [place]. A representative from [place] will contact you within 3 business hours to verify your address and insurance information to schedule a free delivery. If for any reason you do not receive a phone call from them, please reach out to them. Their phone number is [phone] and their hours are Monday-Friday 9:00 am-5:00 pm.      Melanoma ABCDEs  Melanoma is the most dangerous type of skin cancer, and is the leading cause of death from skin disease.  You are more likely to develop melanoma if you: Have light-colored skin, light-colored eyes, or red or blond hair Spend a lot of time in the sun Tan regularly, either outdoors or in a tanning bed Have had blistering sunburns, especially during childhood Have a close family member who has had a melanoma Have atypical moles or large birthmarks  Early detection of melanoma is key since treatment is typically straightforward and cure rates are extremely high if we catch it early.   The first sign of melanoma is often a change in a mole or a new dark spot.  The ABCDE system is a way of remembering the signs of melanoma.  A for asymmetry:  The two halves do not match. B for border:  The edges of the growth are irregular. C for color:  A mixture of colors are present instead of an even brown color. D for diameter:  Melanomas are usually (but not always) greater than 71m - the size of a pencil eraser. E for evolution:  The spot keeps changing in size, shape, and color.  Please check your skin once per month between visits. You can use a small mirror in front and a large mirror behind you to keep an eye on the back side or  your body.   If you see any new or changing lesions before your next follow-up, please call to schedule a visit.  Please continue daily skin protection including broad spectrum sunscreen SPF 30+ to sun-exposed areas, reapplying every 2 hours as needed when you're outdoors.   Staying in the shade or wearing long sleeves, sun glasses (UVA+UVB protection) and wide brim hats (4-inch brim around the entire circumference of the hat) are also recommended for sun protection.    Due to recent changes in healthcare laws, you may see results of your pathology and/or laboratory studies on MyChart before the doctors have had a chance to review them. We understand that in some cases there may be results that are confusing or concerning to you. Please understand that not all results are received at the same time and often the doctors may need to interpret multiple results in order to provide you with the best plan of care or course of treatment. Therefore, we ask that you please give u[place]2 business days to thoroughly review all your results before contacting the office for clarification. Should we see a critical lab result, you will be contacted sooner.   If You Need Anything After Your Visit  If you have any questions or concerns for your doctor, please call our main line at [phone] press option 4 to reach your doctor's medical assistant.  If no one answers, please leave a voicemail as directed and we will return your call as soon as possible. Messages left after 4 pm will be answered the following business day.   You may also send [place] a message via [place]. We typically respond to MyChart messages within 1-2 business days.  For prescription refills, please ask your pharmacy to contact our office. Our fax number is [phone].  If you have an urgent issue when the clinic is closed that cannot wait until the next business day, you can page your doctor at the number below.    Please note that while we  do our best to be available for urgent issues outside of office hours, we are not available 24/7.   If you have an urgent issue and are unable to reach [place], you may choose to seek medical care at your doctor's office, retail clinic, urgent care center, or emergency room.  If you have a medical emergency, please immediately call 911 or go to the emergency department.  Pager Numbers  - Dr. Nehemiah Massed: [phone]  - Dr. Laurence Ferrari: [phone]  - Dr. Nicole Kindred: [phone]  In the event of inclement weather, please call our main line at [phone] for an update on the status of any delays or closures.  Dermatology Medication Tips: Please keep the boxes that topical medications come in in order to help keep track of the instructions about where and how to use these. Pharmacies typically print the medication instructions only on the boxes and not directly on the medication tubes.   If your medication is too expensive, please contact our office at [phone] option 4 or send [place] a message through [place].   We are unable to tell what your co-pay for medications will be in advance as this is different depending on your insurance coverage. However, we may be able to find a substitute medication at lower cost or fill out paperwork to get insurance to cover a needed medication.   If a prior authorization is required to get your medication covered by your insurance company, please allow [place] 1-2 business days to complete this process.  Drug prices often vary depending on where the prescription is filled and some pharmacies may offer cheaper prices.  The website www.goodrx.com contains coupons for medications through different pharmacies. The prices here do not account for what the cost may be with help from insurance (it may be cheaper with your insurance), but the website can give you the price if you did not use any insurance.  - You can print the associated coupon and take it with your prescription to  the pharmacy.  - You may also stop by our office during regular business hours and pick up a GoodRx coupon card.  - If you need your prescription sent electronically to a different pharmacy, notify our office through [facility] or by phone at [phone] option 4.     Si Usted Necesita Algo Despus de Su Visita  Tambin puede enviarnos un mensaje a travs de Pharmacist, community. Por lo general respondemos a los mensajes de MyChart en el transcurso de 1 a 2 das hbiles.  Para renovar recetas, por favor pida a su farmacia que se ponga en contacto con nuestra oficina. Harland Dingwall de fax es [place] [phone].  Si tiene un asunto urgente cuando la clnica est cerrada y que no puede esperar hasta el siguiente da hbil, puede llamar/localizar a su doctor(a) al nmero que aparece a continuacin.   Por favor, tenga  en cuenta que aunque hacemos todo lo posible para estar disponibles para asuntos urgentes fuera del horario de [place], no estamos disponibles las 24 horas del da, los 7 das de la [place].   Si tiene un problema urgente y no puede comunicarse con nosotros, puede optar por buscar atencin mdica  en el consultorio de su doctor(a), en una clnica privada, en un centro de atencin urgente o en una sala de emergencias.  Si tiene Engineering geologist, por favor llame inmediatamente al 911 o vaya a la sala de emergencias.  Nmeros de bper  - Dr. Nehemiah Massed: [phone]  - Dra. Moye: [phone]  - Dra. Nicole Kindred: [phone]  En caso de inclemencias del [place], por favor llame a Johnsie Kindred principal al [phone] para una actualizacin sobre el [place] de cualquier retraso o cierre.  Consejos para la medicacin en dermatologa: Por favor, guarde las cajas en las que vienen los medicamentos de uso tpico para ayudarle a seguir las instrucciones sobre dnde y cmo usarlos. Las farmacias generalmente imprimen las instrucciones del medicamento slo en las cajas y no directamente en  los tubos del [place].   Si su medicamento es muy caro, por favor, pngase en contacto con Zigmund Daniel llamando al [phone] y presione la opcin 4 o envenos un mensaje a travs de Pharmacist, community.   No podemos decirle cul ser su copago por los medicamentos por adelantado ya que esto es diferente dependiendo de la cobertura de su seguro. Sin embargo, es posible que podamos encontrar un medicamento sustituto a Electrical engineer un formulario para que el seguro cubra el medicamento que se considera necesario.   Si se requiere una autorizacin previa para que su compaa de seguros [place] su medicamento, por favor permtanos de 1 a 2 das hbiles para completar este proceso.  Los precios de los medicamentos varan con frecuencia dependiendo del Environmental consultant de dnde se surte la receta y alguna farmacias pueden ofrecer precios ms baratos.  El sitio web www.goodrx.com tiene cupones para medicamentos de Airline pilot. Los precios aqu no tienen en cuenta lo que podra costar con la ayuda del seguro (puede ser ms barato con su seguro), pero el sitio web puede darle el precio si no utiliz Research scientist (physical sciences).  - Puede imprimir el cupn correspondiente y llevarlo con su receta a la farmacia.  - Tambin puede pasar por nuestra oficina durante el horario de atencin regular y Charity fundraiser una tarjeta de cupones de GoodRx.  - Si necesita que su receta se enve electrnicamente a una farmacia diferente, informe a nuestra oficina a travs de MyChart de [place] o por telfono llamando al [phone] y presione la opcin 4.

## 2022-10-19 NOTE — Progress Notes (Signed)
New Patient Visit  Subjective  Jeffrey [name] is a 57 y.o. male who presents for the following: New Patient (Initial Visit) (Patient reports history of bcc in 2010 at right shoulder, reports that he would like to get checked. Patient reports some dryness under eye, chronic chapped lips).  Seb derm at lower eyelids   The patient presents for Total-Body Skin Exam (TBSE) for skin cancer screening and mole check.  The patient has spots, moles and lesions to be evaluated, some may be new or changing and the patient has concerns that these could be cancer.  The following portions of the chart were reviewed this encounter and updated as appropriate:   Tobacco  Allergies  Meds  Problems  Med Hx  Surg Hx  Fam Hx     Review of Systems:  No other skin or systemic complaints except as noted in HPI or Assessment and Plan.  Objective  Well appearing patient in no apparent distress; mood and affect are within normal limits.  A full examination was performed including scalp, head, eyes, ears, nose, lips, neck, chest, axillae, abdomen, back, buttocks, bilateral upper extremities, bilateral lower extremities, hands, feet, fingers, toes, fingernails, and toenails. All findings within normal limits unless otherwise noted below.   Assessment & Plan  Seborrheic dermatitis Head - Anterior (Face)  Seborrheic Dermatitis  -  is a chronic persistent rash characterized by pinkness and scaling most commonly of the mid face but also can occur on the scalp (dandruff), ears; mid chest, mid back and groin.  It tends to be exacerbated by stress and cooler weather.  People who have neurologic disease may experience new onset or exacerbation of existing seborrheic dermatitis.  The condition is not curable but treatable and can be controlled.  Recommend zoryve foam at [place] - use once daily to aa under eyes prn for dryness Given sample of zoryve cream to use daily prn   Roflumilast, Antiseborrheic, (ZORYVE)  0.3 % FOAM - Head - Anterior (Face) Apply 1 Application topically daily as needed. Apply thin amount aa under eyes for dryness  Cheilitis Mid [place] Lip  many years  Tacrolimus ointment - apply topically qd/bid prn for chapped lips  tacrolimus (PROTOPIC) 0.1 % ointment - Mid [place] Lip Apply thin layer to lips qd/bid as needed for chapped lips  Lentigines - Scattered tan macules - Due to sun exposure - Benign-appearing, observe - Recommend daily broad spectrum sunscreen SPF 30+ to sun-exposed areas, reapply every 2 hours as needed. - Call for any changes  Seborrheic Keratoses - Stuck-on, waxy, tan-brown papules and/or plaques  - Benign-appearing - Discussed benign etiology and prognosis. - Observe - Call for any changes  Varicose Veins/Spider Veins - Dilated blue, purple or red veins at the lower extremities - Reassured - Smaller vessels can be treated by sclerotherapy (a procedure to inject a medicine into the veins to make them disappear) if desired, but the treatment is not covered by insurance. Larger vessels may be covered if symptomatic and we would refer to vascular surgeon if treatment desired.  Melanocytic Nevi - Tan-brown and/or pink-flesh-colored symmetric macules and papules - Benign appearing on exam today - Observation - Call clinic for new or changing moles - Recommend daily use of broad spectrum spf 30+ sunscreen to sun-exposed areas.   Hemangiomas - Red papules - Discussed benign nature - Observe - Call for any changes  Actinic Damage - Chronic condition, secondary to cumulative UV/sun exposure - diffuse scaly erythematous macules  with underlying dyspigmentation - Recommend daily broad spectrum sunscreen SPF 30+ to sun-exposed areas, reapply every 2 hours as needed.  - Staying in the shade or wearing long sleeves, sun glasses (UVA+UVB protection) and wide brim hats (4-inch brim around the entire circumference of the hat) are also  recommended for sun protection.  - Call for new or changing lesions.  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  - Call if any new or changing lesions are noted between office visits  Skin cancer screening performed today. Return in about 1 year (around 10/19/2023) for TBSE.  IRuthell Rummage, CMA, am acting as scribe for Sarina Ser, MD. Documentation: I have reviewed the above documentation for accuracy and completeness, and I agree with the above.  Sarina Ser, MD

## 2022-10-23 ENCOUNTER — Encounter: Payer: Self-pay | Admitting: Dermatology

## 2022-11-01 ENCOUNTER — Other Ambulatory Visit: Payer: Self-pay | Admitting: Internal Medicine

## 2022-11-08 ENCOUNTER — Other Ambulatory Visit: Payer: Self-pay | Admitting: Internal Medicine

## 2022-11-23 ENCOUNTER — Telehealth: Payer: 59 | Admitting: Physician Assistant

## 2022-11-23 DIAGNOSIS — L237 Allergic contact dermatitis due to plants, except food: Secondary | ICD-10-CM

## 2022-11-23 MED ORDER — TRIAMCINOLONE ACETONIDE 0.1 % EX CREA
1.0000 | TOPICAL_CREAM | Freq: Three times a day (TID) | CUTANEOUS | 0 refills | Status: DC
Start: 2022-11-23 — End: 2024-07-09

## 2022-11-23 NOTE — Progress Notes (Signed)
E-Visit for [employer]  We are sorry that you are not feeing well.  Here is how we plan to help!  Based on what you have shared with me it looks like you have had an allergic reaction to the oily resin from a group of plants.  This resin is very sticky, so it easily attaches to your skin, clothing, tools equipment, and pet's fur.    This blistering rash is often called poison ivy rash although it can come from contact with the leaves, stems and roots of poison ivy, poison oak and poison sumac.  The oily resin contains urushiol (u-ROO-she-ol) that produces a skin rash on exposed skin.  The severity of the rash depends on the amount of urushiol that gets on your skin.  A section of skin with more urushiol on it may develop a rash sooner.  The rash usually develops 12-48 hours after exposure and can last two to three weeks.  Your skin must come in direct contact with the plant's oil to be affected.  Blister fluid doesn't spread the rash.  However, if you come into contact with a piece of clothing or pet fur that has urushiol on it, the rash may spread out.  You can also transfer the oil to other parts of your body with your fingers.  Often the rash looks like a straight line because of the way the plant brushes against your skin.  Most poison ivy treatments are usually limited to self-care methods. Small areas of the rash will typically go away on its own in two to three weeks.  Since your rash is limited, I am recommending that you follow these recommendations:  Make sure that the clothes you were wearing and any towels or sheets that may have come in contact with the oil (urushiol) are washed in detergent and hot water.  Apply Benadryl or Caladryl lotion to the rash.  You may apply these as often as needed to control the itching.  Cool baths also often help with itching.  You may also apply an over-the-counter corticosteroid cream for the first few days.  Take oral antihistamines, such as diphenhydramine  (Benadryl, others), which may also help you sleep better.  Soak in a cool-water bath containing an oatmeal-based bath product (Aveeno).  Place Cool, wet compresses on the affected area for 15 to 30 minutes several times a day.  Avoid a hot shower or bath as this may increase your itching.     I have developed the following plan to treat your condition I am prescribing Topical steroid cream Triamcinolone to apply topically three times daily. If this does not help over the next week, or if rash from poison oak worsens, please reach back out and we can try oral steroids (Prednisone). These can increase blood sugar so have to be monitored closely.   What can you do to prevent this rash?  Avoid the plants.  Learn how to identify poison ivy, poison oak and poison sumac in all seasons.  When hiking or engaging in other activities that might expose you to these plants, try to stay on cleared pathways.  If camping, make sure you pitch your tent in an area free of these plants.  Keep pets from running through wooded areas so that urushiol doesn't accidentally stick to their fur, which you may touch.  Remove or kill the plants.  In your yard, you can get rid of poison ivy by applying an herbicide or pulling it out of  the ground, including the roots, while wearing heavy gloves.  Afterward remove the gloves and thoroughly wash them and your hands.  Don't burn poison ivy or related plants because the urushiol can be carried by smoke.  Wear protective clothing.  If needed, protect your skin by wearing socks, boots, pants, long sleeves and vinyl gloves.  Wash your skin right away.  Washing off the oil with soap and water within 30 minutes of exposure may reduce your chances of getting a poison ivy rash.  Even washing after an hour or so can help reduce the severity of the rash.  If you walk through some poison ivy and then later touch your shoes, you may get some urushiol on your hands, which may then transfer to your  face or body by touching or rubbing.  If the contaminated object isn't cleaned, the urushiol on it can still cause a skin reaction years later.    Be careful not to reuse towels after you have washed your skin.  Also carefully wash clothing in detergent and hot water to remove all traces of the oil.  Handle contaminated clothing carefully so you don't transfer the urushiol to yourself, furniture, rugs or appliances.  Remember that pets can carry the oil on their fur and paws.  If you think your pet may be contaminated with urushiol, put on some long rubber gloves and give your pet a bath.  Finally, be careful not to burn these plants as the smoke can contain traces of the oil.  Inhaling the smoke may result in difficulty breathing. If that occurred you should see a physician as soon as possible.  See your doctor right away if:  The reaction is severe or widespread You inhaled the smoke from burning poison ivy and are having difficulty breathing Your skin continues to swell The rash affects your eyes, mouth or genitals Blisters are oozing pus You develop a fever greater than 100 F (37.8 C) The rash doesn't get better within a few weeks.  If you scratch the poison ivy rash, bacteria under your fingernails may cause the skin to become infected.  See your doctor if pus starts oozing from the blisters.  Treatment generally includes antibiotics.  Poison ivy treatments are usually limited to self-care methods.  And the rash typically goes away on its own in two to three weeks.     If the rash is widespread or results in a large number of blisters, your doctor may prescribe an oral corticosteroid, such as prednisone.  If a bacterial infection has developed at the rash site, your doctor may give you a prescription for an oral antibiotic.  MAKE SURE YOU  Understand these instructions. Will watch your condition. Will get help right away if you are not doing well or get worse.   Thank you for  choosing an e-visit.  Your e-visit answers were reviewed by a board certified advanced clinical practitioner to complete your personal care plan. Depending upon the condition, your plan could have included both over the counter or prescription medications.  Please review your pharmacy choice. Make sure the pharmacy is open so you can pick up prescription now. If there is a problem, you may contact your provider through [employer]MyChart messaging and have the prescription routed to another pharmacy.  Your safety is important to us. If you have drug allergies check your prescription carefully.   For the next 24 hours you can use MyChart to ask questions about today's visit, request a non-urgent  call back, or ask for a work or school excuse. You will get an email in the next two days asking about your experience. I hope that your e-visit has been valuable and will speed your recovery.    I have spent 5 minutes in review of e-visit questionnaire, review and updating patient chart, medical decision making and response to patient.   Margaretann Loveless, PA-C

## 2022-11-25 ENCOUNTER — Other Ambulatory Visit: Payer: Self-pay | Admitting: Internal Medicine

## 2022-11-25 ENCOUNTER — Other Ambulatory Visit: Payer: Self-pay | Admitting: Orthopaedic Surgery

## 2022-11-25 DIAGNOSIS — M5136 Other intervertebral disc degeneration, lumbar region: Secondary | ICD-10-CM

## 2022-11-25 DIAGNOSIS — M4807 Spinal stenosis, lumbosacral region: Secondary | ICD-10-CM

## 2022-11-25 MED ORDER — PREDNISONE 10 MG (21) PO TBPK
ORAL_TABLET | ORAL | 0 refills | Status: DC
Start: 2022-11-25 — End: 2024-07-09

## 2022-11-25 NOTE — Addendum Note (Signed)
Addended by: Margaretann Loveless on: 11/25/2022 12:16 PM   Modules accepted: Orders

## 2022-12-05 ENCOUNTER — Encounter: Payer: Self-pay | Admitting: Orthopaedic Surgery

## 2022-12-11 ENCOUNTER — Ambulatory Visit
Admission: RE | Admit: 2022-12-11 | Discharge: 2022-12-11 | Disposition: A | Discharge status: Home / Self Care | Payer: 59 | Source: Ambulatory Visit | Attending: Orthopaedic Surgery | Admitting: Orthopaedic Surgery

## 2022-12-11 DIAGNOSIS — M4807 Spinal stenosis, lumbosacral region: Secondary | ICD-10-CM

## 2022-12-11 DIAGNOSIS — M5136 Other intervertebral disc degeneration, lumbar region: Secondary | ICD-10-CM

## 2023-01-04 ENCOUNTER — Other Ambulatory Visit: Payer: Self-pay | Admitting: Internal Medicine

## 2023-01-20 ENCOUNTER — Other Ambulatory Visit: Payer: Self-pay | Admitting: Internal Medicine

## 2023-03-01 ENCOUNTER — Other Ambulatory Visit: Payer: Self-pay | Admitting: Internal Medicine

## 2023-03-10 ENCOUNTER — Other Ambulatory Visit: Payer: Self-pay | Admitting: Internal Medicine

## 2023-03-20 ENCOUNTER — Ambulatory Visit: Payer: 59 | Admitting: Internal Medicine

## 2023-03-23 ENCOUNTER — Other Ambulatory Visit (INDEPENDENT_AMBULATORY_CARE_PROVIDER_SITE_OTHER): Payer: 59

## 2023-03-23 DIAGNOSIS — E559 Vitamin D deficiency, unspecified: Secondary | ICD-10-CM

## 2023-03-23 DIAGNOSIS — E1165 Type 2 diabetes mellitus with hyperglycemia: Secondary | ICD-10-CM | POA: Diagnosis not present

## 2023-03-23 LAB — VITAMIN D 25 HYDROXY (VIT D DEFICIENCY, FRACTURES): VITD: 24.82 ng/mL — ABNORMAL LOW (ref 30.00–100.00)

## 2023-03-23 LAB — BASIC METABOLIC PANEL
BUN: 15 mg/dL (ref 6–23)
CO2: 26 mEq/L (ref 19–32)
Calcium: 9.2 mg/dL (ref 8.4–10.5)
Chloride: 106 mEq/L (ref 96–112)
Creatinine, Ser: 0.87 mg/dL (ref 0.40–1.50)
GFR: 94.7 mL/min (ref >60.00)
Glucose, Bld: 144 mg/dL — ABNORMAL HIGH (ref 70–99)
Potassium: 4.2 mEq/L (ref 3.5–5.1)
Sodium: 139 mEq/L (ref 135–145)

## 2023-03-23 LAB — HEPATIC FUNCTION PANEL
ALT: 18 U/L (ref 0–53)
AST: 14 U/L (ref 0–37)
Albumin: 4.4 g/dL (ref 3.5–5.2)
Alkaline Phosphatase: 47 U/L (ref 39–117)
Bilirubin, Direct: 0.1 mg/dL (ref 0.0–0.3)
Total Bilirubin: 0.4 mg/dL (ref 0.2–1.2)
Total Protein: 6.5 g/dL (ref 6.0–8.3)

## 2023-03-23 LAB — LIPID PANEL
Cholesterol: 93 mg/dL (ref 0–200)
HDL: 37.4 mg/dL — ABNORMAL LOW (ref >39.00)
LDL Cholesterol: 34 mg/dL (ref 0–99)
NonHDL: 55.55
Total CHOL/HDL Ratio: 2
Triglycerides: 110 mg/dL (ref 0.0–149.0)
VLDL: 22 mg/dL (ref 0.0–40.0)

## 2023-03-23 LAB — HEMOGLOBIN A1C: Hgb A1c MFr Bld: 6.9 % — ABNORMAL HIGH (ref 4.6–6.5)

## 2023-03-24 ENCOUNTER — Ambulatory Visit: Payer: 59 | Admitting: Internal Medicine

## 2023-03-24 VITALS — BP 120/70 | HR 75 | Temp 98.6°F | Ht 71.0 in | Wt 261.2 lb

## 2023-03-24 DIAGNOSIS — Z7984 Long term (current) use of oral hypoglycemic drugs: Secondary | ICD-10-CM

## 2023-03-24 DIAGNOSIS — E559 Vitamin D deficiency, unspecified: Secondary | ICD-10-CM | POA: Diagnosis not present

## 2023-03-24 DIAGNOSIS — Z125 Encounter for screening for malignant neoplasm of prostate: Secondary | ICD-10-CM

## 2023-03-24 DIAGNOSIS — E1165 Type 2 diabetes mellitus with hyperglycemia: Secondary | ICD-10-CM | POA: Diagnosis not present

## 2023-03-24 DIAGNOSIS — E7800 Pure hypercholesterolemia, unspecified: Secondary | ICD-10-CM

## 2023-03-24 DIAGNOSIS — E1159 Type 2 diabetes mellitus with other circulatory complications: Secondary | ICD-10-CM | POA: Diagnosis not present

## 2023-03-24 DIAGNOSIS — I152 Hypertension secondary to endocrine disorders: Secondary | ICD-10-CM

## 2023-03-24 DIAGNOSIS — E538 Deficiency of other specified B group vitamins: Secondary | ICD-10-CM

## 2023-03-24 MED ORDER — TIRZEPATIDE 2.5 MG/0.5ML ~~LOC~~ SOAJ: 2.5000 mg | SUBCUTANEOUS | 11 refills | Status: DC

## 2023-03-24 NOTE — Patient Instructions (Addendum)
Ok to change the rybelsus to the mounjaro 2.5 mg, and let us know every month if you need the next higher dose for better sugar and weight loss  Please continue all other medications as before, and refills have been done if requested.  Please have the pharmacy call with any other refills you may need.  Please continue your efforts at being more active, low cholesterol diet, and weight control.  You are otherwise up to date with prevention measures today.  Please keep your appointments with your specialists as you may have planned  Please make an Appointment to return in 6 months, or sooner if needed, also with Lab Appointment for testing done 3-5 days before at the FIRST FLOOR Lab (so this is for TWO appointments - please see the scheduling desk as you leave)

## 2023-03-24 NOTE — Progress Notes (Unsigned)
Patient ID: Jeffrey [name], male   DOB: 30-Mar-1966, 57 y.o.   MRN: [id number]        Chief Complaint: follow up HTN, HLD and hyperglycemia, low vit d       HPI:  Jeffrey [name] is a 57 y.o. male here overall doing ok, Pt denies chest pain, increased sob or doe, wheezing, orthopnea, PND, increased LE swelling, palpitations, dizziness or syncope.   Pt denies polydipsia, polyuria, or new focal neuro s/s.    Pt denies fever, wt loss, night sweats, loss of appetite, or other constitutional symptoms         Wt Readings from Last 3 Encounters:  03/24/23 261 lb 3.2 oz (118.5 kg)  10/07/22 266 lb (120.7 kg)  09/19/22 266 lb (120.7 kg)   BP Readings from Last 3 Encounters:  03/24/23 120/70  10/19/22 (!) 146/81  10/07/22 136/70         Past Medical History:  Diagnosis Date   Allergy    ANXIETY 07/05/2007   Arthritis 08/2015   neck and back   Cancer (HCC)    basal cell   CARPAL TUNNEL SYNDROME, BILATERAL 07/05/2007   Cervicalgia 03/18/2008   DEPRESSION 07/05/2007   DIABETES MELLITUS, TYPE II 07/05/2007   ERECTILE DYSFUNCTION 07/11/2007   GERD 07/11/2007   HYPERLIPIDEMIA 07/05/2007   HYPERTENSION 07/05/2007   LOW BACK PAIN 07/11/2007   Meralgia paresthetica 09/25/2008   PEPTIC ULCER DISEASE 07/05/2007   SINUSITIS- ACUTE-NOS 03/26/2009   SKIN LESION 03/18/2008   Past Surgical History:  Procedure Laterality Date   ANTERIOR CERVICAL DECOMP/DISCECTOMY FUSION N/A 01/16/2020   Procedure: Cervical 4-5 Cervical 5-6 Cervical 6-7 Anterior cervical decompression/discectomy/fusion;  Surgeon: Maeola Harman, MD;  Location: [facility] OR;  Service: Neurosurgery;  Laterality: N/A;  3C   BASAL CELL CARCINOMA EXCISION Right 2011   shoulder area   CARPAL TUNNEL RELEASE Left 01/16/2020   Procedure: LEFT CARPAL TUNNEL RELEASE;  Surgeon: Maeola Harman, MD;  Location: [facility] OR;  Service: Neurosurgery;  Laterality: Left;  3C   CARPAL TUNNEL RELEASE Right 02/25/2020   Procedure: RIGHT CARPAL TUNNEL RELEASE;  Surgeon:  Maeola Harman, MD;  Location: [facility] OR;  Service: Neurosurgery;  Laterality: Right;   FRACTURE SURGERY     left wrist surgury/fracture  1985   SHOULDER ARTHROSCOPY WITH SUBACROMIAL DECOMPRESSION Left 03/30/2017   Procedure: SHOULDER ARTHROSCOPY WITH DEBRIDEMENT ROTATOR CUFF,SUBACROMIAL DECOMPRESSION AND OPEN TENODESIS;  Surgeon: Jones Broom, MD;  Location: [facility] OR;  Service: Orthopedics;  Laterality: Left;  SHOULDER ARTHROSCOPY WITH DEBRIDEMENT ROTATOR CUFF,SUBACROMIAL DECOMPRESSION AND POSSIBLE OPEN TENODESIS   WISDOM TOOTH EXTRACTION      reports that he quit smoking about 9 years ago. His smoking use included cigars. He has quit using smokeless tobacco. He reports current alcohol use. He reports that he does not use drugs. family history includes Cancer in his maternal grandfather; Diabetes in his father, mother, and another family member; Heart disease in his maternal grandmother; Hypertension in his father, mother, and another family member. Allergies  Allergen Reactions   Codeine Itching and Other (See Comments)   Current Outpatient Medications on File Prior to Visit  Medication Sig Dispense Refill   ALPRAZolam (XANAX) 1 MG tablet TAKE 1 TABLET BY MOUTH TWICE A DAY AS NEEDED 60 tablet 2   amLODipine (NORVASC) 10 MG tablet Take 1 tablet (10 mg total) by mouth daily. 90 tablet 3   aspirin EC 81 MG tablet Take 81 mg by mouth every evening.     atorvastatin (LIPITOR)  80 MG tablet TAKE 1 TABLET BY MOUTH EVERY DAY 90 tablet 2   carisoprodol (SOMA) 350 MG tablet TAKE 1 TABLET BY MOUTH UP TO 3 TIMES A DAY AS NEEDED AS DIRECTED 90 tablet 2   Cholecalciferol 50 MCG (2000 UT) TABS 1 tab by mouth once daily 30 tablet 99   ezetimibe (ZETIA) 10 MG tablet TAKE 1 TABLET BY MOUTH EVERY DAY 90 tablet 3   FARXIGA 10 MG TABS tablet TAKE 1 TABLET BY MOUTH EVERY DAY BEFORE BREAKFAST 90 tablet 3   gabapentin (NEURONTIN) 300 MG capsule TAKE 1 CAPSULE BY MOUTH EVERYDAY AT BEDTIME 90 capsule 1   glipiZIDE  (GLUCOTROL XL) 10 MG 24 hr tablet TAKE 1 TABLET (10 MG TOTAL) BY MOUTH DAILY WITH BREAKFAST. 90 tablet 3   losartan (COZAAR) 100 MG tablet TAKE 1 TABLET BY MOUTH EVERY DAY 90 tablet 3   metFORMIN (GLUCOPHAGE) 500 MG tablet TAKE 2 TABLETS BY MOUTH EVERY MORNING AND TAKE 2 TABLETS BY MOUTH EVERY EVENING 360 tablet 3   naproxen (NAPROSYN) 500 MG tablet Take 1 tablet (500 mg total) by mouth 2 (two) times daily with a meal. 30 tablet 0   pioglitazone (ACTOS) 45 MG tablet TAKE 1 TABLET BY MOUTH EVERY DAY 90 tablet 3   predniSONE (STERAPRED UNI-PAK 21 TAB) 10 MG (21) TBPK tablet 6 day taper; take as directed on package instructions 21 tablet 0   Roflumilast, Antiseborrheic, (ZORYVE) 0.3 % FOAM Apply 1 Application topically daily as needed. Apply thin amount aa under eyes for dryness 60 g 6   tacrolimus (PROTOPIC) 0.1 % ointment Apply thin layer to lips qd/bid as needed for chapped lips 100 g 6   triamcinolone (NASACORT) 55 MCG/ACT AERO nasal inhaler Place 2 sprays into the nose daily. 3 each 3   triamcinolone cream (KENALOG) 0.1 % Apply 1 Application topically 3 (three) times daily. 45 g 0   No current facility-administered medications on file prior to visit.        ROS:  All others reviewed and negative.  Objective        PE:  BP 120/70 (BP Location: Left Arm, Patient Position: Sitting, Cuff Size: Normal)   Pulse 75   Temp 98.6 F (37 C) (Oral)   Ht 5\' 11"  (1.803 m)   Wt 261 lb 3.2 oz (118.5 kg)   SpO2 95%   BMI 36.43 kg/m                 Constitutional: Pt appears in NAD               HENT: Head: NCAT.                Right Ear: External ear normal.                 Left Ear: External ear normal.                Eyes: . Pupils are equal, round, and reactive to light. Conjunctivae and EOM are normal               Nose: without d/c or deformity               Neck: Neck supple. Gross normal ROM               Cardiovascular: Normal rate and regular rhythm.                 Pulmonary/Chest:  Effort normal and  breath sounds without rales or wheezing.                Abd:  Soft, NT, ND, + BS, no organomegaly               Neurological: Pt is alert. At baseline orientation, motor grossly intact               Skin: Skin is warm. No rashes, no other new lesions, LE edema  none               Psychiatric: Pt behavior is normal without agitation   Micro: none  Cardiac tracings I have personally interpreted today:  none  Pertinent Radiological findings (summarize): none   Lab Results  Component Value Date   WBC 4.7 09/15/2022   HGB 16.6 09/15/2022   HCT 49.0 09/15/2022   PLT 227.0 09/15/2022   GLUCOSE 144 (H) 03/23/2023   CHOL 93 03/23/2023   TRIG 110.0 03/23/2023   HDL 37.40 (L) 03/23/2023   LDLDIRECT 75.0 09/10/2021   LDLCALC 34 03/23/2023   ALT 18 03/23/2023   AST 14 03/23/2023   NA 139 03/23/2023   K 4.2 03/23/2023   CL 106 03/23/2023   CREATININE 0.87 03/23/2023   BUN 15 03/23/2023   CO2 26 03/23/2023   TSH 1.48 09/15/2022   PSA 0.55 09/15/2022   HGBA1C 6.9 (H) 03/23/2023   MICROALBUR <0.7 09/15/2022   Assessment/Plan:  Jeffrey [name] is a 57 y.o. White or Caucasian [1] male with  has a past medical history of Allergy, ANXIETY (07/05/2007), Arthritis (08/2015), Cancer (HCC), CARPAL TUNNEL SYNDROME, BILATERAL (07/05/2007), Cervicalgia (03/18/2008), DEPRESSION (07/05/2007), DIABETES MELLITUS, TYPE II (07/05/2007), ERECTILE DYSFUNCTION (07/11/2007), GERD (07/11/2007), HYPERLIPIDEMIA (07/05/2007), HYPERTENSION (07/05/2007), LOW BACK PAIN (07/11/2007), Meralgia paresthetica (09/25/2008), PEPTIC ULCER DISEASE (07/05/2007), SINUSITIS- ACUTE-NOS (03/26/2009), and SKIN LESION (03/18/2008).  Diabetes (HCC) Lab Results  Component Value Date   HGBA1C 6.9 (H) 03/23/2023   Uncontrolled with obesity, pt to continue current medical treatment farxiga 10 every day, goipizpide ER 10 every day, metfomrin 500 mg - 2 bid, actos 45 every day, and change rybelsus to mounjaro 2.5 mg  weekly   Hyperlipidemia Lab Results  Component Value Date   LDLCALC 34 03/23/2023   Stable, pt to continue current statin lipitor 80 mg every day, zetia 10 qd   Hypertension associated with type 2 diabetes mellitus (HCC) BP Readings from Last 3 Encounters:  03/24/23 120/70  10/19/22 (!) 146/81  10/07/22 136/70   Stable, pt to continue medical treatment norvasc 10 mg every day, losartan 100 qd   Vitamin D deficiency Last vitamin D Lab Results  Component Value Date   VD25OH 24.82 (L) 03/23/2023   Low, to start oral replacement  Followup: Return in about 6 months (around 09/24/2023).  Oliver Barre, MD 03/25/2023 7:05 PM [facility] - [facility] Internal Medicine

## 2023-03-25 ENCOUNTER — Encounter: Payer: Self-pay | Admitting: Internal Medicine

## 2023-03-25 NOTE — Assessment & Plan Note (Signed)
BP Readings from Last 3 Encounters:  03/24/23 120/70  10/19/22 (!) 146/81  10/07/22 136/70   Stable, pt to continue medical treatment norvasc 10 mg every day, losartan 100 qd

## 2023-03-25 NOTE — Assessment & Plan Note (Signed)
Last vitamin D Lab Results  Component Value Date   VD25OH 24.82 (L) 03/23/2023   Low, to start oral replacement

## 2023-03-25 NOTE — Assessment & Plan Note (Signed)
Lab Results  Component Value Date   HGBA1C 6.9 (H) 03/23/2023   Uncontrolled with obesity, pt to continue current medical treatment farxiga 10 every day, goipizpide ER 10 every day, metfomrin 500 mg - 2 bid, actos 45 every day, and change rybelsus to mounjaro 2.5 mg weekly

## 2023-03-25 NOTE — Assessment & Plan Note (Signed)
Lab Results  Component Value Date   LDLCALC 34 03/23/2023   Stable, pt to continue current statin lipitor 80 mg every day, zetia 10 qd

## 2023-04-04 ENCOUNTER — Encounter: Payer: Self-pay | Admitting: Internal Medicine

## 2023-04-05 MED ORDER — PIOGLITAZONE HCL 45 MG PO TABS: 45.0000 mg | ORAL_TABLET | Freq: Every day | ORAL | 3 refills | Status: DC

## 2023-04-05 MED ORDER — ATORVASTATIN CALCIUM 80 MG PO TABS: 80.0000 mg | ORAL_TABLET | Freq: Every day | ORAL | 3 refills | Status: DC

## 2023-04-05 MED ORDER — AMLODIPINE BESYLATE 10 MG PO TABS: 10.0000 mg | ORAL_TABLET | Freq: Every day | ORAL | 3 refills | Status: DC

## 2023-04-05 MED ORDER — METFORMIN HCL 500 MG PO TABS: ORAL_TABLET | ORAL | 3 refills | Status: DC

## 2023-04-19 ENCOUNTER — Encounter: Payer: Self-pay | Admitting: Internal Medicine

## 2023-04-19 MED ORDER — TIRZEPATIDE 5 MG/0.5ML ~~LOC~~ SOAJ: 5.0000 mg | SUBCUTANEOUS | 3 refills | Status: DC

## 2023-05-04 ENCOUNTER — Encounter: Payer: Self-pay | Admitting: Internal Medicine

## 2023-05-04 MED ORDER — ACCU-CHEK GUIDE CONTROL VI LIQD: 2 refills | Status: AC

## 2023-05-04 MED ORDER — ACCU-CHEK GUIDE VI STRP: ORAL_STRIP | 12 refills | Status: AC

## 2023-05-04 MED ORDER — ACCU-CHEK GUIDE W/DEVICE KIT: PACK | 0 refills | Status: AC

## 2023-05-12 ENCOUNTER — Encounter: Payer: Self-pay | Admitting: Internal Medicine

## 2023-05-12 MED ORDER — TIRZEPATIDE 7.5 MG/0.5ML ~~LOC~~ SOAJ: 7.5000 mg | SUBCUTANEOUS | 3 refills | Status: DC

## 2023-06-15 LAB — HM DIABETES EYE EXAM

## 2023-06-16 ENCOUNTER — Encounter: Payer: Self-pay | Admitting: Internal Medicine

## 2023-06-16 MED ORDER — TIRZEPATIDE 10 MG/0.5ML ~~LOC~~ SOAJ: 10.0000 mg | SUBCUTANEOUS | 3 refills | Status: DC

## 2023-06-19 ENCOUNTER — Other Ambulatory Visit: Payer: Self-pay | Admitting: Internal Medicine

## 2023-06-30 ENCOUNTER — Other Ambulatory Visit: Payer: Self-pay | Admitting: Internal Medicine

## 2023-07-12 NOTE — Telephone Encounter (Signed)
Abstracted by HIM - HM updated

## 2023-07-14 ENCOUNTER — Encounter: Payer: Self-pay | Admitting: Internal Medicine

## 2023-07-17 MED ORDER — TIRZEPATIDE 12.5 MG/0.5ML ~~LOC~~ SOAJ: 12.5000 mg | SUBCUTANEOUS | 11 refills | Status: DC

## 2023-08-11 ENCOUNTER — Other Ambulatory Visit: Payer: Self-pay | Admitting: Internal Medicine

## 2023-08-11 ENCOUNTER — Encounter: Payer: Self-pay | Admitting: Internal Medicine

## 2023-09-13 ENCOUNTER — Other Ambulatory Visit (HOSPITAL_COMMUNITY): Payer: Self-pay

## 2023-09-18 ENCOUNTER — Other Ambulatory Visit: Payer: Self-pay

## 2023-09-18 ENCOUNTER — Other Ambulatory Visit: Payer: Self-pay | Admitting: Internal Medicine

## 2023-09-22 ENCOUNTER — Other Ambulatory Visit (INDEPENDENT_AMBULATORY_CARE_PROVIDER_SITE_OTHER): Payer: 59

## 2023-09-22 DIAGNOSIS — E559 Vitamin D deficiency, unspecified: Secondary | ICD-10-CM | POA: Diagnosis not present

## 2023-09-22 DIAGNOSIS — Z125 Encounter for screening for malignant neoplasm of prostate: Secondary | ICD-10-CM | POA: Diagnosis not present

## 2023-09-22 DIAGNOSIS — E538 Deficiency of other specified B group vitamins: Secondary | ICD-10-CM | POA: Diagnosis not present

## 2023-09-22 DIAGNOSIS — E1165 Type 2 diabetes mellitus with hyperglycemia: Secondary | ICD-10-CM

## 2023-09-22 LAB — CBC WITH DIFFERENTIAL/PLATELET
Basophils Absolute: 0 10*3/uL (ref 0.0–0.1)
Basophils Relative: 1 % (ref 0.0–3.0)
Eosinophils Absolute: 0.1 10*3/uL (ref 0.0–0.7)
Eosinophils Relative: 3.1 % (ref 0.0–5.0)
HCT: 46 % (ref 39.0–52.0)
Hemoglobin: 15.6 g/dL (ref 13.0–17.0)
Lymphocytes Relative: 28.6 % (ref 12.0–46.0)
Lymphs Abs: 1.2 10*3/uL (ref 0.7–4.0)
MCHC: 33.9 g/dL (ref 30.0–36.0)
MCV: 92.9 fL (ref 78.0–100.0)
Monocytes Absolute: 0.5 10*3/uL (ref 0.1–1.0)
Monocytes Relative: 11.4 % (ref 3.0–12.0)
Neutro Abs: 2.4 10*3/uL (ref 1.4–7.7)
Neutrophils Relative %: 55.9 % (ref 43.0–77.0)
Platelets: 232 10*3/uL (ref 150.0–400.0)
RBC: 4.95 Mil/uL (ref 4.22–5.81)
RDW: 13.7 % (ref 11.5–15.5)
WBC: 4.3 10*3/uL (ref 4.0–10.5)

## 2023-09-22 LAB — HEMOGLOBIN A1C: Hgb A1c MFr Bld: 5.9 % (ref 4.6–6.5)

## 2023-09-22 LAB — HEPATIC FUNCTION PANEL
ALT: 12 U/L (ref 0–53)
AST: 12 U/L (ref 0–37)
Albumin: 4.2 g/dL (ref 3.5–5.2)
Alkaline Phosphatase: 47 U/L (ref 39–117)
Bilirubin, Direct: 0.1 mg/dL (ref 0.0–0.3)
Total Bilirubin: 0.5 mg/dL (ref 0.2–1.2)
Total Protein: 5.8 g/dL — ABNORMAL LOW (ref 6.0–8.3)

## 2023-09-22 LAB — BASIC METABOLIC PANEL
BUN: 12 mg/dL (ref 6–23)
CO2: 28 meq/L (ref 19–32)
Calcium: 8.7 mg/dL (ref 8.4–10.5)
Chloride: 105 meq/L (ref 96–112)
Creatinine, Ser: 0.74 mg/dL (ref 0.40–1.50)
GFR: 99.1 mL/min (ref >60.00)
Glucose, Bld: 120 mg/dL — ABNORMAL HIGH (ref 70–99)
Potassium: 4.2 meq/L (ref 3.5–5.1)
Sodium: 142 meq/L (ref 135–145)

## 2023-09-22 LAB — LIPID PANEL
Cholesterol: 77 mg/dL (ref 0–200)
HDL: 37.4 mg/dL — ABNORMAL LOW (ref >39.00)
LDL Cholesterol: 26 mg/dL (ref 0–99)
NonHDL: 39.59
Total CHOL/HDL Ratio: 2
Triglycerides: 70 mg/dL (ref 0.0–149.0)
VLDL: 14 mg/dL (ref 0.0–40.0)

## 2023-09-22 LAB — TSH: TSH: 1.37 u[IU]/mL (ref 0.35–5.50)

## 2023-09-22 LAB — URINALYSIS, ROUTINE W REFLEX MICROSCOPIC
Bilirubin Urine: NEGATIVE
Hgb urine dipstick: NEGATIVE
Ketones, ur: NEGATIVE
Leukocytes,Ua: NEGATIVE
Nitrite: NEGATIVE
RBC / HPF: NONE SEEN (ref 0–?)
Specific Gravity, Urine: 1.015 (ref 1.000–1.030)
Total Protein, Urine: NEGATIVE
Urine Glucose: >=1000 — AB
Urobilinogen, UA: 0.2 (ref 0.0–1.0)
WBC, UA: NONE SEEN (ref 0–?)
pH: 6.5 (ref 5.0–8.0)

## 2023-09-22 LAB — PSA: PSA: 0.56 ng/mL (ref 0.10–4.00)

## 2023-09-22 LAB — VITAMIN B12: Vitamin B-12: 310 pg/mL (ref 211–911)

## 2023-09-22 LAB — VITAMIN D 25 HYDROXY (VIT D DEFICIENCY, FRACTURES): VITD: 26.05 ng/mL — ABNORMAL LOW (ref 30.00–100.00)

## 2023-09-25 ENCOUNTER — Ambulatory Visit: Payer: 59 | Admitting: Internal Medicine

## 2023-09-25 ENCOUNTER — Encounter: Payer: Self-pay | Admitting: Internal Medicine

## 2023-09-25 VITALS — BP 120/80 | HR 82 | Temp 97.9°F | Ht 71.0 in | Wt 236.0 lb

## 2023-09-25 DIAGNOSIS — Z0001 Encounter for general adult medical examination with abnormal findings: Secondary | ICD-10-CM

## 2023-09-25 DIAGNOSIS — I152 Hypertension secondary to endocrine disorders: Secondary | ICD-10-CM

## 2023-09-25 DIAGNOSIS — E1165 Type 2 diabetes mellitus with hyperglycemia: Secondary | ICD-10-CM | POA: Diagnosis not present

## 2023-09-25 DIAGNOSIS — Z7984 Long term (current) use of oral hypoglycemic drugs: Secondary | ICD-10-CM

## 2023-09-25 DIAGNOSIS — E1159 Type 2 diabetes mellitus with other circulatory complications: Secondary | ICD-10-CM

## 2023-09-25 DIAGNOSIS — Z23 Encounter for immunization: Secondary | ICD-10-CM

## 2023-09-25 DIAGNOSIS — E7800 Pure hypercholesterolemia, unspecified: Secondary | ICD-10-CM | POA: Diagnosis not present

## 2023-09-25 DIAGNOSIS — E559 Vitamin D deficiency, unspecified: Secondary | ICD-10-CM

## 2023-09-25 NOTE — Progress Notes (Signed)
Patient ID: Jeffrey [name], male   DOB: February 21, 1966, 58 y.o.   MRN: [id number]         Chief Complaint:: wellness exam and dm, hld, low vit d, htn       HPI:  Jeffrey [name] is a 58 y.o. male here for wellness exam; up to date; due for prevnar 20                Also peak wt has been about 310 lbs about 2010.  Pt denies chest pain, increased sob or doe, wheezing, orthopnea, PND, increased LE swelling, palpitations, dizziness or syncope.   Pt denies polydipsia, polyuria, or new focal neuro s/s.    Pt denies fever, wt loss, night sweats, loss of appetite, or other constitutional symptoms  Has been able to lose another 25 lbs.      Wt Readings from Last 3 Encounters:  09/25/23 236 lb (107 kg)  03/24/23 261 lb 3.2 oz (118.5 kg)  10/07/22 266 lb (120.7 kg)   BP Readings from Last 3 Encounters:  09/25/23 120/80  03/24/23 120/70  10/19/22 (!) 146/81   Immunization History  Administered Date(s) Administered   H1N1 09/25/2008   Influenza Inj Mdck Quad Pf 05/25/2017, 05/12/2018, 04/25/2019, 04/28/2022   Influenza Split 07/04/2011   Influenza Whole 09/24/2009   Influenza,inj,Quad PF,6+ Mos 05/24/2013, 08/18/2015, 05/07/2020   Influenza-Unspecified 06/15/2016, 05/25/2021, 05/16/2023   PFIZER Comirnaty(Gray Top)Covid-19 Tri-Sucrose Vaccine 12/18/2020   PFIZER(Purple Top)SARS-COV-2 Vaccination 11/08/2019, 12/03/2019, 07/02/2020   PNEUMOCOCCAL CONJUGATE-20 09/25/2023   Pneumococcal Conjugate-13 08/31/2015   Pneumococcal Polysaccharide-23 08/23/2016   Td 09/25/2008   Tdap 03/01/2019   Zoster Recombinant(Shingrix) 03/15/2021, 09/13/2021   There are no preventive care reminders to display for this patient.     Past Medical History:  Diagnosis Date   Allergy    ANXIETY 07/05/2007   Arthritis 08/2015   neck and back   Cancer (HCC)    basal cell   CARPAL TUNNEL SYNDROME, BILATERAL 07/05/2007   Cervicalgia 03/18/2008   DEPRESSION 07/05/2007   DIABETES MELLITUS, TYPE II 07/05/2007    ERECTILE DYSFUNCTION 07/11/2007   GERD 07/11/2007   HYPERLIPIDEMIA 07/05/2007   HYPERTENSION 07/05/2007   LOW BACK PAIN 07/11/2007   Meralgia paresthetica 09/25/2008   PEPTIC ULCER DISEASE 07/05/2007   SINUSITIS- ACUTE-NOS 03/26/2009   SKIN LESION 03/18/2008   Past Surgical History:  Procedure Laterality Date   ANTERIOR CERVICAL DECOMP/DISCECTOMY FUSION N/A 01/16/2020   Procedure: Cervical 4-5 Cervical 5-6 Cervical 6-7 Anterior cervical decompression/discectomy/fusion;  Surgeon: Maeola Harman, MD;  Location: [facility] OR;  Service: Neurosurgery;  Laterality: N/A;  3C   BASAL CELL CARCINOMA EXCISION Right 2011   shoulder area   CARPAL TUNNEL RELEASE Left 01/16/2020   Procedure: LEFT CARPAL TUNNEL RELEASE;  Surgeon: Maeola Harman, MD;  Location: [facility] OR;  Service: Neurosurgery;  Laterality: Left;  3C   CARPAL TUNNEL RELEASE Right 02/25/2020   Procedure: RIGHT CARPAL TUNNEL RELEASE;  Surgeon: Maeola Harman, MD;  Location: [facility] OR;  Service: Neurosurgery;  Laterality: Right;   FRACTURE SURGERY     left wrist surgury/fracture  1985   SHOULDER ARTHROSCOPY WITH SUBACROMIAL DECOMPRESSION Left 03/30/2017   Procedure: SHOULDER ARTHROSCOPY WITH DEBRIDEMENT ROTATOR CUFF,SUBACROMIAL DECOMPRESSION AND OPEN TENODESIS;  Surgeon: Jones Broom, MD;  Location: [facility] OR;  Service: Orthopedics;  Laterality: Left;  SHOULDER ARTHROSCOPY WITH DEBRIDEMENT ROTATOR CUFF,SUBACROMIAL DECOMPRESSION AND POSSIBLE OPEN TENODESIS   WISDOM TOOTH EXTRACTION      reports that he quit smoking about 9 years ago. His  smoking use included cigars. He has quit using smokeless tobacco. He reports current alcohol use. He reports that he does not use drugs. family history includes Cancer in his maternal grandfather; Diabetes in his father, mother, and another family member; Heart disease in his maternal grandmother; Hypertension in his father, mother, and another family member. Allergies  Allergen Reactions   Codeine Itching and Other (See Comments)    Current Outpatient Medications on File Prior to Visit  Medication Sig Dispense Refill   ALPRAZolam (XANAX) 1 MG tablet TAKE 1 TABLET BY MOUTH TWICE A DAY AS NEEDED 60 tablet 2   amLODipine (NORVASC) 10 MG tablet Take 1 tablet (10 mg total) by mouth daily. 90 tablet 3   aspirin EC 81 MG tablet Take 81 mg by mouth every evening.     atorvastatin (LIPITOR) 80 MG tablet Take 1 tablet (80 mg total) by mouth daily. 90 tablet 3   Blood Glucose Calibration (ACCU-CHEK GUIDE CONTROL) LIQD Use as directed once daily as needed E11.9 1 each 2   Blood Glucose Monitoring Suppl (ACCU-CHEK GUIDE) w/Device KIT Use as directed four times per day E11.9 1 kit 0   carisoprodol (SOMA) 350 MG tablet TAKE 1 TABLET BY MOUTH UP TO 3 TIMES A DAY AS NEEDED AS DIRECTED 90 tablet 2   Cholecalciferol 50 MCG (2000 UT) TABS 1 tab by mouth once daily 30 tablet 99   ezetimibe (ZETIA) 10 MG tablet TAKE 1 TABLET BY MOUTH EVERY DAY 90 tablet 0   gabapentin (NEURONTIN) 300 MG capsule TAKE 1 CAPSULE BY MOUTH EVERYDAY AT BEDTIME 90 capsule 1   glucose blood (ACCU-CHEK GUIDE) test strip Use as instructed four times per day E11.9 400 each 12   losartan (COZAAR) 100 MG tablet TAKE 1 TABLET BY MOUTH EVERY DAY 90 tablet 3   metFORMIN (GLUCOPHAGE) 500 MG tablet TAKE 2 TABLETS BY MOUTH EVERY MORNING AND TAKE 2 TABLETS BY MOUTH EVERY EVENING 360 tablet 3   naproxen (NAPROSYN) 500 MG tablet Take 1 tablet (500 mg total) by mouth 2 (two) times daily with a meal. 30 tablet 0   pioglitazone (ACTOS) 45 MG tablet Take 1 tablet (45 mg total) by mouth daily. 90 tablet 3   predniSONE (STERAPRED UNI-PAK 21 TAB) 10 MG (21) TBPK tablet 6 day taper; take as directed on package instructions 21 tablet 0   Roflumilast, Antiseborrheic, (ZORYVE) 0.3 % FOAM Apply 1 Application topically daily as needed. Apply thin amount aa under eyes for dryness 60 g 6   tacrolimus (PROTOPIC) 0.1 % ointment Apply thin layer to lips qd/bid as needed for chapped lips 100 g 6    tirzepatide (MOUNJARO) 12.5 MG/0.5ML Pen Inject 12.5 mg into the skin once a week. 6 mL 11   triamcinolone (NASACORT) 55 MCG/ACT AERO nasal inhaler Place 2 sprays into the nose daily. 3 each 3   triamcinolone cream (KENALOG) 0.1 % Apply 1 Application topically 3 (three) times daily. 45 g 0   No current facility-administered medications on file prior to visit.        ROS:  All others reviewed and negative.  Objective        PE:  BP 120/80 (BP Location: Left Arm, Patient Position: Sitting, Cuff Size: Normal)   Pulse 82   Temp 97.9 F (36.6 C) (Oral)   Ht 5\' 11"  (1.803 m)   Wt 236 lb (107 kg)   SpO2 98%   BMI 32.92 kg/m  Constitutional: Pt appears in NAD               HENT: Head: NCAT.                Right Ear: External ear normal.                 Left Ear: External ear normal.                Eyes: . Pupils are equal, round, and reactive to light. Conjunctivae and EOM are normal               Nose: without d/c or deformity               Neck: Neck supple. Gross normal ROM               Cardiovascular: Normal rate and regular rhythm.                 Pulmonary/Chest: Effort normal and breath sounds without rales or wheezing.                Abd:  Soft, NT, ND, + BS, no organomegaly               Neurological: Pt is alert. At baseline orientation, motor grossly intact               Skin: Skin is warm. No rashes, no other new lesions, LE edema - none               Psychiatric: Pt behavior is normal without agitation   Micro: none  Cardiac tracings I have personally interpreted today:  none  Pertinent Radiological findings (summarize): none   Lab Results  Component Value Date   WBC 4.3 09/22/2023   HGB 15.6 09/22/2023   HCT 46.0 09/22/2023   PLT 232.0 09/22/2023   GLUCOSE 120 (H) 09/22/2023   CHOL 77 09/22/2023   TRIG 70.0 09/22/2023   HDL 37.40 (L) 09/22/2023   LDLDIRECT 75.0 09/10/2021   LDLCALC 26 09/22/2023   ALT 12 09/22/2023   AST 12 09/22/2023   NA  142 09/22/2023   K 4.2 09/22/2023   CL 105 09/22/2023   CREATININE 0.74 09/22/2023   BUN 12 09/22/2023   CO2 28 09/22/2023   TSH 1.37 09/22/2023   PSA 0.56 09/22/2023   HGBA1C 5.9 09/22/2023   MICROALBUR <0.7 09/22/2023   Assessment/Plan:  Jeffrey [name] is a 58 y.o. White or Caucasian [1] male with  has a past medical history of Allergy, ANXIETY (07/05/2007), Arthritis (08/2015), Cancer (HCC), CARPAL TUNNEL SYNDROME, BILATERAL (07/05/2007), Cervicalgia (03/18/2008), DEPRESSION (07/05/2007), DIABETES MELLITUS, TYPE II (07/05/2007), ERECTILE DYSFUNCTION (07/11/2007), GERD (07/11/2007), HYPERLIPIDEMIA (07/05/2007), HYPERTENSION (07/05/2007), LOW BACK PAIN (07/11/2007), Meralgia paresthetica (09/25/2008), PEPTIC ULCER DISEASE (07/05/2007), SINUSITIS- ACUTE-NOS (03/26/2009), and SKIN LESION (03/18/2008).  Encounter for well adult exam with abnormal findings Age and sex appropriate education and counseling updated with regular exercise and diet Referrals for preventative services - none needed Immunizations addressed - for prevnar 20 Smoking counseling  - none needed Evidence for depression or other mood disorder - none significant Most recent labs reviewed. I have personally reviewed and have noted: 1) the patient's medical and social history 2) The patient's current medications and supplements 3) The patient's height, weight, and BMI have been recorded in the chart   [facility] Lab Results  Component Value Date   HGBA1C 5.9 09/22/2023   With recent large wt loss and  planning to lose more, with overcontrolled A1c - ok to d/c glipizide, cont all other tx -  Metfomrin 1000 bd, actos 45 every day, jardiance 25 every day, mounjaro 12.5 mg weekly   Hyperlipidemia Lab Results  Component Value Date   LDLCALC 26 09/22/2023   Stable, pt to continue current statin lipitor 80 mg every day, zetia 10 qd   Hypertension associated with type 2 diabetes mellitus (HCC) BP Readings from Last 3  Encounters:  09/25/23 120/80  03/24/23 120/70  10/19/22 (!) 146/81   Stable, pt to continue medical treatment norvasc 10 every day, losartan 100 qd   Vitamin D deficiency Last vitamin D Lab Results  Component Value Date   VD25OH 26.05 (L) 09/22/2023   Low, to start oral replacement  Followup: Return in about 6 months (around 03/24/2024).  Oliver Barre, MD 09/26/2023 9:10 PM [facility] [place] [facility] - [facility] Internal Medicine

## 2023-09-25 NOTE — Patient Instructions (Signed)
 You had the Prevnar 20 pneumonia shot today  OK to STOP the glipizide   Please continue all other medications as before, and refills have been done if requested.  Please have the pharmacy call with any other refills you may need.  Please continue your efforts at being more active, low cholesterol diet, and weight control.  You are otherwise up to date with prevention measures today.  Please keep your appointments with your specialists as you may have planned  Please make an Appointment to return in 6 months, or sooner if needed, also with Lab Appointment for testing done 3-5 days before at the FIRST FLOOR Lab (so this is for TWO appointments - please see the scheduling desk as you leave)

## 2023-09-26 ENCOUNTER — Encounter: Payer: Self-pay | Admitting: Internal Medicine

## 2023-09-26 MED ORDER — EMPAGLIFLOZIN 25 MG PO TABS: 25.0000 mg | ORAL_TABLET | Freq: Every day | ORAL | 3 refills | Status: AC

## 2023-09-26 NOTE — Assessment & Plan Note (Signed)
Age and sex appropriate education and counseling updated with regular exercise and diet Referrals for preventative services - none needed Immunizations addressed - for prevnar 20 Smoking counseling  - none needed Evidence for depression or other mood disorder - none significant Most recent labs reviewed. I have personally reviewed and have noted: 1) the patient's medical and social history 2) The patient's current medications and supplements 3) The patient's height, weight, and BMI have been recorded in the chart

## 2023-09-26 NOTE — Assessment & Plan Note (Signed)
Lab Results  Component Value Date   LDLCALC 26 09/22/2023   Stable, pt to continue current statin lipitor 80 mg every day, zetia 10 qd

## 2023-09-26 NOTE — Assessment & Plan Note (Signed)
Last vitamin D Lab Results  Component Value Date   VD25OH 26.05 (L) 09/22/2023   Low, to start oral replacement

## 2023-09-26 NOTE — Assessment & Plan Note (Signed)
BP Readings from Last 3 Encounters:  09/25/23 120/80  03/24/23 120/70  10/19/22 (!) 146/81   Stable, pt to continue medical treatment norvasc 10 every day, losartan 100 qd

## 2023-09-26 NOTE — Assessment & Plan Note (Signed)
Lab Results  Component Value Date   HGBA1C 5.9 09/22/2023   With recent large wt loss and planning to lose more, with overcontrolled A1c - ok to d/c glipizide, cont all other tx -  Metfomrin 1000 bd, actos 45 every day, jardiance 25 every day, mounjaro 12.5 mg weekly

## 2023-09-28 ENCOUNTER — Other Ambulatory Visit (HOSPITAL_COMMUNITY): Payer: Self-pay

## 2023-09-28 ENCOUNTER — Telehealth: Payer: Self-pay

## 2023-09-28 LAB — MICROALBUMIN / CREATININE URINE RATIO
Creatinine,U: 66.1 mg/dL
Microalb Creat Ratio: 11 mg/g (ref 0.0–30.0)
Microalb, Ur: <0.7 mg/dL (ref 0.0–1.9)

## 2023-09-28 NOTE — Telephone Encounter (Signed)
See updated microalbumin results

## 2023-10-04 ENCOUNTER — Other Ambulatory Visit: Payer: Self-pay | Admitting: Internal Medicine

## 2023-10-11 ENCOUNTER — Other Ambulatory Visit (HOSPITAL_COMMUNITY): Payer: Self-pay

## 2023-10-11 ENCOUNTER — Telehealth: Payer: Self-pay

## 2023-10-11 NOTE — Telephone Encounter (Signed)
 Pharmacy Patient Advocate Encounter   Received notification from Pt Calls Messages that prior authorization for Mounjaro 12.5mg /0.27ml is required/requested.   Insurance verification completed.   The patient is insured through [employer] .   Per test claim: PA required; PA submitted to above mentioned insurance via CoverMyMeds Key/confirmation #/EOC  [id number] Status is pending

## 2023-10-17 NOTE — Telephone Encounter (Signed)
 Pharmacy Patient Advocate Encounter  Received notification from [employer] that Prior Authorization for [facility] 12.5mg /0.16ml has been APPROVED from 09/11/23 to 10/15/24   PA #/Case ID/Reference #: [id number]

## 2023-10-20 NOTE — Telephone Encounter (Signed)
 Marland Kitchen

## 2023-10-25 ENCOUNTER — Ambulatory Visit: Payer: 59 | Admitting: Dermatology

## 2023-11-10 ENCOUNTER — Other Ambulatory Visit: Payer: Self-pay | Admitting: Internal Medicine

## 2023-11-27 ENCOUNTER — Ambulatory Visit: Admitting: Dermatology

## 2023-12-12 ENCOUNTER — Other Ambulatory Visit: Payer: Self-pay | Admitting: Internal Medicine

## 2024-01-02 ENCOUNTER — Other Ambulatory Visit: Payer: Self-pay | Admitting: Internal Medicine

## 2024-01-04 ENCOUNTER — Other Ambulatory Visit: Payer: Self-pay | Admitting: Internal Medicine

## 2024-01-04 ENCOUNTER — Other Ambulatory Visit: Payer: Self-pay

## 2024-01-05 ENCOUNTER — Other Ambulatory Visit: Payer: Self-pay | Admitting: Internal Medicine

## 2024-01-29 ENCOUNTER — Ambulatory Visit: Admitting: Dermatology

## 2024-02-03 ENCOUNTER — Other Ambulatory Visit: Payer: Self-pay | Admitting: Internal Medicine

## 2024-02-13 ENCOUNTER — Encounter: Payer: Self-pay | Admitting: Dermatology

## 2024-02-13 ENCOUNTER — Ambulatory Visit: Admitting: Dermatology

## 2024-02-13 DIAGNOSIS — K130 Diseases of lips: Secondary | ICD-10-CM

## 2024-02-13 DIAGNOSIS — L578 Other skin changes due to chronic exposure to nonionizing radiation: Secondary | ICD-10-CM

## 2024-02-13 DIAGNOSIS — Z1283 Encounter for screening for malignant neoplasm of skin: Secondary | ICD-10-CM

## 2024-02-13 DIAGNOSIS — D485 Neoplasm of uncertain behavior of skin: Secondary | ICD-10-CM

## 2024-02-13 DIAGNOSIS — L821 Other seborrheic keratosis: Secondary | ICD-10-CM

## 2024-02-13 DIAGNOSIS — D1801 Hemangioma of skin and subcutaneous tissue: Secondary | ICD-10-CM

## 2024-02-13 DIAGNOSIS — D239 Other benign neoplasm of skin, unspecified: Secondary | ICD-10-CM

## 2024-02-13 DIAGNOSIS — L814 Other melanin hyperpigmentation: Secondary | ICD-10-CM

## 2024-02-13 DIAGNOSIS — Z7189 Other specified counseling: Secondary | ICD-10-CM

## 2024-02-13 DIAGNOSIS — D225 Melanocytic nevi of trunk: Secondary | ICD-10-CM

## 2024-02-13 DIAGNOSIS — L219 Seborrheic dermatitis, unspecified: Secondary | ICD-10-CM

## 2024-02-13 DIAGNOSIS — D229 Melanocytic nevi, unspecified: Secondary | ICD-10-CM

## 2024-02-13 DIAGNOSIS — Z79899 Other long term (current) drug therapy: Secondary | ICD-10-CM

## 2024-02-13 DIAGNOSIS — D492 Neoplasm of unspecified behavior of bone, soft tissue, and skin: Secondary | ICD-10-CM

## 2024-02-13 DIAGNOSIS — Z85828 Personal history of other malignant neoplasm of skin: Secondary | ICD-10-CM

## 2024-02-13 DIAGNOSIS — Z872 Personal history of diseases of the skin and subcutaneous tissue: Secondary | ICD-10-CM

## 2024-02-13 HISTORY — DX: Other benign neoplasm of skin, unspecified: D23.9

## 2024-02-13 MED ORDER — ZORYVE 0.3 % EX FOAM: 1.0000 | Freq: Every day | CUTANEOUS | 6 refills | Status: AC | PRN

## 2024-02-13 MED ORDER — TACROLIMUS 0.1 % EX OINT: TOPICAL_OINTMENT | CUTANEOUS | 3 refills | Status: AC

## 2024-02-13 NOTE — Patient Instructions (Addendum)

## 2024-02-13 NOTE — Progress Notes (Signed)
 Follow-Up Visit   Subjective  Jeffrey [name] is a 58 y.o. male who presents for the following: Skin Cancer Screening and Full Body Skin Exam  The patient presents for Total-Body Skin Exam (TBSE) for skin cancer screening and mole check. The patient has spots, moles and lesions to be evaluated, some may be new or changing and the patient may have concern these could be cancer.  The following portions of the chart were reviewed this encounter and updated as appropriate: medications, allergies, medical history  Review of Systems:  No other skin or systemic complaints except as noted in HPI or Assessment and Plan.  Objective  Well appearing patient in no apparent distress; mood and affect are within normal limits.  A full examination was performed including scalp, head, eyes, ears, nose, lips, neck, chest, axillae, abdomen, back, buttocks, bilateral upper extremities, bilateral lower extremities, hands, feet, fingers, toes, fingernails, and toenails. All findings within normal limits unless otherwise noted below.   Relevant physical exam findings are noted in the Assessment and Plan.  L mid to low back lat 0.8 x 0.4 cm irregular brown macule.   Assessment & Plan   SKIN CANCER SCREENING PERFORMED TODAY.  ACTINIC DAMAGE - Chronic condition, secondary to cumulative UV/sun exposure - diffuse scaly erythematous macules with underlying dyspigmentation - Recommend daily broad spectrum sunscreen SPF 30+ to sun-exposed areas, reapply every 2 hours as needed.  - Staying in the shade or wearing long sleeves, sun glasses (UVA+UVB protection) and wide brim hats (4-inch brim around the entire circumference of the hat) are also recommended for sun protection.  - Call for new or changing lesions.  LENTIGINES, SEBORRHEIC KERATOSES, HEMANGIOMAS - Benign normal skin lesions - Benign-appearing - Call for any changes  MELANOCYTIC NEVI - Tan-brown and/or pink-flesh-colored symmetric macules and  papules - Benign appearing on exam today - Observation - Call clinic for new or changing moles - Recommend daily use of broad spectrum spf 30+ sunscreen to sun-exposed areas.   History of Basal Cell Carcinoma of the Skin Reports history at right clavicle in 2010 - No evidence of recurrence today - Recommend regular full body skin exams - Recommend daily broad spectrum sunscreen SPF 30+ to sun-exposed areas, reapply every 2 hours as needed.  - Call if any new or changing lesions are noted between office visits  CHEILITIS  Cheilitis Mid [place] Lip  many years Tacrolimus  ointment - apply topically qd/bid prn for chapped lips Related Medications tacrolimus  (PROTOPIC ) 0.1 % ointment Apply thin layer to lips qd/bid as needed for chapped lips NEOPLASM OF UNCERTAIN BEHAVIOR OF SKIN L mid to low back lat Epidermal / dermal shaving  Lesion diameter (cm):  0.8 Informed consent: discussed and consent obtained   Timeout: patient name, date of birth, surgical site, and procedure verified   Procedure prep:  Patient was prepped and draped in usual sterile fashion Prep type:  Isopropyl alcohol Anesthesia: the lesion was anesthetized in a standard fashion   Anesthetic:  1% lidocaine  w/ epinephrine  1-100,000 buffered w/ 8.4% NaHCO3 Instrument used: flexible razor blade   Hemostasis achieved with: pressure, aluminum chloride and electrodesiccation   Outcome: patient tolerated procedure well   Post-procedure details: sterile dressing applied and wound care instructions given   Dressing type: bandage (Mupirocin 2% ointment)   Specimen 1 - Surgical pathology Differential Diagnosis: D48.5 r/o dysplastic nevus Check Margins: Yes SEBORRHEIC DERMATITIS  SEBORRHEIC DERMATITIS Exam: Pink patches with greasy scale at the face Chronic condition with duration or  expected duration over one year. Currently well-controlled. Seborrheic Dermatitis is a chronic persistent rash characterized by  pinkness and scaling most commonly of the mid face but also can occur on the scalp (dandruff), ears; mid chest, mid back and groin.  It tends to be exacerbated by stress and cooler weather.  People who have neurologic disease may experience new onset or exacerbation of existing seborrheic dermatitis.  The condition is not curable but treatable and can be controlled. Treatment Plan: Continue Zoryve  foam to aa's QD PRN.    Hx of rash on the legs - likely atopic dermatitis May use Zoryve  foam to aa's QD PRN.   HISTORY OF BASAL CELL CARCINOMA OF THE SKIN - No evidence of recurrence today - Recommend regular full body skin exams - Recommend daily broad spectrum sunscreen SPF 30+ to sun-exposed areas, reapply every 2 hours as needed.  - Call if any new or changing lesions are noted between office visits   Return in about 1 year (around 02/12/2025) for TBSE - hx BCC, seb derm, cheilitis.  LILLETTE Rosina Mayans, CMA, am acting as scribe for Alm Rhyme, MD .   Documentation: I have reviewed the above documentation for accuracy and completeness, and I agree with the above.  Alm Rhyme, MD

## 2024-02-14 LAB — SURGICAL PATHOLOGY

## 2024-02-19 ENCOUNTER — Ambulatory Visit: Payer: Self-pay | Admitting: Dermatology

## 2024-02-19 ENCOUNTER — Encounter: Payer: Self-pay | Admitting: Dermatology

## 2024-02-19 NOTE — Telephone Encounter (Addendum)
 Called and discussed results with patient. He verbalized understanding and denied further questions.   Will recheck at next follow up.   ----- Message from Alm Rhyme sent at 02/19/2024  5:25 PM EDT ----- FINAL DIAGNOSIS        1. Skin, L mid to low back lat :       DYSPLASTIC COMPOUND NEVUS WITH MODERATE ATYPIA, CLOSE TO MARGIN   Moderate dysplastic Recheck next visit ----- Message ----- From: Interface, Lab In Three Zero One Sent: 02/14/2024   6:14 PM EDT To: Alm JAYSON Rhyme, MD

## 2024-03-21 ENCOUNTER — Other Ambulatory Visit (INDEPENDENT_AMBULATORY_CARE_PROVIDER_SITE_OTHER)

## 2024-03-21 DIAGNOSIS — E1165 Type 2 diabetes mellitus with hyperglycemia: Secondary | ICD-10-CM

## 2024-03-21 DIAGNOSIS — E559 Vitamin D deficiency, unspecified: Secondary | ICD-10-CM | POA: Diagnosis not present

## 2024-03-21 LAB — LIPID PANEL
Cholesterol: 86 mg/dL (ref 0–200)
HDL: 38.5 mg/dL — ABNORMAL LOW (ref >39.00)
LDL Cholesterol: 33 mg/dL (ref 0–99)
NonHDL: 47.51
Total CHOL/HDL Ratio: 2
Triglycerides: 72 mg/dL (ref 0.0–149.0)
VLDL: 14.4 mg/dL (ref 0.0–40.0)

## 2024-03-21 LAB — BASIC METABOLIC PANEL WITH GFR
BUN: 11 mg/dL (ref 6–23)
CO2: 23 meq/L (ref 19–32)
Calcium: 9.2 mg/dL (ref 8.4–10.5)
Chloride: 106 meq/L (ref 96–112)
Creatinine, Ser: 0.69 mg/dL (ref 0.40–1.50)
GFR: 100.86 mL/min (ref >60.00)
Glucose, Bld: 137 mg/dL — ABNORMAL HIGH (ref 70–99)
Potassium: 4.2 meq/L (ref 3.5–5.1)
Sodium: 142 meq/L (ref 135–145)

## 2024-03-21 LAB — HEPATIC FUNCTION PANEL
ALT: 14 U/L (ref 0–53)
AST: 13 U/L (ref 0–37)
Albumin: 4.3 g/dL (ref 3.5–5.2)
Alkaline Phosphatase: 50 U/L (ref 39–117)
Bilirubin, Direct: 0.1 mg/dL (ref 0.0–0.3)
Total Bilirubin: 0.4 mg/dL (ref 0.2–1.2)
Total Protein: 6 g/dL (ref 6.0–8.3)

## 2024-03-21 LAB — HEMOGLOBIN A1C: Hgb A1c MFr Bld: 6.4 % (ref 4.6–6.5)

## 2024-03-21 LAB — VITAMIN D 25 HYDROXY (VIT D DEFICIENCY, FRACTURES): VITD: 33.46 ng/mL (ref 30.00–100.00)

## 2024-03-25 ENCOUNTER — Encounter: Payer: Self-pay | Admitting: Internal Medicine

## 2024-03-25 ENCOUNTER — Ambulatory Visit: Payer: 59 | Admitting: Internal Medicine

## 2024-03-25 VITALS — BP 114/68 | HR 86 | Temp 98.3°F | Ht 71.0 in | Wt 216.0 lb

## 2024-03-25 DIAGNOSIS — E1165 Type 2 diabetes mellitus with hyperglycemia: Secondary | ICD-10-CM

## 2024-03-25 DIAGNOSIS — Z7985 Long-term (current) use of injectable non-insulin antidiabetic drugs: Secondary | ICD-10-CM

## 2024-03-25 DIAGNOSIS — Z7984 Long term (current) use of oral hypoglycemic drugs: Secondary | ICD-10-CM

## 2024-03-25 DIAGNOSIS — Z125 Encounter for screening for malignant neoplasm of prostate: Secondary | ICD-10-CM

## 2024-03-25 DIAGNOSIS — J309 Allergic rhinitis, unspecified: Secondary | ICD-10-CM | POA: Diagnosis not present

## 2024-03-25 DIAGNOSIS — E559 Vitamin D deficiency, unspecified: Secondary | ICD-10-CM

## 2024-03-25 DIAGNOSIS — E7800 Pure hypercholesterolemia, unspecified: Secondary | ICD-10-CM

## 2024-03-25 DIAGNOSIS — E1159 Type 2 diabetes mellitus with other circulatory complications: Secondary | ICD-10-CM | POA: Diagnosis not present

## 2024-03-25 DIAGNOSIS — I152 Hypertension secondary to endocrine disorders: Secondary | ICD-10-CM

## 2024-03-25 DIAGNOSIS — E538 Deficiency of other specified B group vitamins: Secondary | ICD-10-CM

## 2024-03-25 NOTE — Patient Instructions (Signed)
 Ok to double up on the Vifamin D supplement  Please continue all other medications as before, and refills have been done if requested.  Please have the pharmacy call with any other refills you may need.  Please continue your efforts at being more active, low cholesterol diet, and weight control.  Please keep your appointments with your specialists as you may have planned  Please make an Appointment to return in 6 months, or sooner if needed, also with Lab Appointment for testing done 3-5 days before at the FIRST FLOOR Lab (so this is for TWO appointments - please see the scheduling desk as you leave)

## 2024-03-25 NOTE — Addendum Note (Signed)
 Addended by: NORLEEN LYNWOOD ORN on: 03/25/2024 12:16 PM   Modules accepted: Orders

## 2024-03-25 NOTE — Progress Notes (Signed)
 Patient ID: Jeffrey [name], male   DOB: 05-22-1966, 58 y.o.   MRN: [id number]        Chief Complaint: follow up HTN, HLD and hyperglycemia , low vit d       HPI:  Jeffrey [name] is a 58 y.o. male here overall diong ok, Pt denies chest pain, increased sob or doe, wheezing, orthopnea, PND, increased LE swelling, palpitations, dizziness or syncope. Pt denies polydipsia, polyuria, or new focal neuro s/s.    Pt denies fever, wt loss, night sweats, loss of appetite, or other constitutional symptoms  Does have several wks ongoing nasal allergy symptoms with clearish congestion, itch and sneezing, without fever, pain, ST, cough, swelling or wheezing.       Wt Readings from Last 3 Encounters:  03/25/24 216 lb (98 kg)  09/25/23 236 lb (107 kg)  03/24/23 261 lb 3.2 oz (118.5 kg)   BP Readings from Last 3 Encounters:  03/25/24 114/68  09/25/23 120/80  03/24/23 120/70         Past Medical History:  Diagnosis Date   Allergy    ANXIETY 07/05/2007   Arthritis 08/2015   neck and back   Basal cell carcinoma 2010   R clavicle [facility] in [facility])    basal cell   CARPAL TUNNEL SYNDROME, BILATERAL 07/05/2007   Cervicalgia 03/18/2008   DEPRESSION 07/05/2007   DIABETES MELLITUS, TYPE II 07/05/2007   Dysplastic nevus 02/13/2024   left mid to low back lateral - moderate - recheck at next follow up   ERECTILE DYSFUNCTION 07/11/2007   GERD 07/11/2007   HYPERLIPIDEMIA 07/05/2007   HYPERTENSION 07/05/2007   LOW BACK PAIN 07/11/2007   Meralgia paresthetica 09/25/2008   PEPTIC ULCER DISEASE 07/05/2007   SINUSITIS- ACUTE-NOS 03/26/2009   SKIN LESION 03/18/2008   Past Surgical History:  Procedure Laterality Date   ANTERIOR CERVICAL DECOMP/DISCECTOMY FUSION N/A 01/16/2020   Procedure: Cervical 4-5 Cervical 5-6 Cervical 6-7 Anterior cervical decompression/discectomy/fusion;  Surgeon: Unice Pac, MD;  Location: [facility] OR;  Service: Neurosurgery;  Laterality: N/A;  3C   BASAL CELL  CARCINOMA EXCISION Right 2011   shoulder area   CARPAL TUNNEL RELEASE Left 01/16/2020   Procedure: LEFT CARPAL TUNNEL RELEASE;  Surgeon: Unice Pac, MD;  Location: [facility] OR;  Service: Neurosurgery;  Laterality: Left;  3C   CARPAL TUNNEL RELEASE Right 02/25/2020   Procedure: RIGHT CARPAL TUNNEL RELEASE;  Surgeon: Unice Pac, MD;  Location: [facility] OR;  Service: Neurosurgery;  Laterality: Right;   FRACTURE SURGERY     left wrist surgury/fracture  1985   SHOULDER ARTHROSCOPY WITH SUBACROMIAL DECOMPRESSION Left 03/30/2017   Procedure: SHOULDER ARTHROSCOPY WITH DEBRIDEMENT ROTATOR CUFF,SUBACROMIAL DECOMPRESSION AND OPEN TENODESIS;  Surgeon: Dozier Soulier, MD;  Location: [facility] OR;  Service: Orthopedics;  Laterality: Left;  SHOULDER ARTHROSCOPY WITH DEBRIDEMENT ROTATOR CUFF,SUBACROMIAL DECOMPRESSION AND POSSIBLE OPEN TENODESIS   WISDOM TOOTH EXTRACTION      reports that he quit smoking about 10 years ago. His smoking use included cigars. He has quit using smokeless tobacco. He reports current alcohol use. He reports that he does not use drugs. family history includes Cancer in his maternal grandfather; Diabetes in his father, mother, and another family member; Heart disease in his maternal grandmother; Hypertension in his father, mother, and another family member. Allergies  Allergen Reactions   Codeine Itching and Other (See Comments)   Current Outpatient Medications on File Prior to Visit  Medication Sig Dispense Refill   ALPRAZolam  (XANAX ) 1 MG  tablet TAKE 1 TABLET BY MOUTH TWICE A DAY AS NEEDED 60 tablet 2   amLODipine  (NORVASC ) 10 MG tablet Take 1 tablet (10 mg total) by mouth daily. 90 tablet 3   aspirin  EC 81 MG tablet Take 81 mg by mouth every evening.     atorvastatin  (LIPITOR ) 80 MG tablet Take 1 tablet (80 mg total) by mouth daily. 90 tablet 3   Blood Glucose Calibration (ACCU-CHEK GUIDE CONTROL) LIQD Use as directed once daily as needed E11.9 1 each 2   Blood Glucose Monitoring Suppl  (ACCU-CHEK GUIDE) w/Device KIT Use as directed four times per day E11.9 1 kit 0   carisoprodol  (SOMA ) 350 MG tablet TAKE 1 TABLET BY MOUTH UP TO 3 TIMES A DAY AS NEEDED AS DIRECTED 90 tablet 3   Cholecalciferol  50 MCG (2000 UT) TABS 1 tab by mouth once daily 30 tablet 99   empagliflozin  (JARDIANCE ) 25 MG TABS tablet Take 1 tablet (25 mg total) by mouth daily before breakfast. 90 tablet 3   ezetimibe  (ZETIA ) 10 MG tablet TAKE 1 TABLET BY MOUTH EVERY DAY 15 tablet 0   gabapentin  (NEURONTIN ) 300 MG capsule TAKE 1 CAPSULE BY MOUTH EVERYDAY AT BEDTIME 90 capsule 1   glucose blood (ACCU-CHEK GUIDE) test strip Use as instructed four times per day E11.9 400 each 12   losartan (COZAAR) 100 MG tablet TAKE 1 TABLET BY MOUTH EVERY DAY 90 tablet 3   metFORMIN  (GLUCOPHAGE ) 500 MG tablet TAKE 2 TABLETS BY MOUTH EVERY MORNING AND TAKE 2 TABLETS BY MOUTH EVERY EVENING 360 tablet 3   naproxen  (NAPROSYN ) 500 MG tablet Take 1 tablet (500 mg total) by mouth 2 (two) times daily with a meal. 30 tablet 0   pioglitazone  (ACTOS ) 45 MG tablet Take 1 tablet (45 mg total) by mouth daily. 90 tablet 3   predniSONE  (STERAPRED UNI-PAK 21 TAB) 10 MG (21) TBPK tablet 6 day taper; take as directed on package instructions 21 tablet 0   Roflumilast  (ZORYVE ) 0.3 % FOAM Apply 1 Application topically daily as needed. Apply thin amount aa under eyes for dryness 60 g 6   tacrolimus  (PROTOPIC ) 0.1 % ointment Apply thin layer to lips qd/bid as needed for chapped lips 100 g 3   tirzepatide  (MOUNJARO ) 12.5 MG/0.5ML Pen Inject 12.5 mg into the skin once a week. 6 mL 11   triamcinolone  (NASACORT ) 55 MCG/ACT AERO nasal inhaler Place 2 sprays into the nose daily. 3 each 3   triamcinolone  cream (KENALOG ) 0.1 % Apply 1 Application topically 3 (three) times daily. (Patient not taking: Reported on 03/25/2024) 45 g 0   No current facility-administered medications on file prior to visit.        ROS:  All others reviewed and negative.  Objective         PE:  BP 114/68   Pulse 86   Temp 98.3 F (36.8 C)   Ht 5' 11 (1.803 m)   Wt 216 lb (98 kg)   SpO2 97%   BMI 30.13 kg/m                 Constitutional: Pt appears in NAD               HENT: Head: NCAT.                Right Ear: External ear normal.                 Left Ear: External ear normal. Bilat tm's  with mild erythema.  Max sinus areas non tender.  Pharynx with mild erythema, no exudate               Eyes: . Pupils are equal, round, and reactive to light. Conjunctivae and EOM are normal               Nose: without d/c or deformity               Neck: Neck supple. Gross normal ROM               Cardiovascular: Normal rate and regular rhythm.                 Pulmonary/Chest: Effort normal and breath sounds without rales or wheezing.                Abd:  Soft, NT, ND, + BS, no organomegaly               Neurological: Pt is alert. At baseline orientation, motor grossly intact               Skin: Skin is warm. No rashes, no other new lesions, LE edema - none               Psychiatric: Pt behavior is normal without agitation   Micro: none  Cardiac tracings I have personally interpreted today:  none  Pertinent Radiological findings (summarize): none   Lab Results  Component Value Date   WBC 4.3 09/22/2023   HGB 15.6 09/22/2023   HCT 46.0 09/22/2023   PLT 232.0 09/22/2023   GLUCOSE 137 (H) 03/21/2024   CHOL 86 03/21/2024   TRIG 72.0 03/21/2024   HDL 38.50 (L) 03/21/2024   LDLDIRECT 75.0 09/10/2021   LDLCALC 33 03/21/2024   ALT 14 03/21/2024   AST 13 03/21/2024   NA 142 03/21/2024   K 4.2 03/21/2024   CL 106 03/21/2024   CREATININE 0.69 03/21/2024   BUN 11 03/21/2024   CO2 23 03/21/2024   TSH 1.37 09/22/2023   PSA 0.56 09/22/2023   HGBA1C 6.4 03/21/2024   MICROALBUR <0.7 09/22/2023   Assessment/Plan:  Jeffrey [name] is a 58 y.o. White or Caucasian [1] male with  has a past medical history of Allergy, ANXIETY (07/05/2007), Arthritis (08/2015), Basal cell  carcinoma (2010), Cancer (HCC), CARPAL TUNNEL SYNDROME, BILATERAL (07/05/2007), Cervicalgia (03/18/2008), DEPRESSION (07/05/2007), DIABETES MELLITUS, TYPE II (07/05/2007), Dysplastic nevus (02/13/2024), ERECTILE DYSFUNCTION (07/11/2007), GERD (07/11/2007), HYPERLIPIDEMIA (07/05/2007), HYPERTENSION (07/05/2007), LOW BACK PAIN (07/11/2007), Meralgia paresthetica (09/25/2008), PEPTIC ULCER DISEASE (07/05/2007), SINUSITIS- ACUTE-NOS (03/26/2009), and SKIN LESION (03/18/2008).  Vitamin D  deficiency Last vitamin D  Lab Results  Component Value Date   VD25OH 33.46 03/21/2024   Low, pt to double up on oral replacement   Hypertension associated with type 2 diabetes mellitus (HCC) BP Readings from Last 3 Encounters:  03/25/24 114/68  09/25/23 120/80  03/24/23 120/70   Stable, pt to continue medical treatment losartan 100 mg every day, norvasc  10 qd   Hyperlipidemia Lab Results  Component Value Date   LDLCALC 33 03/21/2024   Stable, pt to continue current statin lipitor  80 every day, zetia  10 qd   Diabetes (HCC) Lab Results  Component Value Date   HGBA1C 6.4 03/21/2024   Stable, pt to continue current medical treatment jardiance  25 every day, metformin  1000 bid, actos  45 mg every day, and continue mounjaro  12.5 mg weekly   Allergic rhinitis Mild to mod, for restart nasacort  asd, claritin 10  every day prn,  to f/u any worsening symptoms or concerns  Followup: Return in about 6 months (around 09/25/2024).  Jeffrey Rush, MD 03/25/2024 12:15 PM [facility]

## 2024-03-25 NOTE — Assessment & Plan Note (Signed)
 Lab Results  Component Value Date   HGBA1C 6.4 03/21/2024   Stable, pt to continue current medical treatment jardiance  25 every day, metformin  1000 bid, actos  45 mg every day, and continue mounjaro  12.5 mg weekly

## 2024-03-25 NOTE — Assessment & Plan Note (Signed)
 BP Readings from Last 3 Encounters:  03/25/24 114/68  09/25/23 120/80  03/24/23 120/70   Stable, pt to continue medical treatment losartan 100 mg every day, norvasc  10 qd

## 2024-03-25 NOTE — Assessment & Plan Note (Signed)
 Lab Results  Component Value Date   LDLCALC 33 03/21/2024   Stable, pt to continue current statin lipitor  80 every day, zetia  10 qd

## 2024-03-25 NOTE — Assessment & Plan Note (Signed)
 Mild to mod, for restart nasacort  asd, claritin 10 every day prn,  to f/u any worsening symptoms or concerns

## 2024-03-25 NOTE — Assessment & Plan Note (Signed)
 Last vitamin D  Lab Results  Component Value Date   VD25OH 33.46 03/21/2024   Low, pt to double up on oral replacement

## 2024-03-29 ENCOUNTER — Other Ambulatory Visit: Payer: Self-pay | Admitting: Internal Medicine

## 2024-04-04 ENCOUNTER — Other Ambulatory Visit: Payer: Self-pay | Admitting: Internal Medicine

## 2024-04-05 ENCOUNTER — Other Ambulatory Visit: Payer: Self-pay | Admitting: Internal Medicine

## 2024-04-05 DIAGNOSIS — E7800 Pure hypercholesterolemia, unspecified: Secondary | ICD-10-CM

## 2024-04-05 DIAGNOSIS — E1165 Type 2 diabetes mellitus with hyperglycemia: Secondary | ICD-10-CM

## 2024-05-06 ENCOUNTER — Other Ambulatory Visit: Payer: Self-pay | Admitting: Internal Medicine

## 2024-05-13 ENCOUNTER — Other Ambulatory Visit: Payer: Self-pay | Admitting: Internal Medicine

## 2024-06-18 ENCOUNTER — Other Ambulatory Visit: Payer: Self-pay | Admitting: Internal Medicine

## 2024-06-19 ENCOUNTER — Other Ambulatory Visit: Payer: Self-pay | Admitting: Internal Medicine

## 2024-06-20 LAB — OPHTHALMOLOGY REPORT-SCANNED

## 2024-06-21 NOTE — Telephone Encounter (Signed)
 Pt of Dr. Santo. Passed 3rd attempt. Does Dr. Santo want to refill? Please advise.

## 2024-07-08 ENCOUNTER — Encounter (HOSPITAL_BASED_OUTPATIENT_CLINIC_OR_DEPARTMENT_OTHER): Payer: Self-pay

## 2024-07-09 ENCOUNTER — Ambulatory Visit: Attending: Internal Medicine | Admitting: Internal Medicine

## 2024-07-09 VITALS — BP 118/72 | HR 80 | Ht 71.0 in | Wt 210.0 lb

## 2024-07-09 DIAGNOSIS — E782 Mixed hyperlipidemia: Secondary | ICD-10-CM

## 2024-07-09 DIAGNOSIS — R011 Cardiac murmur, unspecified: Secondary | ICD-10-CM

## 2024-07-09 DIAGNOSIS — I10 Essential (primary) hypertension: Secondary | ICD-10-CM

## 2024-07-09 DIAGNOSIS — I700 Atherosclerosis of aorta: Secondary | ICD-10-CM | POA: Diagnosis not present

## 2024-07-09 DIAGNOSIS — R931 Abnormal findings on diagnostic imaging of heart and coronary circulation: Secondary | ICD-10-CM

## 2024-07-09 NOTE — Patient Instructions (Signed)
 Medication Instructions:  STOP zetia  10mg  daily  *If you need a refill on your cardiac medications before your next appointment, please call your pharmacy*  Lab Work: FASTING labs in 3 months to assess cholesterol  If you have labs (blood work) drawn today and your tests are completely normal, you will receive your results only by: MyChart Message (if you have MyChart) OR A paper copy in the mail If you have any lab test that is abnormal or we need to change your treatment, we will call you to review the results.  Testing/Procedures: Will consider monitor if palpitations recur/worsen Catha is an option for at-home monitoring  Follow-Up: At [facility], you and your health needs are our priority.  As part of our continuing mission to provide you with exceptional heart care, our providers are all part of one team.  This team includes your primary Cardiologist (physician) and Advanced Practice Providers or APPs (Physician Assistants and Nurse Practitioners) who all work together to provide you with the care you need, when you need it.  Your next appointment:   12 month(s)  Provider:   Stanly DELENA Leavens, MD    We recommend signing up for the patient portal called MyChart.  Sign up information is provided on this After Visit Summary.  MyChart is used to connect with patients for Virtual Visits (Telemedicine).  Patients are able to view lab/test results, encounter notes, upcoming appointments, etc.  Non-urgent messages can be sent to your provider as well.   To learn more about what you can do with MyChart, go to [email or web address].

## 2024-07-09 NOTE — Progress Notes (Signed)
 Cardiology Office Note:    Date:  07/09/2024   ID:  STALEY LUNZ, DOB 1966/02/25, MRN [id number]  PCP:  Norleen Lynwood ORN, MD   [facility] Providers Cardiologist:  Stanly DELENA Leavens, MD     Referring MD: Norleen Lynwood ORN, MD   CC: Secondary Prevention  History of Present Illness:    Jeffrey [name] is a 58 y.o. male with a hx of HTN, HLD elevated CAC, Aortic Valve calcification  and DM, active smoking who presents for evaluation.  2023: He has two sisters.  He had an echo that showed a septal thickness of 13 mm.  His sister has a septal thickness nearing 15 mm.  Stopped smoking after our last visit.  Had negative stress test. 2024: his sisters MRI did not meet diagnostic criteria for HCM.  We did not elect to test Jeffrey [name] at this time.  Jeffrey [name] is a 58 yo M with hypertension, hyperlipidemia, and aortic valve calcification who presents for follow-up of his cardiovascular health.  He has a history of hypertension, hyperlipidemia, and aortic valve calcification. He underwent an extensive evaluation for hypertrophic cardiomyopathy due to a family history of the condition, but he did not have hypertrophic cardiomyopathy. His septal thickness was measured at 13 millimeters. He had a stress test in 2023 that was negative for ischemia.  He has successfully stopped smoking and lost significant weight with the help of [employer] . His blood pressure is well controlled, and his coronary artery calcifications and LDL levels have improved. He is currently taking amlodipine  10 mg, aspirin  for coronary artery disease, and atorvastatin  80 mg.  He feels well overall, with occasional heartburn that occurs infrequently, about once in the past month. No chest pain or syncope. He describes a sensation of a 'funny heartbeat' that occurs rarely, with no episodes in the past week and only one in the past month.  Past Medical History:  Diagnosis Date   Allergy    ANXIETY 07/05/2007   Arthritis  08/2015   neck and back   Basal cell carcinoma 2010   R clavicle treated in [facility]   Cancer [facility])    basal cell   CARPAL TUNNEL SYNDROME, BILATERAL 07/05/2007   Cervicalgia 03/18/2008   DEPRESSION 07/05/2007   DIABETES MELLITUS, TYPE II 07/05/2007   Dysplastic nevus 02/13/2024   left mid to low back lateral - moderate - recheck at next follow up   ERECTILE DYSFUNCTION 07/11/2007   GERD 07/11/2007   HYPERLIPIDEMIA 07/05/2007   HYPERTENSION 07/05/2007   LOW BACK PAIN 07/11/2007   Meralgia paresthetica 09/25/2008   PEPTIC ULCER DISEASE 07/05/2007   SINUSITIS- ACUTE-NOS 03/26/2009   SKIN LESION 03/18/2008    Past Surgical History:  Procedure Laterality Date   ANTERIOR CERVICAL DECOMP/DISCECTOMY FUSION N/A 01/16/2020   Procedure: Cervical 4-5 Cervical 5-6 Cervical 6-7 Anterior cervical decompression/discectomy/fusion;  Surgeon: Unice Pac, MD;  Location: [facility] OR;  Service: Neurosurgery;  Laterality: N/A;  3C   BASAL CELL CARCINOMA EXCISION Right 2011   shoulder area   CARPAL TUNNEL RELEASE Left 01/16/2020   Procedure: LEFT CARPAL TUNNEL RELEASE;  Surgeon: Unice Pac, MD;  Location: [facility] OR;  Service: Neurosurgery;  Laterality: Left;  3C   CARPAL TUNNEL RELEASE Right 02/25/2020   Procedure: RIGHT CARPAL TUNNEL RELEASE;  Surgeon: Unice Pac, MD;  Location: [facility] OR;  Service: Neurosurgery;  Laterality: Right;   FRACTURE SURGERY     left wrist surgury/fracture  1985   SHOULDER ARTHROSCOPY WITH SUBACROMIAL DECOMPRESSION Left 03/30/2017  Procedure: SHOULDER ARTHROSCOPY WITH DEBRIDEMENT ROTATOR CUFF,SUBACROMIAL DECOMPRESSION AND OPEN TENODESIS;  Surgeon: Dozier Soulier, MD;  Location: [facility] OR;  Service: Orthopedics;  Laterality: Left;  SHOULDER ARTHROSCOPY WITH DEBRIDEMENT ROTATOR CUFF,SUBACROMIAL DECOMPRESSION AND POSSIBLE OPEN TENODESIS   WISDOM TOOTH EXTRACTION      Current Medications: Current Meds  Medication Sig   ALPRAZolam  (XANAX ) 1 MG tablet TAKE 1 TABLET BY MOUTH TWICE A  DAY AS NEEDED   amLODipine  (NORVASC ) 10 MG tablet TAKE 1 TABLET BY MOUTH EVERY DAY   aspirin  EC 81 MG tablet Take 81 mg by mouth every evening.   atorvastatin  (LIPITOR ) 80 MG tablet TAKE 1 TABLET BY MOUTH EVERY DAY   Blood Glucose Calibration (ACCU-CHEK GUIDE CONTROL) LIQD Use as directed once daily as needed E11.9   Blood Glucose Monitoring Suppl (ACCU-CHEK GUIDE) w/Device KIT Use as directed four times per day E11.9   carisoprodol  (SOMA ) 350 MG tablet TAKE 1 TABLET BY MOUTH UP TO 3 TIMES A DAY AS NEEDED AS DIRECTED   Cholecalciferol  50 MCG (2000 UT) TABS 1 tab by mouth once daily   empagliflozin  (JARDIANCE ) 25 MG TABS tablet Take 1 tablet (25 mg total) by mouth daily before breakfast.   ezetimibe  (ZETIA ) 10 MG tablet TAKE 1 TABLET (10 MG TOTAL) BY MOUTH DAILY. PLEASE CALL [phone] TO SCHEDULE AN OVERDUE APPOINTMENT FOR FUTURE REFILLS. THANK YOU. FINAL ATTEMPT   gabapentin  (NEURONTIN ) 300 MG capsule TAKE 1 CAPSULE BY MOUTH EVERYDAY AT BEDTIME   glucose blood (ACCU-CHEK GUIDE) test strip Use as instructed four times per day E11.9   losartan (COZAAR) 100 MG tablet TAKE 1 TABLET BY MOUTH EVERY DAY   metFORMIN  (GLUCOPHAGE ) 500 MG tablet TAKE 2 TABLETS BY MOUTH EVERY MORNING AND TAKE 2 TABLETS BY MOUTH EVERY EVENING   pioglitazone  (ACTOS ) 45 MG tablet TAKE 1 TABLET BY MOUTH EVERY DAY   Roflumilast  (ZORYVE ) 0.3 % FOAM Apply 1 Application topically daily as needed. Apply thin amount aa under eyes for dryness   tacrolimus  (PROTOPIC ) 0.1 % ointment Apply thin layer to lips qd/bid as needed for chapped lips   tirzepatide  (MOUNJARO ) 12.5 MG/0.5ML Pen Inject 12.5 mg into the skin once a week.     Allergies:   Codeine   Social History   Socioeconomic History   Marital status: Married    Spouse name: Not on file   Number of children: 2   Years of education: Not on file   Highest education level: GED or equivalent  Occupational History   Occupation: merchandiser, retail for [employer]  Tobacco Use   Smoking status: Former    Types: Cigars    Quit date: 10/31/2013    Years since quitting: 10.6   Smokeless tobacco: Former   Tobacco comments:    3 cigars/ day  Vaping Use   Vaping status: Former  Substance and Sexual Activity   Alcohol use: Yes    Alcohol/week: 0.0 standard drinks of alcohol    Comment: social; weekends 6 beers   Drug use: No   Sexual activity: Not on file  Other Topics Concern   Not on file  Social History Narrative   Not on file   Social Drivers of Health   Financial Resource Strain: Low Risk  (03/21/2024)   Overall Financial Resource Strain (CARDIA)    Difficulty of Paying Living Expenses: Not very hard  Food Insecurity: No Food Insecurity (03/21/2024)   Hunger Vital Sign    Worried About Running Out of Food in the Last  Year: Never true    Ran Out of Food in the Last Year: Never true  Transportation Needs: No Transportation Needs (03/21/2024)   PRAPARE - Administrator, Civil Service (Medical): No    Lack of Transportation (Non-Medical): No  Physical Activity: Inactive (03/21/2024)   Exercise Vital Sign    Days of Exercise per Week: 4 days    Minutes of Exercise per Session: 0 min  Stress: No Stress Concern Present (03/21/2024)   [employer] of Occupational Health - Occupational Stress Questionnaire    Feeling of Stress: Not at all  Social Connections: Moderately Isolated (03/21/2024)   Social Connection and Isolation Panel    Frequency of Communication with Friends and Family: More than three times a week    Frequency of Social Gatherings with Friends and Family: Once a week    Attends Religious Services: Never    Database Administrator or Organizations: No    Attends Engineer, Structural: Not on file    Marital Status: Married    Social: Works teaching laboratory technician, maintenance, and teaching laboratory technician and four grandchildren, married 32  Has three sisters, one biological daugther Father had MI 2023 and had HF and ESRD,  passed away  Family History: The patient's family history includes Cancer in his maternal grandfather; Diabetes in his father, mother, and another family member; Heart disease in his maternal grandmother; Hypertension in his father, mother, and another family member. There is no history of Colon cancer.  ROS:   Please see the history of present illness.     All other systems reviewed and are negative.  EKGs/Labs/Other Studies Reviewed:    The following studies were reviewed today:  EKG:   10/27/21: SR rate 86   Cardiac Studies & Procedures   ______________________________________________________________________________________________   STRESS TESTS  MYOCARDIAL PERFUSION IMAGING 07/05/2022  Interpretation Summary   No ST deviation was noted. The ECG was not diagnostic due to pharmacologic protocol.   A pharmacological stress test was performed using IV Lexiscan  0.4mg  over 10 seconds performed without concurrent submaximal exercise. Normal blood pressure and normal heart rate response noted during stress.   Left ventricular function is normal. Nuclear stress EF: 64 %. The left ventricular ejection fraction is normal (55-65%). End diastolic cavity size is normal. End systolic cavity size is normal.   The study is normal. The study is low risk.   Prior study not available for comparison.   ECHOCARDIOGRAM  ECHOCARDIOGRAM COMPLETE 11/15/2021  Narrative ECHOCARDIOGRAM REPORT    Patient Name:   Jeffrey [name] Date of Exam: 11/15/2021 Medical Rec #:  [id number]         Height:       71.0 in Accession #:    [id number]        Weight:       254.0 lb Date of Birth:  Sep 26, 1965         BSA:          2.334 m Patient Age:    58 years          BP:           157/88 mmHg Patient Gender: M                 HR:           78 bpm. Exam Location:  [address]  Procedure: 2D Echo, 3D Echo, Cardiac Doppler, Color Doppler and Strain Analysis  Indications:    R01.1 Murmur  History:  Patient has no prior history of Echocardiogram examinations. Signs/Symptoms:Murmur; Risk Factors:Hypertension, Dyslipidemia, Diabetes, Current Smoker and Morbid obesity.  Sonographer:    Marshia Lawyer [facility], RDCS Referring Phys: [id number] [facility] Vedansh Kerstetter  IMPRESSIONS   1. Left ventricular ejection fraction, by estimation, is 60 to 65%. Left ventricular ejection fraction by 3D volume is 59 %. The left ventricle has normal function. The left ventricle has no regional wall motion abnormalities. There is mild left ventricular hypertrophy of the basal-septal segment. Left ventricular diastolic parameters were normal. The average left ventricular global longitudinal strain is -22.3 %. The global longitudinal strain is normal. 2. Right ventricular systolic function is normal. The right ventricular size is mildly enlarged. Tricuspid regurgitation signal is inadequate for assessing PA pressure. 3. The mitral valve is normal in structure. No evidence of mitral valve regurgitation. No evidence of mitral stenosis. 4. The aortic valve is tricuspid. Aortic valve regurgitation is not visualized. Aortic valve sclerosis/calcification is present, without any evidence of aortic stenosis. 5. Aortic dilatation noted. There is mild dilatation of the aortic root, measuring 38 mm. 6. The inferior vena cava is normal in size with greater than 50% respiratory variability, suggesting right atrial pressure of 3 mmHg.  FINDINGS Left Ventricle: Left ventricular ejection fraction, by estimation, is 60 to 65%. Left ventricular ejection fraction by 3D volume is 59 %. The left ventricle has normal function. The left ventricle has no regional wall motion abnormalities. The average left ventricular global longitudinal strain is -22.3 %. The global longitudinal strain is normal. The left ventricular internal cavity size was normal in size. There is mild left ventricular hypertrophy of the basal-septal segment. Left  ventricular diastolic parameters were normal. Normal left ventricular filling pressure.  Right Ventricle: The right ventricular size is mildly enlarged. No increase in right ventricular wall thickness. Right ventricular systolic function is normal. Tricuspid regurgitation signal is inadequate for assessing PA pressure.  Left Atrium: Left atrial size was normal in size.  Right Atrium: Right atrial size was normal in size.  Pericardium: There is no evidence of pericardial effusion.  Mitral Valve: The mitral valve is normal in structure. Mild mitral annular calcification. No evidence of mitral valve regurgitation. No evidence of mitral valve stenosis.  Tricuspid Valve: The tricuspid valve is normal in structure. Tricuspid valve regurgitation is not demonstrated. No evidence of tricuspid stenosis.  Aortic Valve: The aortic valve is tricuspid. Aortic valve regurgitation is not visualized. Aortic valve sclerosis/calcification is present, without any evidence of aortic stenosis.  Pulmonic Valve: The pulmonic valve was normal in structure. Pulmonic valve regurgitation is not visualized. No evidence of pulmonic stenosis.  Aorta: Aortic dilatation noted. There is mild dilatation of the aortic root, measuring 38 mm.  Venous: The inferior vena cava is normal in size with greater than 50% respiratory variability, suggesting right atrial pressure of 3 mmHg.  IAS/Shunts: No atrial level shunt detected by color flow Doppler.   LEFT VENTRICLE PLAX 2D LVIDd:         4.30 cm         Diastology LVIDs:         2.70 cm         LV e' medial:    9.51 cm/s LV PW:         1.10 cm         LV E/e' medial:  13.0 LV IVS:        1.20 cm         LV e' lateral:  10.80 cm/s LVOT diam:     2.30 cm         LV E/e' lateral: 11.5 LV SV:         113 LV SV Index:   49              2D LVOT Area:     4.15 cm        Longitudinal Strain 2D Strain GLS  -21.9 % (A2C): 2D Strain GLS  -23.5 % (A3C): 2D Strain GLS  -21.4  % (A4C): 2D Strain GLS  -22.3 % Avg:  3D Volume EF LV 3D EF:    Left ventricul ar ejection fraction by 3D volume is 59 %.  3D Volume EF: 3D EF:        59 % LV EDV:       112 ml LV ESV:       46 ml LV SV:        66 ml  RIGHT VENTRICLE             IVC RV Basal diam:  4.00 cm     IVC diam: 2.00 cm RV S prime:     16.40 cm/s TAPSE (M-mode): 2.4 cm  LEFT ATRIUM             Index        RIGHT ATRIUM           Index LA diam:        3.80 cm 1.63 cm/m   RA Pressure: 8.00 mmHg LA Vol (A2C):   40.6 ml 17.39 ml/m  RA Area:     16.40 cm LA Vol (A4C):   44.1 ml 18.89 ml/m  RA Volume:   40.60 ml  17.39 ml/m LA Biplane Vol: 45.2 ml 19.37 ml/m AORTIC VALVE LVOT Vmax:   128.00 cm/s LVOT Vmean:  88.600 cm/s LVOT VTI:    0.273 m  AORTA Ao Root diam: 3.80 cm Ao Asc diam:  3.50 cm  MITRAL VALVE                TRICUSPID VALVE Estimated RAP:  8.00 mmHg MV Decel Time: 162 msec MV E velocity: 124.00 cm/s  SHUNTS MV A velocity: 94.30 cm/s   Systemic VTI:  0.27 m MV E/A ratio:  1.31         Systemic Diam: 2.30 cm  Wilbert Bihari MD Electronically signed by Wilbert Bihari MD Signature Date/Time: 11/15/2021/11:38:49 AM    Final      CT SCANS  CT CARDIAC SCORING (SELF PAY ONLY) 10/25/2021  Addendum 10/25/2021  1:08 PM ADDENDUM REPORT: 10/25/2021 13:05  CLINICAL DATA:  Cardiovascular Disease Risk stratification  EXAM: Coronary Calcium  Score  TECHNIQUE: A gated, non-contrast computed tomography scan of the heart was performed using 3mm slice thickness. Axial images were analyzed on a dedicated workstation. Calcium  scoring of the coronary arteries was performed using the Agatston method.  FINDINGS: Coronary arteries: Normal origins.  Coronary Calcium  Score:  Left main: 0  Left anterior descending artery: 497  Left circumflex artery: 101  Right coronary artery: 20  Total: 619  Percentile: 96  Pericardium: Normal.  Ascending Aorta: Normal  caliber.  Non-cardiac: See separate report from [facility].  IMPRESSION: Coronary calcium  score of 619. This was 50 percentile for age-, race-, and sex-matched controls.  RECOMMENDATIONS: Coronary artery calcium  (CAC) score is a strong predictor of incident coronary heart disease (CHD) and provides predictive information beyond traditional risk factors. CAC scoring is reasonable to  use in the decision to withhold, postpone, or initiate statin therapy in intermediate-risk or selected borderline-risk asymptomatic adults (age 74-75 years and LDL-C >=70 to <190 mg/dL) who do not have diabetes or established atherosclerotic cardiovascular disease (ASCVD).* In intermediate-risk (10-year ASCVD risk >=7.5% to <20%) adults or selected borderline-risk (10-year ASCVD risk >=5% to <7.5%) adults in whom a CAC score is measured for the purpose of making a treatment decision the following recommendations have been made:  If CAC=0, it is reasonable to withhold statin therapy and reassess in 5 to 10 years, as long as higher risk conditions are absent (diabetes mellitus, family history of premature CHD in first degree relatives (males <55 years; females <65 years), cigarette smoking, or LDL >=190 mg/dL).  If CAC is 1 to 99, it is reasonable to initiate statin therapy for patients >=83 years of age.  If CAC is >=100 or >=75th percentile, it is reasonable to initiate statin therapy at any age.  Cardiology referral should be considered for patients with CAC scores >=400 or >=75th percentile.  *2018 AHA/ACC/AACVPR/AAPA/ABC/ACPM/ADA/AGS/APhA/ASPC/NLA/PCNA Guideline on the Management of Blood Cholesterol: A Report of the American College of Cardiology/American Heart Association Task Force on Clinical Practice Guidelines. J Am Coll Cardiol. 2019;73(24):3168-3209.   Electronically Signed By: Oneil Parchment M.D. On: 10/25/2021 13:05  Narrative EXAM: OVER-READ INTERPRETATION  CT  CHEST  The following report is an over-read performed by radiologist Dr. Marcey Moan of [facility] on 10/25/2021. This over-read does not include interpretation of cardiac or coronary anatomy or pathology. The coronary calcium  score interpretation by the cardiologist is attached.  COMPARISON:  None.  FINDINGS: Vascular: Mild atherosclerosis of the visualized thoracic aorta.  Mediastinum/Nodes: Visualized mediastinum and hilar regions demonstrate no lymphadenopathy or masses.  Lungs/Pleura: Visualized lungs show no evidence of pulmonary edema, consolidation, pneumothorax, nodule or pleural fluid.  Upper Abdomen: No acute abnormality.  Musculoskeletal: No chest wall mass or suspicious bone lesions identified.  IMPRESSION: Mild atherosclerosis of the thoracic aorta.  Electronically Signed: By: Marcey Moan M.D. On: 10/25/2021 10:48     ______________________________________________________________________________________________       Recent Labs: 09/22/2023: Hemoglobin 15.6; Platelets 232.0; TSH 1.37 03/21/2024: ALT 14; BUN 11; Creatinine, Ser 0.69; Potassium 4.2; Sodium 142  Recent Lipid Panel    Component Value Date/Time   CHOL 86 03/21/2024 1159   CHOL 113 01/31/2022 0901   TRIG 72.0 03/21/2024 1159   TRIG 213 (HH) 08/28/2006 1631   HDL 38.50 (L) 03/21/2024 1159   HDL 40 01/31/2022 0901   CHOLHDL 2 03/21/2024 1159   VLDL 14.4 03/21/2024 1159   LDLCALC 33 03/21/2024 1159   LDLCALC 58 01/31/2022 0901   LDLCALC 96 03/09/2020 1108   LDLDIRECT 75.0 09/10/2021 0948       Physical Exam:    VS:  BP 118/72   Pulse 80   Ht 5' 11 (1.803 m)   Wt 210 lb (95.3 kg)   SpO2 95%   BMI 29.29 kg/m     Wt Readings from Last 3 Encounters:  07/09/24 210 lb (95.3 kg)  03/25/24 216 lb (98 kg)  09/25/23 236 lb (107 kg)    Gen: No distress Neck: No JVD Ears: Bilateral Dempsey Sign Cardiac: No Rubs or Gallops, systolic murmur , RRR +2 radial  pulses Respiratory: Clear to auscultation bilaterally, normal effort, normal  respiratory rate GI: Soft, nontender, non-distended  MS: No edema; moves all extremities Integument: Skin feels well Neuro:  At time of evaluation, alert and oriented to person/place/time/situation  Psych: Normal affect, patient feels ok   ASSESSMENT/PLAN:    Aortic valve calcification with murmur, without stenosis Aortic valve calcification present without aortic stenosis. Asymptomatic with normal strain imaging and good cardiac pressure. No immediate intervention required. - Will repeat echocardiogram to monitor aortic valve calcification.  Aortic atherosclerosis with mild coronary artery calcification Mild coronary artery calcification with aortic atherosclerosis. Negative stress test in 2023. Cholesterol levels improved with current therapy. Decision to discontinue ezetimibe  due to improved cholesterol levels. - Discontinued ezetimibe . Continue other therapy - Will recheck fasting lipids and ALT in three months.  Essential hypertension, well controlled Blood pressure well controlled with current medication regimen. Significant weight loss contributing to improved blood pressure control. - Continue current antihypertensive regimen.  Mixed hyperlipidemia, improved on therapy Cholesterol levels improved with atorvastatin  therapy. Decision to discontinue ezetimibe  due to improved cholesterol levels. - Discontinued ezetimibe . - Will recheck fasting lipids and ALT in three months.  Infrequent palpitations, under evaluation Infrequent palpitations reported, occurring once in the past month. No associated chest pain or syncope. Differential includes premature atrial or ventricular contractions. Decision to monitor symptoms and consider heart monitor if palpitations increase in frequency. - Monitor palpitations and report if frequency increases. - Will consider heart monitor if palpitations become more  frequent.  Moderate septal hypertrophy without hypertrophic cardiomyopathy Moderate septal hypertrophy noted, not meeting criteria for hypertrophic cardiomyopathy. Normal strain imaging. No further intervention required at this time.  Completed HCM w/u in 2024  One year with me  Longitudinal care: The evaluation and management services provided today reflect the complexity inherent in caring for this patient, including the ongoing longitudinal relationship and management of multiple chronic conditions and/or the need for care coordination. The visit required a comprehensive assessment and management plan tailored to the patient's unique needs Time was spent addressing not only the acute concerns but also the broader context of the patient's health, including preventive care, chronic disease management, and care coordination as appropriate.  Complex longitudinal is necessary for conditions including: Valve care with complex eval for HCM with family; no evidence of HCM in 2024 (family history criteria would be positive)        Stanly Leavens, MD FASE [facility] Cardiologist [facility]  [address] [place], [place] [address] [phone]  1:09 PM

## 2024-08-16 ENCOUNTER — Telehealth: Payer: Self-pay

## 2024-08-16 NOTE — Telephone Encounter (Signed)
 Copied from CRM [id number]. Topic: Clinical - Request for Lab/Test Order >> Aug 16, 2024 12:38 PM Victoria A wrote: Reason for CRM: Patient would like order for labs -said labs were scheduled for February but cancelled due to Dr.John retiring.

## 2024-09-17 ENCOUNTER — Other Ambulatory Visit: Payer: Self-pay | Admitting: Internal Medicine

## 2024-09-17 ENCOUNTER — Telehealth: Payer: Self-pay

## 2024-09-17 ENCOUNTER — Other Ambulatory Visit: Payer: Self-pay

## 2024-09-17 ENCOUNTER — Other Ambulatory Visit (HOSPITAL_COMMUNITY): Payer: Self-pay

## 2024-09-30 ENCOUNTER — Ambulatory Visit: Admitting: Internal Medicine

## 2024-12-26 ENCOUNTER — Encounter: Admitting: Nurse Practitioner

## 2025-02-13 ENCOUNTER — Ambulatory Visit: Admitting: Dermatology
